# Patient Record
Sex: Male | Born: 1955 | Race: White | Hispanic: No | Marital: Married | Smoking: Current every day smoker
Health system: Southern US, Community
[De-identification: ages and names within clinical notes are randomized; demographics above are authoritative.]

## PROBLEM LIST (undated history)

## (undated) DIAGNOSIS — IMO0001 Reserved for inherently not codable concepts without codable children: Secondary | ICD-10-CM

## (undated) DIAGNOSIS — K08109 Complete loss of teeth, unspecified cause, unspecified class: Secondary | ICD-10-CM

## (undated) DIAGNOSIS — I219 Acute myocardial infarction, unspecified: Secondary | ICD-10-CM

## (undated) DIAGNOSIS — I10 Essential (primary) hypertension: Secondary | ICD-10-CM

## (undated) DIAGNOSIS — Z972 Presence of dental prosthetic device (complete) (partial): Secondary | ICD-10-CM

## (undated) DIAGNOSIS — I639 Cerebral infarction, unspecified: Secondary | ICD-10-CM

## (undated) DIAGNOSIS — I2510 Atherosclerotic heart disease of native coronary artery without angina pectoris: Secondary | ICD-10-CM

## (undated) DIAGNOSIS — Z66 Do not resuscitate: Secondary | ICD-10-CM

## (undated) DIAGNOSIS — J449 Chronic obstructive pulmonary disease, unspecified: Secondary | ICD-10-CM

## (undated) DIAGNOSIS — Z5112 Encounter for antineoplastic immunotherapy: Secondary | ICD-10-CM

## (undated) DIAGNOSIS — R911 Solitary pulmonary nodule: Secondary | ICD-10-CM

## (undated) HISTORY — PX: LUNG SURGERY: SHX703

## (undated) HISTORY — DX: Encounter for antineoplastic immunotherapy: Z51.12

## (undated) HISTORY — PX: CAROTID ENDARTERECTOMY: SUR193

## (undated) HISTORY — DX: Acute myocardial infarction, unspecified: I21.9

## (undated) HISTORY — DX: Do not resuscitate: Z66

## (undated) HISTORY — DX: Solitary pulmonary nodule: R91.1

## (undated) HISTORY — PX: ANGIOPLASTY: SHX39

---

## 1989-04-21 DIAGNOSIS — I219 Acute myocardial infarction, unspecified: Secondary | ICD-10-CM

## 1989-04-21 HISTORY — DX: Acute myocardial infarction, unspecified: I21.9

## 2000-05-07 ENCOUNTER — Ambulatory Visit (HOSPITAL_COMMUNITY): Admission: RE | Admit: 2000-05-07 | Discharge: 2000-05-07 | Payer: Self-pay | Admitting: *Deleted

## 2005-04-07 ENCOUNTER — Ambulatory Visit (HOSPITAL_COMMUNITY): Admission: RE | Admit: 2005-04-07 | Discharge: 2005-04-07 | Payer: Self-pay | Admitting: Family Medicine

## 2005-04-09 ENCOUNTER — Ambulatory Visit: Payer: Self-pay | Admitting: Internal Medicine

## 2005-05-28 ENCOUNTER — Ambulatory Visit: Payer: Self-pay | Admitting: Internal Medicine

## 2005-06-30 ENCOUNTER — Encounter (INDEPENDENT_AMBULATORY_CARE_PROVIDER_SITE_OTHER): Payer: Self-pay | Admitting: Specialist

## 2005-06-30 ENCOUNTER — Inpatient Hospital Stay (HOSPITAL_COMMUNITY): Admission: RE | Admit: 2005-06-30 | Discharge: 2005-07-03 | Payer: Self-pay | Admitting: Thoracic Surgery

## 2005-07-09 ENCOUNTER — Encounter: Admission: RE | Admit: 2005-07-09 | Discharge: 2005-07-09 | Payer: Self-pay | Admitting: Thoracic Surgery

## 2005-07-16 ENCOUNTER — Encounter: Admission: RE | Admit: 2005-07-16 | Discharge: 2005-07-16 | Payer: Self-pay | Admitting: Thoracic Surgery

## 2005-07-23 ENCOUNTER — Encounter: Admission: RE | Admit: 2005-07-23 | Discharge: 2005-07-23 | Payer: Self-pay | Admitting: Thoracic Surgery

## 2005-09-03 ENCOUNTER — Encounter: Admission: RE | Admit: 2005-09-03 | Discharge: 2005-09-03 | Payer: Self-pay | Admitting: Thoracic Surgery

## 2005-11-26 ENCOUNTER — Encounter: Admission: RE | Admit: 2005-11-26 | Discharge: 2005-11-26 | Payer: Self-pay | Admitting: Thoracic Surgery

## 2010-01-17 ENCOUNTER — Encounter: Payer: Self-pay | Admitting: Internal Medicine

## 2010-01-25 ENCOUNTER — Encounter: Admission: RE | Admit: 2010-01-25 | Discharge: 2010-01-25 | Payer: Self-pay | Admitting: Family Medicine

## 2010-02-11 DIAGNOSIS — G459 Transient cerebral ischemic attack, unspecified: Secondary | ICD-10-CM | POA: Insufficient documentation

## 2010-02-11 DIAGNOSIS — I10 Essential (primary) hypertension: Secondary | ICD-10-CM

## 2010-02-11 DIAGNOSIS — I2510 Atherosclerotic heart disease of native coronary artery without angina pectoris: Secondary | ICD-10-CM | POA: Insufficient documentation

## 2010-02-11 DIAGNOSIS — E119 Type 2 diabetes mellitus without complications: Secondary | ICD-10-CM | POA: Insufficient documentation

## 2010-02-12 ENCOUNTER — Ambulatory Visit: Payer: Self-pay | Admitting: Internal Medicine

## 2010-02-12 DIAGNOSIS — J449 Chronic obstructive pulmonary disease, unspecified: Secondary | ICD-10-CM | POA: Insufficient documentation

## 2010-02-12 DIAGNOSIS — F17200 Nicotine dependence, unspecified, uncomplicated: Secondary | ICD-10-CM | POA: Insufficient documentation

## 2010-02-12 DIAGNOSIS — R930 Abnormal findings on diagnostic imaging of skull and head, not elsewhere classified: Secondary | ICD-10-CM | POA: Insufficient documentation

## 2010-02-12 DIAGNOSIS — E785 Hyperlipidemia, unspecified: Secondary | ICD-10-CM

## 2010-02-12 DIAGNOSIS — R911 Solitary pulmonary nodule: Secondary | ICD-10-CM | POA: Insufficient documentation

## 2010-02-12 DIAGNOSIS — I6789 Other cerebrovascular disease: Secondary | ICD-10-CM | POA: Insufficient documentation

## 2010-02-27 ENCOUNTER — Encounter: Payer: Self-pay | Admitting: Cardiovascular Disease

## 2010-03-08 ENCOUNTER — Ambulatory Visit: Payer: Self-pay | Admitting: Cardiovascular Disease

## 2010-03-08 ENCOUNTER — Encounter: Payer: Self-pay | Admitting: Cardiology

## 2010-03-08 DIAGNOSIS — M79609 Pain in unspecified limb: Secondary | ICD-10-CM | POA: Insufficient documentation

## 2010-03-08 DIAGNOSIS — R9439 Abnormal result of other cardiovascular function study: Secondary | ICD-10-CM | POA: Insufficient documentation

## 2010-03-08 DIAGNOSIS — R55 Syncope and collapse: Secondary | ICD-10-CM

## 2010-03-08 DIAGNOSIS — I6529 Occlusion and stenosis of unspecified carotid artery: Secondary | ICD-10-CM

## 2010-03-08 DIAGNOSIS — I2510 Atherosclerotic heart disease of native coronary artery without angina pectoris: Secondary | ICD-10-CM

## 2010-03-12 ENCOUNTER — Encounter: Payer: Self-pay | Admitting: Cardiovascular Disease

## 2010-03-26 ENCOUNTER — Telehealth (INDEPENDENT_AMBULATORY_CARE_PROVIDER_SITE_OTHER): Payer: Self-pay | Admitting: *Deleted

## 2010-03-27 ENCOUNTER — Encounter: Payer: Self-pay | Admitting: Cardiology

## 2010-03-27 ENCOUNTER — Encounter (HOSPITAL_COMMUNITY)
Admission: RE | Admit: 2010-03-27 | Discharge: 2010-05-21 | Payer: Self-pay | Source: Home / Self Care | Attending: Cardiovascular Disease | Admitting: Cardiovascular Disease

## 2010-03-27 ENCOUNTER — Encounter: Payer: Self-pay | Admitting: *Deleted

## 2010-03-27 ENCOUNTER — Ambulatory Visit: Payer: Self-pay

## 2010-03-27 ENCOUNTER — Encounter: Payer: Self-pay | Admitting: Cardiovascular Disease

## 2010-03-29 ENCOUNTER — Telehealth: Payer: Self-pay | Admitting: Cardiovascular Disease

## 2010-04-08 ENCOUNTER — Ambulatory Visit: Payer: Self-pay | Admitting: Cardiovascular Disease

## 2010-05-11 ENCOUNTER — Encounter: Payer: Self-pay | Admitting: Family Medicine

## 2010-05-12 ENCOUNTER — Encounter: Payer: Self-pay | Admitting: Thoracic Surgery

## 2010-05-21 NOTE — Assessment & Plan Note (Signed)
Summary: Pulmonary/ new pt eval copd, still smoking > try spiriva    Copy to:  Dr. Vernon Prey Primary Provider/Referring Provider:  Dr. Vernon Prey  CC:  SOB.[facility]  History of Present Illness: 55 yowm still smoking c/o  tired after walk about a quarter of a mile  February 12, 2010  1st pulmonary office eval cc indolent onset persistent/ progressive but mild doe x 2 years but not limiting  in terms of adls and not using inhalers at all or having freqent flares of sob /cough or noct c/os.  Pt denies any significant sore throat, dysphagia, itching, sneezing,  nasal congestion or excess secretions,  fever, chills, sweats, unintended wt loss, pleuritic or exertional cp, hempoptysis, change in activity tolerance  orthopnea pnd or leg swelling Pt also denies any obvious fluctuation in symptoms with weather or environmental change or other alleviating or aggravating factors.       Current Medications (verified): 1)  Lipitor 40 Mg Tabs (Atorvastatin Calcium) .... 1/2 Once Daily 2)  Diovan 320 Mg Tabs (Valsartan) .... 1/2 Once Daily 3)  Aspir-Low 81 Mg Tbec (Aspirin) .... Take 1 Tablet By Mouth Once A Day 4)  Vitamin D3 50000 Unit Caps (Cholecalciferol) .... Once A Week  Allergies (verified): No Known Drug Allergies  Past History:  Past Medical History: C V A / Stroke Hyperlipidemia Hypertension COPD ...................................................Adam KitchenWert     - PFT's 05/28/05 FEV1 2.09 (66%) with ratio of 69 and no better p B2, with DLC0 69% = GOLD II     - Trial of spiriva February 12, 2010   Past Surgical History: Bowel surgery 1990 Left Disc surgery 1992 Left VATS and Left Lingulectomy March 2007- Per Dr Edwyna Shell    -  1. LEFT LINGULA, WEDGE BIOPSY: FOCAL INFLAMMATION ASSOCIATED   WITH FIBROSIS AND ABSCESS.  Family History:  Mother- she died age 73 with MI, lung cancer Father-asthma, emphysema, heart disease  Social History: Current smoker x 27yrs 2ppd ETOH  daily Married Soil scientist  Review of Systems       The patient complains of shortness of breath with activity, productive cough, tooth/dental problems, headaches, sneezing, and joint stiffness or pain.  The patient denies shortness of breath at rest, non-productive cough, coughing up blood, chest pain, irregular heartbeats, acid heartburn, indigestion, loss of appetite, weight change, abdominal pain, difficulty swallowing, sore throat, nasal congestion/difficulty breathing through nose, itching, ear ache, anxiety, depression, hand/feet swelling, rash, change in color of mucus, and fever.    Vital Signs:  Patient profile:   55 year old male Height:      69 inches Weight:      116 pounds BMI:     17.19 O2 Sat:      93 % on Room air Temp:     98.4 degrees F oral Pulse rate:   98 / minute BP sitting:   128 / 80  (right arm) Cuff size:   regular  Vitals Entered By: Carron Curie CMA (February 12, 2010 10:57 AM)  O2 Flow:  Room air  Physical Exam  Additional Exam:  wt 116 February 12, 2010 Thin chonically ill appearing ambulatory wm nad      HEENT mild turbinate edema.  Oropharynx no thrush or excess pnd or cobblestoning.  No JVD or cervical adenopathy. Mild accessory muscle hypertrophy. Trachea midline, nl thryroid. Chest was hyperinflated by percussion with diminished breath sounds and moderate increased exp time without wheeze. Hoover sign positive at mid inspiration. Regular rate and rhythm without murmur  gallop or rub or increase P2 or edema.  Abd: no hsm, nl excursion. Ext warm without cyanosis or clubbing.     CXR  Procedure date:  01/17/2010  Findings:      Chronic changes c/w ild, no acute / active dz  Impression & Recommendations:  Problem # 1:  COPD UNSPECIFIED (ICD-496)  GOLD II severity by previous pft's and likely progressive  Active smoking greatest concern, see problem#2  I spent extra time with the patient today explaining optimal dpi  technique.  This improved  from  75 - 90% p coaching     Problem # 2:  SMOKER (ICD-305.1)  I took this opportunity to educate the patient regarding the consequences of smoking in airway disorders based on all the data we have from the multiple national lung health studies indicating that smoking cessation, not choice of inhalers or physicians, is the most important aspect of care.     I emphasized that although we never turn away smokers from the pulmonary clinic, we do ask that they understand that the recommendations that were made won't work nearly as well in the presence of continued cigarette exposure and we may reach a point where we can't help the patient if he/she can't quit smoking.       Problem # 3:  ABNORMAL LUNG XRAY (ICD-793.1)  Previous bx reviewed, all this was inflammatory ? related to smoking or infection, no further w/u needed  Orders: New Patient Level V ([address]109)  Medications Added to Medication List This Visit: 1)  Aspir-low 81 Mg Tbec (Aspirin) .... Take 1 tablet by mouth once a day 2)  Vitamin D3 50000 Unit Caps (Cholecalciferol) .... Once a week 3)  Spiriva Handihaler 18 Mcg Caps (Tiotropium bromide monohydrate) .... Two puffs in handihaler daily  Patient Instructions: 1)  Spiriva 1 capsule each am - work on smooth deep breaths in 2)  If improved after 10 day sample fill the prescription. 3)  If not better stop spiriva and make appointment to see me in [place] with pft's the same day 4)  Consider committ to quit Prescriptions: SPIRIVA HANDIHALER 18 MCG  CAPS (TIOTROPIUM BROMIDE MONOHYDRATE) Two puffs in handihaler daily  #30 x 11   Entered and Authorized by:   Nyoka Cowden MD   Signed by:   Nyoka Cowden MD on 02/12/2010   Method used:   Electronically to        [facility] [phone]* (retail)       [address] [place], [place]  [address]       Ph: [phone] or [phone]       Fax: [phone]   RxID:    [id number]    Immunization History:  Influenza Immunization History:    Influenza:  historical (12/31/2009)

## 2010-05-21 NOTE — Assessment & Plan Note (Signed)
Summary: abnormal ett/mt   Visit Type:  new pt Referring Provider:  Dr. Vernon Prey Primary Provider:  Dr. Vernon Prey  CC:  shortness of breath and dizziness.  History of Present Illness: 55 yo WM with history of CAD s/p angioplasty in 1990s (? vessel), carotid artery disease, HTN, hyperlipidemia, CVA, COPD and ongoing tobacco abuse referred today for cardiac evaluation. He recently underwent a screening treadmill exercise stress test in Dr. Buel Ream office. He exercised for 5.5 minutes and had no chest pain. There was an IVCD with exercise. He is referred here today for furtther evaluation. He tells me that he has had no chest pain or SOB but he did have a syncopal episode last week.  He also tells me that Dr. Juanda Chance "pushed a blockage out of my heart artery" in the 90's.  I see no record of this in the computer record.   He also describes pain in both calf muscles with ambulation. Resolves with rest. He has prior right carotid enarterectomy per Dr. Hart Rochester. He had carotid dopplers last month per the patient.   Current Medications (verified): 1)  Lipitor 40 Mg Tabs (Atorvastatin Calcium) .... 1/2 Once Daily 2)  Diovan 320 Mg Tabs (Valsartan) .... 1/2 Once Daily 3)  Aspir-Low 81 Mg Tbec (Aspirin) .... Take 1 Tablet By Mouth Once A Day 4)  Vitamin D3 50000 Unit Caps (Cholecalciferol) .... Once A Week 5)  Spiriva Handihaler 18 Mcg  Caps (Tiotropium Bromide Monohydrate) .... Two Puffs in Handihaler Daily  Allergies (verified): No Known Drug Allergies  Past History:  Past Medical History: CVA in the 90s Hyperlipidemia Hypertension COPD ...................................................Marland KitchenWert     - PFT's 05/28/05 FEV1 2.09 (66%) with ratio of 69 and no better p B2, with DLC0 69% = GOLD II     - Trial of spiriva February 12, 2010  Tobacco  CAD-? angioplasty by Dr. Juanda Chance in 90s but no record of this in computer  Past Surgical History: Bowel surgery 1990 Left Disc surgery 1992 Left VATS and  Left Lingulectomy March 2007- Per Dr Edwyna Shell    -  1. LEFT LINGULA, WEDGE BIOPSY: FOCAL INFLAMMATION ASSOCIATED   WITH FIBROSIS AND ABSCESS. Right carotid endarterectomy  Family History: Reviewed history from 02/12/2010 and no changes required.  Mother- died age 56 with CVA, lung cancer Father-deceased, ? MI 2 brothers dead-one killed  by tree and one in car wreck 2 sisters alive. One with cancer.   Social History: Reviewed history from 02/12/2010 and no changes required. Current smoker x 38 yrs- 2ppd ETOH daily-4 beers per day Married, no children Logger No illicit drug use  Review of Systems       The patient complains of fainting and dizziness.  The patient denies fatigue, malaise, fever, weight gain/loss, vision loss, decreased hearing, hoarseness, chest pain, palpitations, shortness of breath, prolonged cough, wheezing, sleep apnea, coughing up blood, abdominal pain, blood in stool, nausea, vomiting, diarrhea, heartburn, incontinence, blood in urine, muscle weakness, joint pain, leg swelling, rash, skin lesions, headache, depression, anxiety, enlarged lymph nodes, easy bruising or bleeding, and environmental allergies.    Vital Signs:  Patient profile:   55 year old male Height:      69 inches Weight:      112.50 pounds BMI:     16.67 Pulse rate:   103 / minute BP sitting:   159 / 97  (left arm) Cuff size:   regular  Vitals Entered By: Caralee Ates CMA (March 08, 2010 10:20 AM)  Physical Exam  General:  General: Well developed, well nourished, NAD HEENT: OP clear, mucus membranes moist SKIN: warm, dry Neuro: No focal deficits Musculoskeletal: Muscle strength 5/5 all ext Psychiatric: Mood and affect normal Neck: No JVD, no carotid bruits, no thyromegaly, no lymphadenopathy. Lungs:Clear bilaterally, no wheezes, rhonci, crackles CV: RRR no murmurs, gallops rubs Abdomen: soft, NT, ND, BS present Extremities: No edema, pulses non-palpable bilateral DP/PT.      EKG  Procedure date:  03/08/2010  Findings:      NSR, rate 76 bpm. Normal EKG  Impression & Recommendations:  Problem # 1:  CAD, NATIVE VESSEL (ICD-414.01) ? prior CAD. Will attempt to get records. He has had no chest pain but recent sycnopal episodes and abnormal exercise stress test. Will arrange exercise stress myoview to exclude ischemia.   His updated medication list for this problem includes:    Aspir-low 81 Mg Tbec (Aspirin) .Marland Kitchen... Take 1 tablet by mouth once a day  Problem # 2:  SYNCOPE AND COLLAPSE (ICD-780.2) See above. Recent f/u carotid dopplers per Dr. Hart [place]. Will get stress test to exclude ischemia.   His updated medication list for this problem includes:    Aspir-low 81 Mg Tbec (Aspirin) .Marland Kitchen... Take 1 tablet by mouth once a day  Problem # 3:  CAROTID ARTERY STENOSIS, WITHOUT INFARCTION (ICD-433.10) Per [facility], Dr. Hart Rochester.   His updated medication list for this problem includes:    Aspir-low 81 Mg Tbec (Aspirin) .Marland Kitchen... Take 1 tablet by mouth once a day  Problem # 4:  LEG PAIN (ICD-729.5) Will order ABI.   Orders: Arterial Duplex Lower Extremity (Arterial Duplex Low)  Problem # 5:  SMOKER (ICD-305.1) Smoking cessation encouraged.   Problem # 6:  ABNORMAL CV (STRESS) TEST (ICD-794.39) See above.   Other Orders: EKG w/ Interpretation (93000) Nuclear Stress Test (Nuc Stress Test)  Patient Instructions: 1)  Your physician recommends that you schedule a follow-up appointment in: 3-4 weeks. 2)  Your physician recommends that you continue on your current medications as directed. Please refer to the Current Medication list given to you today. 3)  Your physician has requested that you have an ankle brachial index (ABI). During this test an ultrasound and blood pressure cuff are used to evaluate the arteries that supply the arms and legs with blood. Allow thirty minutes for this exam. There are no restrictions or special instructions. 4)  Your physician has  requested that you have an exercise stress myoview.  For further information please visit [email or web address]  Please follow instruction sheet, as given.

## 2010-05-21 NOTE — Progress Notes (Signed)
Summary: nuc pre procedure  Phone Note Outgoing Call Call back at Home Phone [phone]   Call placed by: Cathlyn Parsons RN,  March 26, 2010 4:26 PM Call placed to: Patient Summary of Call: Reviewed information on Myoview Information Sheet (see scanned document for further details).  Spoke with patient.      Nuclear Med Background Indications for Stress Test: Evaluation for Ischemia  Indications Comments: Recent Abn stress test  History: Angioplasty, COPD, GXT  History Comments: 90 PTCA Recent GXT-IVCD with exercise abn at Dr.Moore  Symptoms: Dizziness, DOE, Syncope    Nuclear Pre-Procedure Cardiac Risk Factors: Carotid Disease, CVA, Family History - CAD, Hypertension, Lipids, NIDDM, PVD, Smoker Height (in): 69   Appended Document: Cardiology Nuclear Testing Normal nuclear study. Can we call him? thanks, chris  Appended Document: Cardiology Nuclear Testing LVMTCB

## 2010-05-21 NOTE — Progress Notes (Signed)
Summary: pt rtn call  Phone Note Call from Patient Call back at Home Phone [phone]   Caller: Patient Reason for Call: Talk to Nurse, Talk to Doctor, Lab or Test Results Summary of Call: pt rtning your call to get test results Initial call taken by: Omer Jack,  March 29, 2010 11:34 AM  Follow-up for Phone Call        pt aware of test results. Whitney Maeola Sarah RN  March 29, 2010 12:13 PM  Follow-up by: Whitney Maeola Sarah RN,  March 29, 2010 12:13 PM

## 2010-05-21 NOTE — Letter (Signed)
Summary: [facility]   Imported By: Marylou Mccoy 03/20/2010 11:56:20  _____________________________________________________________________  External Attachment:    Type:   Image     Comment:   External Document

## 2010-05-23 NOTE — Assessment & Plan Note (Signed)
Summary: Cardiology Nuclear Testing  Nuclear Med Background Indications for Stress Test: Evaluation for Ischemia, PTCA Patency  Indications Comments: Abnormal GXT  History: Angioplasty, COPD, GXT, Heart Catheterization  History Comments: '90 PTCA; 02/27/10 GXTabnormal, VCD with exercise per [facility]; h/o valvular dz.  Symptoms: Chest Pain, Chest Pain with Exertion, Dizziness, DOE, Fatigue, Rapid HR, Syncope  Symptoms Comments: Last episode of CP:2 weeks ago.   Nuclear Pre-Procedure Cardiac Risk Factors: Carotid Disease, CVA, Family History - CAD, Hypertension, Lipids, NIDDM, PVD, Smoker Caffeine/Decaff Intake: None NPO After: 8:00 PM Lungs: Clear.  O2 Sat 100% on RA. IV 0.9% NS with Angio Cath: 20g     IV Site: R Antecubital IV Started by: Irean Hong, RN Chest Size (in) 36     Height (in): 69 Weight (lb): 109 BMI: 16.15 Tech Comments: a.m. medications taken  Nuclear Med Study 1 or 2 day study:  1 day     Stress Test Type:  Stress Reading MD:  Cassell Clement, MD     Referring MD:  Verne Carrow, MD Resting Radionuclide:  Technetium 80m Tetrofosmin     Resting Radionuclide Dose:  11 mCi  Stress Radionuclide:  Technetium 52m Tetrofosmin     Stress Radionuclide Dose:  33 mCi   Stress Protocol Exercise Time (min):  8:00 min     Max HR:  150 bpm     Predicted Max HR:  164 bpm  Max Systolic BP: 218 mm Hg     Percent Max HR:  91.46 %     METS: 8.8 Rate Pressure Product:  21308    Stress Test Technologist:  Rea College, CMA-N     Nuclear Technologist:  Domenic Polite, CNMT  Rest Procedure  Myocardial perfusion imaging was performed at rest 45 minutes following the intravenous administration of Technetium 15m Tetrofosmin.  Stress Procedure  The patient exercised for eight minutes.  The patient stopped due to dyspnea with O2 Sat dropping from 100% to 91% at peak exercise.  He denied any chest pain.  There were no significant ST-T wave changes, only occasional PAC's and  rare PVC.  Technetium 52m Tetrofosmin was injected at peak exercise and myocardial perfusion imaging was performed after a brief delay.  QPS Raw Data Images:  Normal; no motion artifact; normal heart/lung ratio. Stress Images:  Normal homogeneous uptake in all areas of the myocardium. Rest Images:  Normal homogeneous uptake in all areas of the myocardium. Subtraction (SDS):  No evidence of ischemia. Transient Ischemic Dilatation:  .93  (Normal <1.22)  Lung/Heart Ratio:  .27  (Normal <0.45)  Quantitative Gated Spect Images QGS EDV:  67 ml QGS ESV:  29 ml QGS EF:  56 % QGS cine images:  No wall motion abnormalities  Findings Normal nuclear study      Overall Impression  Exercise Capacity: Good exercise capacity. BP Response: Hypertensive blood pressure response. Clinical Symptoms: No chest pain ECG Impression: No significant ST segment change suggestive of ischemia. Overall Impression: Normal stress nuclear study.  Appended Document: Cardiology Nuclear Testing Normal nuclear study. Can we call him? thanks, chris  Appended Document: Cardiology Nuclear Testing LVMTCB  Appended Document: Cardiology Nuclear Testing pt. aware of results.

## 2010-05-23 NOTE — Letter (Signed)
Summary: [facility] Exercise Treadmill Questionnaire  [facility] Exercise Treadmill Questionnaire   Imported By: Roderic Ovens 04/08/2010 11:14:13  _____________________________________________________________________  External Attachment:    Type:   Image     Comment:   External Document

## 2010-05-23 NOTE — Assessment & Plan Note (Signed)
Summary: 1 month rov f/u abi and myoview done 03-27-10/sl   Visit Type:  1 mo f/u Referring Provider:  Dr. Vernon Prey Primary Provider:  Dr. Vernon Prey  CC:  pr states he is just here to get some test results...denies any complaints today.  History of Present Illness: 55 yo WM with history of CAD s/p angioplasty in 1990s (? vessel), carotid artery disease, HTN, hyperlipidemia, CVA, COPD and ongoing tobacco abuse who is here today for cardiac follow up. He was seen one month ago as a new patient for  cardiac evaluation. He had recently undergone a screening treadmill exercise stress test in Dr. Buel Ream office. He exercised for 5.5 minutes and had no chest pain. There was an IVCD with exercise.He told me that he has had no chest pain or SOB but he did have a syncopal episode the week before his first visit in our office.  He also described pain in both calf muscles with ambulation. This  resolves with rest. He has prior right carotid enarterectomy per Dr. Hart Rochester. He had carotid dopplers last month per the patient.   I ordered a stress test and lower extremity dopplers. He is here today to review the results of his tests. His lower extremity dopplers were normal. His stress test showed normal LV systolic function with no ischemia. He exercised for 8 minutes without EKG changes. He has had no chest pain or SOB. He continues to smoke 2 ppd.   Current Medications (verified): 1)  Lipitor 40 Mg Tabs (Atorvastatin Calcium) .... 1/2 Once Daily 2)  Diovan 320 Mg Tabs (Valsartan) .... 1/2 Once Daily 3)  Aspir-Low 81 Mg Tbec (Aspirin) .... Take 1 Tablet By Mouth Once A Day 4)  Vitamin D3 50000 Unit Caps (Cholecalciferol) .... Once A Week 5)  Spiriva Handihaler 18 Mcg  Caps (Tiotropium Bromide Monohydrate) .... Two Puffs in Handihaler Daily  Allergies (verified): No Known Drug Allergies  Past History:  Past Medical History: Reviewed history from 03/08/2010 and no changes required. CVA in the  90s Hyperlipidemia Hypertension COPD ...................................................Marland KitchenWert     - PFT's 05/28/05 FEV1 2.09 (66%) with ratio of 69 and no better p B2, with DLC0 69% = GOLD II     - Trial of spiriva February 12, 2010  Tobacco  CAD-? angioplasty by Dr. Juanda Chance in 90s but no record of this in computer  Social History: Reviewed history from 03/08/2010 and no changes required. Current smoker x 38 yrs- 2ppd ETOH daily-4 beers per day Married, no children Logger No illicit drug use  Review of Systems  The patient denies fatigue, malaise, fever, weight gain/loss, vision loss, decreased hearing, hoarseness, chest pain, palpitations, shortness of breath, prolonged cough, wheezing, sleep apnea, coughing up blood, abdominal pain, blood in stool, nausea, vomiting, diarrhea, heartburn, incontinence, blood in urine, muscle weakness, joint pain, leg swelling, rash, skin lesions, headache, fainting, dizziness, depression, anxiety, enlarged lymph nodes, easy bruising or bleeding, and environmental allergies.    Vital Signs:  Patient profile:   55 year old male Height:      69 inches Weight:      114.75 pounds BMI:     17.01 Pulse rate:   96 / minute Pulse rhythm:   regular BP sitting:   118 / 86  (left arm) Cuff size:   regular  Vitals Entered By: Danielle Rankin, CMA (April 08, 2010 8:39 AM)  Physical Exam  General:  General: Well developed, well nourished, NAD Musculoskeletal: Muscle strength 5/5 all ext Psychiatric:  Mood and affect normal Neck: No JVD, no carotid bruits, no thyromegaly, no lymphadenopathy. Lungs:Clear bilaterally, no wheezes, rhonci, crackles CV: RRR no murmurs, gallops rubs Abdomen: soft, NT, ND, BS present Extremities: No edema, pulses 2+.    Nuclear ETT  Procedure date:  03/27/2010  Findings:      Stress Procedure   The patient exercised for eight minutes.  The patient stopped due to dyspnea with O2 Sat dropping from 100% to 91% at peak exercise.   He denied any chest pain.  There were no significant ST-T wave changes, only occasional PAC's and rare PVC.  Technetium 48m Tetrofosmin was injected at peak exercise and myocardial perfusion imaging was performed after a brief delay.  QPS  Raw Data Images:  Normal; no motion artifact; normal heart/lung ratio. Stress Images:  Normal homogeneous uptake in all areas of the myocardium. Rest Images:  Normal homogeneous uptake in all areas of the myocardium. Subtraction (SDS):  No evidence of ischemia. Transient Ischemic Dilatation:  .93  (Normal <1.22)  Lung/Heart Ratio:  .27  (Normal <0.45)  Quantitative Gated Spect Images  QGS EDV:  67 ml QGS ESV:  29 ml QGS EF:  56 % QGS cine images:  No wall motion abnormalities  Findings  Normal nuclear study   Overall Impression   Exercise Capacity: Good exercise capacity. BP Response: Hypertensive blood pressure response. Clinical Symptoms: No chest pain ECG Impression: No significant ST segment change suggestive of ischemia. Overall Impression: Normal stress nuclear study.  Arterial Doppler  Procedure date:  03/27/2010  Findings:      Brisk triphasic waveforms bilaterally. Normal ABI bilaterally.   Impression & Recommendations:  Problem # 1:  CAD, NATIVE VESSEL (ICD-414.01) Reported history of CAD. No chest pain. Recent stress myoview without ischemia. Continue ASA and statin. He is asked to stop smoking. His arterial dopplers are normal.   His updated medication list for this problem includes:    Aspir-low 81 Mg Tbec (Aspirin) .Marland Kitchen... Take 1 tablet by mouth once a day  Patient Instructions: 1)  Your physician recommends that you schedule a follow-up appointment in: As needed.

## 2010-05-23 NOTE — Consult Note (Signed)
Summary: [facility]   [facility] Referral Request   Imported By: Roderic Ovens 04/08/2010 11:19:50  _____________________________________________________________________  External Attachment:    Type:   Image     Comment:   External Document

## 2010-05-23 NOTE — Miscellaneous (Signed)
Summary: [facility] Exercise ECG   [facility] Exercise ECG   Imported By: Roderic Ovens 04/08/2010 11:15:12  _____________________________________________________________________  External Attachment:    Type:   Image     Comment:   External Document

## 2010-06-12 LAB — FECAL OCCULT BLOOD, GUAIAC: Fecal Occult Blood: NEGATIVE

## 2010-08-12 ENCOUNTER — Encounter: Payer: Self-pay | Admitting: Family Medicine

## 2010-09-06 NOTE — H&P (Signed)
NAMEKASON, BENAK NO.:  [id number]   MEDICAL RECORD NO.:  [id number]          PATIENT TYPE:  INP   LOCATION:  2899                         FACILITY:  [facility]   PHYSICIAN:  Ines Bloomer, M.D. DATE OF BIRTH:  [birth year]   DATE OF ADMISSION:  06/30/2005  DATE OF DISCHARGE:                                HISTORY & PHYSICAL   CHIEF COMPLAINT:  Lung mass.   HISTORY OF PRESENT ILLNESS:  This is a 55 year old patient who has been a  long time active smoker. He is presently trying to quit, but has a long  history of tobacco associated disease. He now smokes about 1/2 pack of  cigarettes a day. He has had increasing shortness of breath and says his  legs were giving out. His pulmonary function test showed an FPV of 3.04 with  an FEV1 of 2.09 and diffuse capacity of 69%. A chest x-ray showed multiple  small nodules, but had one in the lateral aspect of the left upper lobe that  increased in size. He had a PET scan done that showed a mild increase in  activity of 2.1, felt that this could be an early cancer or inflammatory  disease. He had no pleuritic pain, hemoptysis, fever, or chills. He is  admitted for biopsy of his left lung mass.   His past medical history is significant for diabetes, hypertension. He had  previous bowel surgery in 1990 and left disc surgery in 1992.   There are no allergies.   Medications include aspirin, Lipitor of 30 mg a day and Lotrel 5 mg a day.   He also has had a previous stent done and had transient ischemic attack for  which he had left subclavian carotid stenosis and anastomosis by Dr. Hart Rochester  in 1995.   Family history is positive for cardiac disease. His dad died in 60. Mother  died at age 16 of myocardial infarction. He has got a sister with diabetes,  but is negative for cancer.   Social history he is married and smokes 1/2 pack of cigarettes a day. He  says he drinks up to a 6 pack of beer a day.   On review of systems  he is 126 pounds, he is 5'5. CARDIAC: See history of  present illness. No present angina or atrial fibrillation. PULMONARY: See  history of present illness. No hemoptysis or asthma. GI: No hiatal hernia,  dysphagia, nausea, vomiting, constipation. GU: No dysuria, frequent  urination, or kidney disease. VASCULAR: He has mild claudication on  ambulation. No history of DVT. TIA's: He has had the previous carotid  subclavian bypass. NEUROLOGICAL: No headaches, blackouts, or seizures.  __________. He has no arthritis or muscular pains. PSYCHIATRIC: No  psychiatric illness. EENT: No change in his eyesight or hearing.  HEMATOLOGICAL: No problems with anemia.   PHYSICAL EXAMINATION:  GENERAL: He is a well-developed Caucasian male in no  acute distress.  HEAD: Atraumatic.  EYES: Pupils are equal, round, and reactive to light and accommodation,  extraocular movements are normal.  EARS: Tympanic membranes are intact.  NOSE:  No septal deviation.  THROAT: Without lesion.  NECK: He has a left carotid subclavian surgical scar.  CHEST: Bilateral wheezes.  HEART: Regular sinus rhythm, no murmurs heard.  ABDOMEN: Soft. There is no hepatosplenomegaly. Bowel sounds are normal.  EXTREMITIES: Pulses are 2+. There is no clubbing or edema.  NEUROLOGICAL: He is oriented x3. Cranial nerves II through XII were intact.  Sensory and motor intact.  SKIN: Without lesions.   IMPRESSION:  1.  Enlarging left upper lobe lesion with multiple other small pulmonary      nodules.  2.  History of tobacco abuse.  3.  History of transient ischemic attacks.  4.  History of coronary artery disease.  5.  History of valvular heart disease.  6.  Hyperlipidemia.  7.  Diabetes mellitus type 2.           ______________________________  Ines Bloomer, M.D.     DPB/MEDQ  D:  06/29/2005  T:  06/30/2005  Job:  [id number]

## 2010-09-06 NOTE — Discharge Summary (Signed)
Adam Warner, FAWVER NO.:  [id number]   MEDICAL RECORD NO.:  [id number]          PATIENT TYPE:  INP   LOCATION:  3310                         FACILITY:  [facility]   PHYSICIAN:  Ines Bloomer, M.D. DATE OF BIRTH:  March 23, 1956   DATE OF ADMISSION:  06/30/2005  DATE OF DISCHARGE:  07/03/2005                                 DISCHARGE SUMMARY   HISTORY OF PRESENT ILLNESS:  The patient is a 55 year old long-term smoker  who was referred to Dr. Edwyna Shell due to a left lung mass. The patient had a  chest x-ray which revealed multiple nodules, but the one in the lateral  aspect of the left upper lobe was felt to have increased in size. He had a  PET scan that showed mildly increase of activity with 2.1 SUV.  Due to this  findings it was felt by Dr. Edwyna Shell that the patient could possibly have an  early inflammatory disease. He was asymptomatic from these findings. He was  admitted for biopsy.   PAST MEDICAL HISTORY:  1.  Diabetes.  2.  Hypertension.  3.  Previous bowel surgery.  4.  Previous back surgery.   ALLERGIES:  No known allergies.   MEDICATIONS:  1.  Lipitor 30 mg daily.  2.  Lotrel 5 mg daily.   PAST SURGICAL HISTORY:  Stent placed in the left subclavian artery by Dr.  Hart Rochester.   For family history, social history, review of symptoms, and physical exam,  please see history and physical done at the time of admission.   HOSPITAL COURSE:  The patient was admitted electively and on June 30, 2005,  taken to the operating room where he underwent left video-assisted  thoracoscopic surgery with lingulectomy. The patient tolerated the procedure  well, was taken to the postanesthesia care unit in stable condition.   POSTOPERATIVE HOSPITAL COURSE:  The patient has done well. Pathologic  findings from the left lingula showed focal inflammation associated with  fibrosis and abscesses. No malignancy was identified. He had several other  lymph nodes evaluated during the  procedure at level 11L which only showed  anthracotic lymph nodes with no tumor. From my view point the patient has  responded quite nicely. His incision healed well without evidence of  infection. He tolerated a rapid vast amount of activity commensurate for a  level of postoperative convalescence using routine protocols. Routine lines,  monitors, and drainage were discontinued all in a standard fashion. He was  deemed acceptable for discharge on July 03, 2005.   CONDITION ON DISCHARGE:  Stable and improved.   FINAL DIAGNOSIS:  Pathology as noted above regarding left lingula specimen.   OTHER DIAGNOSES:  1.  Hypertension.  2.  Previous bowel surgery.  3.  History of back surgery.  4.  History of transient ischemic attack with subclavian stent placement.  5.  History of tobacco abuse.  6.  History of coronary disease.  7.  History of valvular heart disease.   INSTRUCTIONS:  The patient received written instructions in regards to  medications, activity, diet, wound care, and follow-up.  MEDICATIONS ON DISCHARGE:  As preoperatively and additionally for pain Tylox  one to two q.6h.   FOLLOWUP:  With Dr. Edwyna Shell on Wednesday, March 21st at 12:40 with a chest x-  ray.      Rowe Clack, P.A.-C.    ______________________________  Ines Bloomer, M.D.    Sherryll Burger  D:  08/13/2005  T:  08/14/2005  Job:  [id number]

## 2010-09-06 NOTE — Op Note (Signed)
NAMEEVAN, MACKIE NO.:  [id number]   MEDICAL RECORD NO.:  [id number]          PATIENT TYPE:  INP   LOCATION:  3310                         FACILITY:  [facility]   PHYSICIAN:  Ines Bloomer, M.D. DATE OF BIRTH:  1955/06/30   DATE OF PROCEDURE:  06/30/2005  DATE OF DISCHARGE:                                 OPERATIVE REPORT   PREOPERATIVE DIAGNOSIS:  Tobacco abuse, left upper lobe mass with positive  on PET scan.   POSTOPERATIVE DIAGNOSIS:  Inflammatory mass, left lingula.   OPERATION PERFORMED:  Left VATS, left lingulectomy.   SURGEON:  Ines Bloomer, M.D.   FIRST ASSISTANT:  Rowe Clack, P.A.-C.   ANESTHESIA:  General.   DESCRIPTION OF PROCEDURE:  After percutaneous insertion of all monitoring  lines, the patient underwent general anesthesia, was prepped and draped in  the usual sterile manner, turned to the left lateral thoracotomy position.  Patient had a large lesion of the left lingula that was thought possibly to  be early cancer on PET scan although the SUV was only 2.1.  He has a long  history of tobacco abuse.  After general anesthesia, prepped and draped in  the usual sterile manner, two trocar sites were made, one in the 7th  intercostal space at the anterior axillary line and one at the 8th  intercostal space at posterior axillary line.  The trocars were inserted.  The lesion could be seen peripherally along the left lingula.  A 3 cm axis  incision was made over the fifth intercostal space at the anterior axillary  line and the lesion was palpated and identified.  Then coming in from the  medial trocar site with the camera being in the inferior trocar site, the  lesion was grasped and pulled medially.  Dissection was started in the  fissure.  Fissure was dissected down to the pulmonary artery, identifying  the lingular branch and then we dissected out the lingular branch and  carefully divided the fissure with the autosuture green  Roticulator.  After  this had been done, it was decided to come across right at the lingular  artery to preserve one branch of the lingular artery as much as possible so  we used a stapler to divide that one branch and then we divided the fissure,  divided the lung right at that area coming across with the EZ-45 stapler and  the Auto Suture stapler.  I thought it was very thick but we were able to  make the division.  One area had to be oversewn with 3-0 Vicryl.  The lung  did re-expand without difficulty.  We got good 2 cm margins on the lesion.  Sent for frozen section and was some type of inflammatory lesion rather than  cancer.  It looked like an abscess.  It was closed.  CoSeal was applied to  the staple line.  Two chest tubes were brought into the trocar sites and  sutured in place.  Marcaine block was done in the usual fashion and the On-Q  catheter was placed in the  usual fashion. The patient was returned to the  recovery room in stable condition.           ______________________________  Ines Bloomer, M.D.     DPB/MEDQ  D:  06/30/2005  T:  07/01/2005  Job:  [id number]   cc:   Casimiro Needle B. Sherene Sires, M.D. [facility]  [address]  [place]  [place] [address]

## 2011-03-07 ENCOUNTER — Other Ambulatory Visit: Payer: Self-pay | Admitting: Family Medicine

## 2011-03-07 DIAGNOSIS — R918 Other nonspecific abnormal finding of lung field: Secondary | ICD-10-CM

## 2011-03-10 ENCOUNTER — Ambulatory Visit
Admission: RE | Admit: 2011-03-10 | Discharge: 2011-03-10 | Disposition: A | Discharge status: Home / Self Care | Payer: Medicaid Other | Source: Ambulatory Visit | Attending: Family Medicine | Admitting: Family Medicine

## 2011-03-10 DIAGNOSIS — R918 Other nonspecific abnormal finding of lung field: Secondary | ICD-10-CM

## 2011-04-20 ENCOUNTER — Emergency Department (HOSPITAL_COMMUNITY)
Admission: EM | Admit: 2011-04-20 | Discharge: 2011-04-21 | Disposition: A | Discharge status: Home / Self Care | Payer: Medicaid Other | Attending: Emergency Medicine | Admitting: Emergency Medicine

## 2011-04-20 ENCOUNTER — Other Ambulatory Visit: Payer: Self-pay

## 2011-04-20 ENCOUNTER — Emergency Department (HOSPITAL_COMMUNITY): Payer: Medicaid Other

## 2011-04-20 DIAGNOSIS — B9789 Other viral agents as the cause of diseases classified elsewhere: Secondary | ICD-10-CM | POA: Insufficient documentation

## 2011-04-20 DIAGNOSIS — Z8673 Personal history of transient ischemic attack (TIA), and cerebral infarction without residual deficits: Secondary | ICD-10-CM | POA: Insufficient documentation

## 2011-04-20 DIAGNOSIS — F17200 Nicotine dependence, unspecified, uncomplicated: Secondary | ICD-10-CM | POA: Insufficient documentation

## 2011-04-20 DIAGNOSIS — E119 Type 2 diabetes mellitus without complications: Secondary | ICD-10-CM | POA: Insufficient documentation

## 2011-04-20 DIAGNOSIS — R079 Chest pain, unspecified: Secondary | ICD-10-CM | POA: Insufficient documentation

## 2011-04-20 DIAGNOSIS — Z7982 Long term (current) use of aspirin: Secondary | ICD-10-CM | POA: Insufficient documentation

## 2011-04-20 DIAGNOSIS — B349 Viral infection, unspecified: Secondary | ICD-10-CM

## 2011-04-20 DIAGNOSIS — J4489 Other specified chronic obstructive pulmonary disease: Secondary | ICD-10-CM | POA: Insufficient documentation

## 2011-04-20 DIAGNOSIS — I4510 Unspecified right bundle-branch block: Secondary | ICD-10-CM | POA: Insufficient documentation

## 2011-04-20 DIAGNOSIS — Z902 Acquired absence of lung [part of]: Secondary | ICD-10-CM | POA: Insufficient documentation

## 2011-04-20 DIAGNOSIS — I10 Essential (primary) hypertension: Secondary | ICD-10-CM | POA: Insufficient documentation

## 2011-04-20 DIAGNOSIS — R059 Cough, unspecified: Secondary | ICD-10-CM | POA: Insufficient documentation

## 2011-04-20 DIAGNOSIS — R05 Cough: Secondary | ICD-10-CM | POA: Insufficient documentation

## 2011-04-20 DIAGNOSIS — I2510 Atherosclerotic heart disease of native coronary artery without angina pectoris: Secondary | ICD-10-CM | POA: Insufficient documentation

## 2011-04-20 DIAGNOSIS — J449 Chronic obstructive pulmonary disease, unspecified: Secondary | ICD-10-CM | POA: Insufficient documentation

## 2011-04-20 HISTORY — DX: Chronic obstructive pulmonary disease, unspecified: J44.9

## 2011-04-20 HISTORY — DX: Essential (primary) hypertension: I10

## 2011-04-20 HISTORY — DX: Cerebral infarction, unspecified: I63.9

## 2011-04-20 HISTORY — DX: Atherosclerotic heart disease of native coronary artery without angina pectoris: I25.10

## 2011-04-20 LAB — COMPREHENSIVE METABOLIC PANEL
ALT: 14 U/L (ref 0–53)
AST: 14 U/L (ref 0–37)
CO2: 23 mEq/L (ref 19–32)
Calcium: 9.3 mg/dL (ref 8.4–10.5)
Chloride: 96 mEq/L (ref 96–112)
Creatinine, Ser: 2.85 mg/dL — ABNORMAL HIGH (ref 0.50–1.35)
GFR calc Af Amer: 27 mL/min — ABNORMAL LOW (ref >90)
GFR calc non Af Amer: 23 mL/min — ABNORMAL LOW (ref >90)
Glucose, Bld: 149 mg/dL — ABNORMAL HIGH (ref 70–99)
Total Bilirubin: 0.4 mg/dL (ref 0.3–1.2)

## 2011-04-20 LAB — URINALYSIS, ROUTINE W REFLEX MICROSCOPIC
Hgb urine dipstick: NEGATIVE
Nitrite: NEGATIVE
Protein, ur: NEGATIVE mg/dL
Urobilinogen, UA: 1 mg/dL (ref 0.0–1.0)

## 2011-04-20 LAB — DIFFERENTIAL
Basophils Absolute: 0 10*3/uL (ref 0.0–0.1)
Eosinophils Relative: 0 % (ref 0–5)
Lymphocytes Relative: 15 % (ref 12–46)
Lymphs Abs: 1.7 10*3/uL (ref 0.7–4.0)
Monocytes Absolute: 0.9 10*3/uL (ref 0.1–1.0)
Neutro Abs: 8.3 10*3/uL — ABNORMAL HIGH (ref 1.7–7.7)

## 2011-04-20 LAB — CBC
HCT: 43.1 % (ref 39.0–52.0)
Hemoglobin: 14.8 g/dL (ref 13.0–17.0)
MCV: 90.5 fL (ref 78.0–100.0)
RBC: 4.76 MIL/uL (ref 4.22–5.81)
RDW: 13.7 % (ref 11.5–15.5)
WBC: 10.9 10*3/uL — ABNORMAL HIGH (ref 4.0–10.5)

## 2011-04-20 MED ORDER — ALBUTEROL SULFATE (5 MG/ML) 0.5% IN NEBU
2.5000 mg | INHALATION_SOLUTION | Freq: Once | RESPIRATORY_TRACT | Status: AC
  Administered 2011-04-20: 2.5 mg via RESPIRATORY_TRACT
  Filled 2011-04-20: qty 1

## 2011-04-20 MED ORDER — SODIUM CHLORIDE 0.9 % IV BOLUS (SEPSIS)
1000.0000 mL | Freq: Once | INTRAVENOUS | Status: AC
  Administered 2011-04-20: 1000 mL via INTRAVENOUS

## 2011-04-20 NOTE — ED Notes (Signed)
Weakness and cough since Thursday, won't eat thinks he may have pneumonia.  Dizziness when standing

## 2011-04-20 NOTE — ED Provider Notes (Signed)
History   This chart was scribed for [employer]. Colon Branch, MD scribed by Magnus Sinning. The patient was seen in room APA02/APA02    CSN: [id number]  Arrival date & time 04/20/11  2051   None     Chief Complaint  Patient presents with  . Weakness  . Cough    (Consider location/radiation/quality/duration/timing/severity/associated sxs/prior treatment) HPI Adam Warner is a 55 y.o. male who presents to the Emergency Department complaining of subjective fever with associated ST, congestion, chills, unproductive cough, fever,nausea but no vomiting onset 3 days ago. Per relative, pt has lost about 10 pounds beginning six months ago and has had part of his lung removed. Dr. Edwyna Shell performed thoracotomy after noticing what the relative explains as "spots on his lungs. She adds that recently pt was told that the "spots" had returned.     PCP: Dr. Christell Constant.  Cardiothoracic Surgeon: Dr. Edwyna Shell in [place]   Pt  is a current smoker, but pt's relative reports that he has not smoked anything since 3 days ago.  Past Medical History  Diagnosis Date  . COPD (chronic obstructive pulmonary disease)   . Stroke   . Hypertension   . Diabetes mellitus   . Coronary artery disease     Past Surgical History  Procedure Date  . Lung surgery     No family history on file.  History  Substance Use Topics  . Smoking status: Current Everyday Smoker    Types: Cigarettes  . Smokeless tobacco: Not on file  . Alcohol Use: Yes     occ      Review of Systems 10 Systems reviewed and are negative for acute change except as noted in the HPI.  Allergies  Zyban  Home Medications   Current Outpatient Rx  Name Route Sig Dispense Refill  . ASPIRIN 81 MG PO TBEC Oral Take 81 mg by mouth daily.      . ATORVASTATIN CALCIUM 40 MG PO TABS Oral Take 40 mg by mouth daily.      Marland Kitchen VALSARTAN 320 MG PO TABS Oral Take 320 mg by mouth daily.        BP 69/44  Pulse 86  Temp(Src) 98.3 F (36.8 C) (Oral)  Resp 28   Ht 5\' 5"  (1.651 m)  Wt 117 lb (53.071 kg)  BMI 19.47 kg/m2  SpO2 96%  Physical Exam  Nursing note and vitals reviewed. Constitutional: He is oriented to person, place, and time. He appears well-developed and well-nourished. No distress.  HENT:  Head: Normocephalic and atraumatic.  Mouth/Throat: Oropharynx is clear and moist.  Eyes: EOM are normal. Pupils are equal, round, and reactive to light.  Neck: Neck supple. No tracheal deviation present.  Cardiovascular: Normal rate, regular rhythm and normal heart sounds.  Exam reveals no gallop and no friction rub.   No murmur heard. Pulmonary/Chest: Effort normal. No respiratory distress. He has rales.       Diffused crackles bilaterally  Abdominal: Soft. Bowel sounds are normal. He exhibits no distension. There is no tenderness.  Musculoskeletal: Normal range of motion. He exhibits no edema.  Neurological: He is alert and oriented to person, place, and time. No sensory deficit.  Skin: Skin is warm and dry.  Psychiatric: He has a normal mood and affect. His behavior is normal.    ED Course  Procedures (including critical care time) DIAGNOSTIC STUDIES: Oxygen Saturation is 96% on room air, normal by my interpretation.     Date: 04/20/2011  2128  Rate:  78  Rhythm: normal sinus rhythm  QRS Axis: normal  Intervals: normal  ST/T Wave abnormalities: normal  Conduction Disutrbances:right bundle branch block  Narrative Interpretation:   Old EKG Reviewed: changes noted c/w 06/26/05 new RBBB   COORDINATION OF CARE: Results for orders placed during the hospital encounter of 04/20/11  CBC      Component Value Range   WBC 10.9 (*) 4.0 - 10.5 (K/uL)   RBC 4.76  4.22 - 5.81 (MIL/uL)   Hemoglobin 14.8  13.0 - 17.0 (g/dL)   HCT 40.9  81.1 - 91.4 (%)   MCV 90.5  78.0 - 100.0 (fL)   MCH 31.1  26.0 - 34.0 (pg)   MCHC 34.3  30.0 - 36.0 (g/dL)   RDW 78.2  95.6 - 21.3 (%)   Platelets 212  150 - 400 (K/uL)  DIFFERENTIAL      Component Value  Range   Neutrophils Relative 76  43 - 77 (%)   Neutro Abs 8.3 (*) 1.7 - 7.7 (K/uL)   Lymphocytes Relative 15  12 - 46 (%)   Lymphs Abs 1.7  0.7 - 4.0 (K/uL)   Monocytes Relative 8  3 - 12 (%)   Monocytes Absolute 0.9  0.1 - 1.0 (K/uL)   Eosinophils Relative 0  0 - 5 (%)   Eosinophils Absolute 0.0  0.0 - 0.7 (K/uL)   Basophils Relative 0  0 - 1 (%)   Basophils Absolute 0.0  0.0 - 0.1 (K/uL)  COMPREHENSIVE METABOLIC PANEL      Component Value Range   Sodium 131 (*) 135 - 145 (mEq/L)   Potassium 4.3  3.5 - 5.1 (mEq/L)   Chloride 96  96 - 112 (mEq/L)   CO2 23  19 - 32 (mEq/L)   Glucose, Bld 149 (*) 70 - 99 (mg/dL)   BUN 37 (*) 6 - 23 (mg/dL)   Creatinine, Ser 0.86 (*) 0.50 - 1.35 (mg/dL)   Calcium 9.3  8.4 - 57.8 (mg/dL)   Total Protein 8.0  6.0 - 8.3 (g/dL)   Albumin 3.5  3.5 - 5.2 (g/dL)   AST 14  0 - 37 (U/L)   ALT 14  0 - 53 (U/L)   Alkaline Phosphatase 71  39 - 117 (U/L)   Total Bilirubin 0.4  0.3 - 1.2 (mg/dL)   GFR calc non Af Amer 23 (*) >90 (mL/min)   GFR calc Af Amer 27 (*) >90 (mL/min)  TROPONIN I      Component Value Range   Troponin I <0.30  <0.30 (ng/mL)   Dg Chest Port 1 View  04/20/2011  *RADIOLOGY REPORT*  Clinical Data: Cough and chest pain.  Smoker.  PORTABLE CHEST - 1 VIEW  Comparison: 11/26/2005  Findings: Borderline heart size with normal pulmonary vascularity. Hazy interstitial opacities throughout both lungs which might represent interstitial edema or fibrosis.  Linear scarring and postoperative changes in the left upper lung.  No blunting of costophrenic angles.  No pneumothorax.  No focal airspace consolidation.  IMPRESSION: Diffuse interstitial pattern to the lungs which might represent mild edema or fibrosis.  Scarring and postoperative changes on the left.  No evidence of active consolidation.  Original Report Authenticated By: Marlon Pel, M.D.   No diagnosis found.    MDM  Patient with flu like symptoms since Thursday. Appetite poor.  Initially hypotensive.   Given IVF with good response. Has taken PO fluids and had a snack. Labs unremarkable. Chest xray with no acute findings. Given albuterol  nebulizer treatment with subjective improvement in breathing. O2 sats have remained 96% on room air. Pt feels improved after observation and/or treatment in ED.Pt stable in ED with no significant deterioration in condition.The patient appears reasonably screened and/or stabilized for discharge and I doubt any other medical condition or other [facility] requiring further screening, evaluation, or treatment in the ED at this time prior to discharge.  I personally performed the services described in this documentation, which was scribed in my presence. The recorded information has been reviewed and considered.   MDM Reviewed: nursing note and vitals Interpretation: labs, ECG and x-ray Total time providing critical care: 35.         Nicoletta Dress. Colon Branch, MD 04/21/11 1610

## 2011-04-20 NOTE — ED Notes (Addendum)
Patient states he has been eating well but has lost weight. States he has had a cough for a few days and generalized weakness.

## 2011-04-20 NOTE — ED Notes (Signed)
Pt drinking sprite  

## 2011-04-20 NOTE — ED Notes (Signed)
Pt receiving 3rd liter bolus and breathing treatment

## 2011-04-20 NOTE — ED Notes (Signed)
Patient is resting comfortably. 

## 2011-04-21 NOTE — ED Notes (Signed)
Patient given warm blanket.

## 2011-08-12 ENCOUNTER — Other Ambulatory Visit: Payer: Self-pay | Admitting: Family Medicine

## 2011-08-12 DIAGNOSIS — R918 Other nonspecific abnormal finding of lung field: Secondary | ICD-10-CM

## 2011-08-13 ENCOUNTER — Ambulatory Visit
Admission: RE | Admit: 2011-08-13 | Discharge: 2011-08-13 | Disposition: A | Discharge status: Home / Self Care | Payer: Medicaid Other | Source: Ambulatory Visit | Attending: Family Medicine | Admitting: Family Medicine

## 2011-08-13 DIAGNOSIS — R918 Other nonspecific abnormal finding of lung field: Secondary | ICD-10-CM

## 2011-08-29 ENCOUNTER — Encounter (INDEPENDENT_AMBULATORY_CARE_PROVIDER_SITE_OTHER): Payer: Self-pay | Admitting: *Deleted

## 2011-09-11 ENCOUNTER — Other Ambulatory Visit (INDEPENDENT_AMBULATORY_CARE_PROVIDER_SITE_OTHER): Payer: Self-pay | Admitting: *Deleted

## 2011-09-11 ENCOUNTER — Telehealth (INDEPENDENT_AMBULATORY_CARE_PROVIDER_SITE_OTHER): Payer: Self-pay | Admitting: *Deleted

## 2011-09-11 ENCOUNTER — Encounter (INDEPENDENT_AMBULATORY_CARE_PROVIDER_SITE_OTHER): Payer: Self-pay | Admitting: *Deleted

## 2011-09-11 DIAGNOSIS — Z1211 Encounter for screening for malignant neoplasm of colon: Secondary | ICD-10-CM

## 2011-09-11 MED ORDER — PEG-KCL-NACL-NASULF-NA ASC-C 100 G PO SOLR: 1.0000 | Freq: Once | ORAL | Status: DC

## 2011-09-11 NOTE — Telephone Encounter (Signed)
Patient needs movi prep 

## 2011-09-30 ENCOUNTER — Telehealth (INDEPENDENT_AMBULATORY_CARE_PROVIDER_SITE_OTHER): Payer: Self-pay | Admitting: *Deleted

## 2011-09-30 NOTE — Telephone Encounter (Signed)
PCP/Requesting MD: Rudi Heap  Name & DOB: Adam Warner 2055-10-20     Procedure: tcs  Reason/Indication:  screening  Has patient had this procedure before?  no  If so, when, by whom and where?    Is there a family history of colon cancer?  no  Who?  What age when diagnosed?    Is patient diabetic?   yes      Does patient have prosthetic heart valve?  no  Do you have a pacemaker?  no  Has patient had joint replacement within last 12 months?  no  Is patient on Coumadin, Plavix and/or Aspirin? yes  Medications: asa 81 mg daily, metformin 500 mg daily, diovan 320 mg daily, atorvastatin 40 mg daily  Allergies: nkda  Medication Adjustment: asa 2 days, hold metformin day before  Procedure date & time: 10/22/11 at 1200

## 2011-10-03 NOTE — Telephone Encounter (Signed)
agree

## 2011-10-13 ENCOUNTER — Encounter (HOSPITAL_COMMUNITY): Payer: Self-pay | Admitting: Pharmacy Technician

## 2011-10-22 ENCOUNTER — Encounter (HOSPITAL_COMMUNITY)
Admission: RE | Disposition: A | Discharge status: Home / Self Care | Payer: Self-pay | Source: Ambulatory Visit | Attending: Internal Medicine

## 2011-10-22 ENCOUNTER — Ambulatory Visit (HOSPITAL_COMMUNITY)
Admission: RE | Admit: 2011-10-22 | Discharge: 2011-10-22 | Disposition: A | Discharge status: Home / Self Care | Payer: Medicaid Other | Source: Ambulatory Visit | Attending: Internal Medicine | Admitting: Internal Medicine

## 2011-10-22 ENCOUNTER — Encounter (HOSPITAL_COMMUNITY): Payer: Self-pay | Admitting: *Deleted

## 2011-10-22 DIAGNOSIS — I10 Essential (primary) hypertension: Secondary | ICD-10-CM | POA: Insufficient documentation

## 2011-10-22 DIAGNOSIS — K644 Residual hemorrhoidal skin tags: Secondary | ICD-10-CM | POA: Insufficient documentation

## 2011-10-22 DIAGNOSIS — J4489 Other specified chronic obstructive pulmonary disease: Secondary | ICD-10-CM | POA: Insufficient documentation

## 2011-10-22 DIAGNOSIS — D126 Benign neoplasm of colon, unspecified: Secondary | ICD-10-CM | POA: Insufficient documentation

## 2011-10-22 DIAGNOSIS — Z1211 Encounter for screening for malignant neoplasm of colon: Secondary | ICD-10-CM

## 2011-10-22 DIAGNOSIS — J449 Chronic obstructive pulmonary disease, unspecified: Secondary | ICD-10-CM | POA: Insufficient documentation

## 2011-10-22 DIAGNOSIS — E119 Type 2 diabetes mellitus without complications: Secondary | ICD-10-CM | POA: Insufficient documentation

## 2011-10-22 DIAGNOSIS — Z79899 Other long term (current) drug therapy: Secondary | ICD-10-CM | POA: Insufficient documentation

## 2011-10-22 HISTORY — PX: COLONOSCOPY: SHX5424

## 2011-10-22 SURGERY — COLONOSCOPY
Anesthesia: Moderate Sedation

## 2011-10-22 MED ORDER — MEPERIDINE HCL 50 MG/ML IJ SOLN
INTRAMUSCULAR | Status: DC | PRN
  Administered 2011-10-22 (×2): 25 mg via INTRAVENOUS

## 2011-10-22 MED ORDER — MIDAZOLAM HCL 5 MG/5ML IJ SOLN
INTRAMUSCULAR | Status: DC | PRN
  Administered 2011-10-22 (×2): 2 mg via INTRAVENOUS

## 2011-10-22 MED ORDER — SODIUM CHLORIDE 0.45 % IV SOLN
Freq: Once | INTRAVENOUS | Status: AC
  Administered 2011-10-22: 11:00:00 via INTRAVENOUS

## 2011-10-22 MED ORDER — MEPERIDINE HCL 50 MG/ML IJ SOLN
INTRAMUSCULAR | Status: AC
  Filled 2011-10-22: qty 1

## 2011-10-22 MED ORDER — MIDAZOLAM HCL 5 MG/5ML IJ SOLN
INTRAMUSCULAR | Status: AC
  Filled 2011-10-22: qty 10

## 2011-10-22 MED ORDER — STERILE WATER FOR IRRIGATION IR SOLN
Status: DC | PRN
  Administered 2011-10-22: 12:00:00

## 2011-10-22 NOTE — Op Note (Signed)
COLONOSCOPY PROCEDURE REPORT  PATIENT:  Adam Warner  MR#:  [id number] Birthdate:  October 27, 1955, 56 y.o., male Endoscopist:  Dr. Malissa Hippo, MD Referred By:  Dr. Rudi Heap, MD Procedure Date: 10/22/2011  Procedure:   Colonoscopy  Indications:  Patient is 56 year old Caucasian male who is undergoing average risk screening colonoscopy.  Informed Consent:  The procedure and risks were reviewed with the patient and informed consent was obtained.  Medications:  Demerol 50 mg IV Versed 4  mg IV  Description of procedure:  After a digital rectal exam was performed, that colonoscope was advanced from the anus through the rectum and colon to the area of the cecum, ileocecal valve and appendiceal orifice. The cecum was deeply intubated. These structures were well-seen and photographed for the record. From the level of the cecum and ileocecal valve, the scope was slowly and cautiously withdrawn. The mucosal surfaces were carefully surveyed utilizing scope tip to flexion to facilitate fold flattening as needed. The scope was pulled down into the rectum where a thorough exam including retroflexion was performed.  Findings:   Prep satisfactory. Two small polyps ablated via cold biopsy from the transverse colon and submitted together. Normal rectal mucosa. Mall hemorrhoids below the dentate line.  Therapeutic/Diagnostic Maneuvers Performed:  See above  Complications:  None  Cecal Withdrawal Time:  17 minutes  Impression:  Examination performed to cecum. two small polyps ablated via cold biopsy from transverse colon and submitted together. Small external hemorrhoids.  Recommendations:  Standard instructions given. I will contact patient with biopsy results.  Adam Warner  10/22/2011 12:17 PM  CC: Dr. Rudi Heap, MD & Dr. Bonnetta Barry ref. provider found

## 2011-10-22 NOTE — H&P (Signed)
Adam Warner is an 56 y.o. male.   Chief Complaint: Patient is here for colonoscopy. HPI: Patient is 56 year old Caucasian male who is here for  screening colonoscopy. This is patient's first exam. He denies abdominal pain change in her bowel habits or rectal bleeding. Him history is negative for colorectal carcinoma to   Past Medical History  Diagnosis Date  . COPD (chronic obstructive pulmonary disease)   . Stroke   . Hypertension   . Diabetes mellitus   . Coronary artery disease     Past Surgical History  Procedure Date  . Lung surgery   . Angioplasty     History reviewed. No pertinent family history. Social History:  reports that he has been smoking Cigarettes.  He has been smoking about 2 packs per day. He does not have any smokeless tobacco history on file. He reports that he drinks alcohol. He reports that he does not use illicit drugs.  Allergies:  Allergies  Allergen Reactions  . Zyban (Bupropion Hcl) Other (See Comments)    Seeing black spots    Medications Prior to Admission  Medication Sig Dispense Refill  . aspirin EC 325 MG tablet Take 325 mg by mouth every morning.       Marland Kitchen atorvastatin (LIPITOR) 40 MG tablet Take 40 mg by mouth every morning.       . metFORMIN (GLUCOPHAGE) 500 MG tablet Take 500 mg by mouth daily with breakfast.       . peg 3350 powder (MOVIPREP) 100 G SOLR Take 1 kit (100 g total) by mouth once.  1 kit  0  . valsartan (DIOVAN) 320 MG tablet Take 320 mg by mouth every morning.         Results for orders placed during the hospital encounter of 10/22/11 (from the past 48 hour(s))  GLUCOSE, CAPILLARY     Status: Abnormal   Collection Time   10/22/11 10:54 AM      Component Value Range Comment   Glucose-Capillary 109 (*) 70 - 99 mg/dL    No results found.  ROS  Blood pressure 152/87, pulse 66, temperature 98.1 F (36.7 C), temperature source Oral, resp. rate 20, height 5\' 5"  (1.651 m), weight 124 lb (56.246 kg), SpO2 99.00%. Physical Exam   Constitutional: He appears well-developed and well-nourished.  HENT:  Mouth/Throat: Oropharynx is clear and moist.  Eyes: Conjunctivae are normal. No scleral icterus.  Neck: No thyromegaly present.  Cardiovascular: Normal rate, regular rhythm and normal heart sounds.   No murmur heard. Respiratory: Effort normal and breath sounds normal.  GI: Soft. He exhibits no distension and no mass. There is no tenderness.  Lymphadenopathy:    He has no cervical adenopathy.  Neurological: He is alert.  Skin: Skin is warm and dry.     Assessment/Plan Average risk screening colonoscopy.  Aryka Coonradt U 10/22/2011, 11:44 AM

## 2011-10-27 ENCOUNTER — Encounter (HOSPITAL_COMMUNITY): Payer: Self-pay | Admitting: Internal Medicine

## 2011-10-30 ENCOUNTER — Encounter (INDEPENDENT_AMBULATORY_CARE_PROVIDER_SITE_OTHER): Payer: Self-pay | Admitting: *Deleted

## 2012-01-07 ENCOUNTER — Encounter (HOSPITAL_COMMUNITY): Payer: Self-pay | Admitting: Emergency Medicine

## 2012-01-07 ENCOUNTER — Emergency Department (HOSPITAL_COMMUNITY)
Admission: EM | Admit: 2012-01-07 | Discharge: 2012-01-07 | Disposition: A | Discharge status: Home / Self Care | Payer: Medicaid Other | Attending: Emergency Medicine | Admitting: Emergency Medicine

## 2012-01-07 ENCOUNTER — Emergency Department (HOSPITAL_COMMUNITY): Payer: Medicaid Other

## 2012-01-07 DIAGNOSIS — J449 Chronic obstructive pulmonary disease, unspecified: Secondary | ICD-10-CM

## 2012-01-07 DIAGNOSIS — E119 Type 2 diabetes mellitus without complications: Secondary | ICD-10-CM | POA: Insufficient documentation

## 2012-01-07 DIAGNOSIS — I10 Essential (primary) hypertension: Secondary | ICD-10-CM | POA: Insufficient documentation

## 2012-01-07 DIAGNOSIS — Z8673 Personal history of transient ischemic attack (TIA), and cerebral infarction without residual deficits: Secondary | ICD-10-CM | POA: Insufficient documentation

## 2012-01-07 DIAGNOSIS — J4489 Other specified chronic obstructive pulmonary disease: Secondary | ICD-10-CM | POA: Insufficient documentation

## 2012-01-07 DIAGNOSIS — Z7982 Long term (current) use of aspirin: Secondary | ICD-10-CM | POA: Insufficient documentation

## 2012-01-07 DIAGNOSIS — Z79899 Other long term (current) drug therapy: Secondary | ICD-10-CM | POA: Insufficient documentation

## 2012-01-07 MED ORDER — ALBUTEROL SULFATE HFA 108 (90 BASE) MCG/ACT IN AERS
2.0000 | INHALATION_SPRAY | RESPIRATORY_TRACT | Status: DC | PRN
  Administered 2012-01-07: 2 via RESPIRATORY_TRACT
  Filled 2012-01-07: qty 6.7

## 2012-01-07 NOTE — ED Notes (Signed)
Pt c/o cough, congestion, and chest tightness x7 days. Pt states cough is productive with yellow sputum. Pt also c/o SOB with the coughing. Pt has hx of COPD and pneumonia.

## 2012-01-07 NOTE — ED Provider Notes (Signed)
History     CSN: [id number]  Arrival date & time 01/07/12  2045   First MD Initiated Contact with Patient 01/07/12 2117      Chief Complaint  Patient presents with  . Cough  . Chest Pain    (Consider location/radiation/quality/duration/timing/severity/associated sxs/prior treatment) HPI Pt with history of COPD s/p lobectomy (benign) reports about a week of productive cough, yellow sputum with subjective fevers. States he has been using his Spiriva inhaler "as needed" at home. Continues to smoke 2 ppd. Had a brief episode of pleuritic chest pains while coughing earlier but otherwise no CP and no significant SOB.  Past Medical History  Diagnosis Date  . COPD (chronic obstructive pulmonary disease)   . Stroke   . Hypertension   . Diabetes mellitus   . Coronary artery disease     Past Surgical History  Procedure Date  . Lung surgery   . Angioplasty   . Colonoscopy 10/22/2011    Procedure: COLONOSCOPY;  Surgeon: Malissa Hippo, MD;  Location: [facility];  Service: Endoscopy;  Laterality: N/A;  1200    No family history on file.  History  Substance Use Topics  . Smoking status: Current Every Day Smoker -- 2.0 packs/day    Types: Cigarettes  . Smokeless tobacco: Not on file  . Alcohol Use: Yes     occ      Review of Systems All other systems reviewed and are negative except as noted in HPI.   Allergies  Zyban  Home Medications   Current Outpatient Rx  Name Route Sig Dispense Refill  . ASPIRIN EC 325 MG PO TBEC Oral Take 325 mg by mouth every morning.     . ATORVASTATIN CALCIUM 40 MG PO TABS Oral Take 40 mg by mouth every morning.     Marland Kitchen METFORMIN HCL 500 MG PO TABS Oral Take 500 mg by mouth daily with breakfast.     . VALSARTAN 320 MG PO TABS Oral Take 320 mg by mouth every morning.       BP 133/84  Pulse 93  Temp 97.5 F (36.4 C) (Oral)  Resp 20  Ht 5\' 5"  (1.651 m)  Wt 135 lb (61.236 kg)  BMI 22.47 kg/m2  SpO2 98%  Physical Exam  Nursing note and  vitals reviewed. Constitutional: He is oriented to person, place, and time. He appears well-developed and well-nourished.  HENT:  Head: Normocephalic and atraumatic.  Eyes: EOM are normal. Pupils are equal, round, and reactive to light.  Neck: Normal range of motion. Neck supple.  Cardiovascular: Normal rate, normal heart sounds and intact distal pulses.   Pulmonary/Chest: Effort normal and breath sounds normal.  Abdominal: Bowel sounds are normal. He exhibits no distension. There is no tenderness.  Musculoskeletal: Normal range of motion. He exhibits no edema and no tenderness.  Neurological: He is alert and oriented to person, place, and time. He has normal strength. No cranial nerve deficit or sensory deficit.  Skin: Skin is warm and dry. No rash noted.  Psychiatric: He has a normal mood and affect.    ED Course  Procedures (including critical care time)  Labs Reviewed - No data to display Dg Chest 2 View  01/07/2012  *RADIOLOGY REPORT*  Clinical Data: COPD.  Cough.  CHEST - 2 VIEW  Comparison: CT chest 08/13/2011 and plain film chest 04/20/2011. PA and lateral chest 11/26/2005.  Findings: Postoperative change is again seen in the left chest. Lungs appear clear.  Heart size is normal.  No pneumothorax or pleural fluid.  IMPRESSION: No acute finding.  Stable compared to prior exam.   Original Report Authenticated By: Bernadene Bell. Maricela Curet, M.D.      No diagnosis found.    MDM   Date: 01/07/2012  Rate: 80  Rhythm: normal sinus rhythm  QRS Axis: right  Intervals: normal  ST/T Wave abnormalities: nonspecific T wave changes  Conduction Disutrbances:right bundle branch block  Narrative Interpretation:   Old EKG Reviewed: unchanged  EKG unchanged. Lungs clear, afebrile and normal SpO2 on RA. Will check CXR. Plan for albuterol inhaler and PCP follow-up if neg. Pt counseled that Spiriva is meant to be used daily with albuterol as needed.           Charles B. Bernette Mayers,  MD 01/07/12 2142

## 2012-01-07 NOTE — ED Notes (Signed)
Patient c/o cough x 7 days.  States productive cough with yellow phlegm.

## 2012-02-05 ENCOUNTER — Encounter: Payer: Self-pay | Admitting: Cardiovascular Disease

## 2012-02-12 ENCOUNTER — Other Ambulatory Visit: Payer: Self-pay | Admitting: *Deleted

## 2012-02-12 DIAGNOSIS — I6529 Occlusion and stenosis of unspecified carotid artery: Secondary | ICD-10-CM

## 2012-05-31 ENCOUNTER — Encounter (INDEPENDENT_AMBULATORY_CARE_PROVIDER_SITE_OTHER): Payer: Medicaid Other

## 2012-05-31 DIAGNOSIS — I6529 Occlusion and stenosis of unspecified carotid artery: Secondary | ICD-10-CM

## 2012-05-31 DIAGNOSIS — R0989 Other specified symptoms and signs involving the circulatory and respiratory systems: Secondary | ICD-10-CM

## 2012-07-22 ENCOUNTER — Other Ambulatory Visit: Payer: Self-pay | Admitting: *Deleted

## 2012-07-22 DIAGNOSIS — R918 Other nonspecific abnormal finding of lung field: Secondary | ICD-10-CM

## 2012-09-14 ENCOUNTER — Encounter: Payer: Self-pay | Admitting: *Deleted

## 2012-10-04 ENCOUNTER — Ambulatory Visit: Payer: Self-pay | Admitting: Family Medicine

## 2012-10-11 ENCOUNTER — Telehealth: Payer: Self-pay | Admitting: Family Medicine

## 2012-10-12 ENCOUNTER — Ambulatory Visit: Payer: Self-pay | Admitting: Family Medicine

## 2012-10-13 NOTE — Telephone Encounter (Signed)
appt rescheduled.

## 2012-11-04 ENCOUNTER — Encounter: Payer: Self-pay | Admitting: Family Medicine

## 2012-11-04 ENCOUNTER — Ambulatory Visit (INDEPENDENT_AMBULATORY_CARE_PROVIDER_SITE_OTHER): Payer: Medicaid Other | Admitting: Family Medicine

## 2012-11-04 VITALS — BP 120/79 | HR 86 | Temp 97.3°F | Ht 66.25 in | Wt 112.2 lb

## 2012-11-04 DIAGNOSIS — F17200 Nicotine dependence, unspecified, uncomplicated: Secondary | ICD-10-CM

## 2012-11-04 DIAGNOSIS — E119 Type 2 diabetes mellitus without complications: Secondary | ICD-10-CM

## 2012-11-04 DIAGNOSIS — I10 Essential (primary) hypertension: Secondary | ICD-10-CM

## 2012-11-04 DIAGNOSIS — E785 Hyperlipidemia, unspecified: Secondary | ICD-10-CM

## 2012-11-04 DIAGNOSIS — J449 Chronic obstructive pulmonary disease, unspecified: Secondary | ICD-10-CM

## 2012-11-04 DIAGNOSIS — W57XXXA Bitten or stung by nonvenomous insect and other nonvenomous arthropods, initial encounter: Secondary | ICD-10-CM

## 2012-11-04 DIAGNOSIS — J4489 Other specified chronic obstructive pulmonary disease: Secondary | ICD-10-CM

## 2012-11-04 DIAGNOSIS — I6529 Occlusion and stenosis of unspecified carotid artery: Secondary | ICD-10-CM

## 2012-11-04 DIAGNOSIS — Z720 Tobacco use: Secondary | ICD-10-CM

## 2012-11-04 DIAGNOSIS — E559 Vitamin D deficiency, unspecified: Secondary | ICD-10-CM

## 2012-11-04 DIAGNOSIS — I2510 Atherosclerotic heart disease of native coronary artery without angina pectoris: Secondary | ICD-10-CM

## 2012-11-04 LAB — BASIC METABOLIC PANEL WITH GFR
Calcium: 9.8 mg/dL (ref 8.4–10.5)
Creat: 1.05 mg/dL (ref 0.50–1.35)
GFR, Est African American: >89 mL/min
GFR, Est Non African American: 78 mL/min
Sodium: 138 mEq/L (ref 135–145)

## 2012-11-04 LAB — POCT CBC
Granulocyte percent: 65.8 %G (ref 37–80)
Hemoglobin: 13.3 g/dL — AB (ref 14.1–18.1)
Lymph, poc: 2 (ref 0.6–3.4)
MCH, POC: 33.3 pg — AB (ref 27–31.2)
MCV: 94 fL (ref 80–97)
MPV: 7.6 fL (ref 0–99.8)
POC LYMPH PERCENT: 28.7 %L (ref 10–50)
Platelet Count, POC: 249 10*3/uL (ref 142–424)
RBC: 4 M/uL — AB (ref 4.69–6.13)

## 2012-11-04 LAB — POCT GLYCOSYLATED HEMOGLOBIN (HGB A1C): Hemoglobin A1C: 5.7

## 2012-11-04 MED ORDER — DOXYCYCLINE HYCLATE 100 MG PO TABS: 100.0000 mg | ORAL_TABLET | Freq: Two times a day (BID) | ORAL | Status: DC

## 2012-11-04 MED ORDER — GLUCOSE BLOOD VI STRP: ORAL_STRIP | Status: DC

## 2012-11-04 NOTE — Addendum Note (Signed)
Addended by: Orma Render F on: 11/04/2012 04:10 PM   Modules accepted: Orders

## 2012-11-04 NOTE — Patient Instructions (Addendum)
Fall precautions discussed Continue current meds and therapeutic lifestyle changes Pt will schedule eye exam Check self for ticks daily  Wood Tick Bite Ticks are insects that attach themselves to the skin. Most tick bites are harmless, but sometimes ticks carry diseases that can make a person quite ill. The chance of getting ill depends on:  The kind of tick that bites you.  Time of year.  How long the tick is attached.  Geographic location. Wood ticks are also called dog ticks. They are generally black. They can have white markings. They live in shrubs and grassy areas. They are larger than deer ticks. Wood ticks are about the size of a watermelon seed. They have a hard body. The most common places for ticks to attach themselves are the scalp, neck, armpits, waist, and groin. Wood ticks may stay attached for up to 2 weeks. TICKS MUST BE REMOVED AS SOON AS POSSIBLE TO HELP PREVENT DISEASES CAUSED BY TICK BITES.  TO REMOVE A TICK: 1. If available, put on latex gloves before trying to remove a tick. 2. Grasp the tick as close to the skin as possible, with curved forceps, fine tweezers or a special tick removal tool. 3. Pull gently with steady pressure until the tick lets go. Do not twist the tick or jerk it suddenly. This may break off the tick's head or mouth parts. 4. Do not crush the tick's body. This could force disease-carrying fluids from the tick into your body. 5. After the tick is removed, wash the bite area and your hands with soap and water or other disinfectant. 6. Apply a small amount of antiseptic cream or ointment to the bite site. 7. Wash and disinfect any instruments that were used. 8. Save the tick in a jar or plastic bag for later identification. Preserve the tick with a bit of alcohol or put it in the freezer. 9. Do not apply a hot match, petroleum jelly, or fingernail polish to the tick. This does not work and may increase the chances of disease from the tick bite. YOU  MAY NEED TO SEE YOUR CAREGIVER FOR A TETANUS SHOT NOW IF:  You have no idea when you had the last one.  You have never had a tetanus shot before. If you need a tetanus shot, and you decide not to get one, there is a rare chance of getting tetanus. Sickness from tetanus can be serious. If you get a tetanus shot, your arm may swell, get red and warm to the touch at the shot site. This is common and not a problem. PREVENTION  Wear protective clothing. Long sleeves and pants are best.  Wear white clothes to see ticks more easily  Tuck your pant legs into your socks.  If walking on trail, stay in the middle of the trail to avoid brushing against bushes.  Put insect repellent on all exposed skin and along boot tops, pant legs and sleeve cuffs  Check clothing, hair and skin repeatedly and before coming inside.  Brush off any ticks that are not attached. SEEK MEDICAL CARE IF:   You cannot remove a tick or part of the tick that is left in the skin.  Unexplained fever.  Redness and swelling in the area of the tick bite.  Tender, swollen lymph glands.  Diarrhea.  Weight loss.  Cough.  Fatigue.  Muscle, joint or bone pain.  Belly pain.  Headache.  Rash. SEEK IMMEDIATE MEDICAL CARE IF:   You develop an oral temperature  above 102 F (38.9 C).  You are having trouble walking or moving your legs.  Numbness in the legs.  Shortness of breath.  Confusion.  Repeated vomiting. Document Released: 04/04/2000 Document Revised: 06/30/2011 Document Reviewed: 03/13/2008 [facility] Patient Information 2014 [place], [place].

## 2012-11-04 NOTE — Progress Notes (Signed)
Subjective:    Patient ID: Adam Warner, male    DOB: 1955-12-31, 57 y.o.   MRN: [id number]  HPI Adam Warner comes in today for followup of chronic medical problems, these include hypertension hyperlipidemia diabetes coronary artery disease and COPD. He is also still smoking. His colonoscopy is up to date. His last cardiac evaluation was in December 2 011, with a request to return as needed. Blood sugars at home 1-2 hours after eating 130-140. According to the patient they run low in the morning.   Review of Systems  Constitutional: Positive for fatigue (some).  HENT: Positive for congestion and postnasal drip. Negative for ear pain.   Eyes: Negative.   Respiratory: Positive for shortness of breath (with exertion).   Cardiovascular: Negative.   Gastrointestinal: Negative.   Endocrine: Negative.   Genitourinary: Positive for frequency. Negative for dysuria.  Musculoskeletal: Positive for arthralgias (R shoulder). Negative for back pain.  Skin: Negative.   Allergic/Immunologic: Positive for environmental allergies (seasonal).  Neurological: Positive for dizziness (occasional). Negative for headaches.  Psychiatric/Behavioral: Negative.        Objective:   Physical Exam BP 120/79  Pulse 86  Temp(Src) 97.3 F (36.3 C) (Oral)  Ht 5' 6.25" (1.683 m)  Wt 112 lb 3.2 oz (50.894 kg)  BMI 17.97 kg/m2  The patient appeared somewhat undernourished and thin, but normal for him. He was alert and oriented to time and place. Speech, behavior and judgement appear normal. Vital signs as documented.  Head exam is unremarkable. No scleral icterus or pallor noted. He is edentulous. Ears nose and throat were normal.  Neck is without jugular venous distension or thyromegally.There were bilateral carotid bruits. Carotid upstrokes are brisk bilaterally. No cervical adenopathy. Lungs are clear anteriorly and posteriorly to auscultation. Slightly decreased breaths sounds bilaterally. Normal respiratory  effort. Cardiac exam reveals regular rate and rhythm at 72 per minute. First and second heart sounds normal.  No murmurs, rubs or gallops.  Abdominal exam reveals normal bowl sounds, no masses, no organomegaly and no aortic enlargement. No inguinal adenopathy. Abdomen was thin and scaphoid without masses or tenderness. Extremities are nonedematous and both femoral and pedal pulses are normal. A wick was removed from the bottom of the left foot at the base of the second Toe. Skin without pallor or jaundice.  Warm and dry, without rash. Neurologic exam reveals normal deep tendon reflexes and normal sensation. A. diabetic foot exam was done.          Assessment & Plan:  1. Type 2 diabetes mellitus - POCT UA - Microalbumin; Standing - BASIC METABOLIC PANEL WITH GFR; Standing - POCT glycosylated hemoglobin (Hb A1C); Standing - POCT UA - Microalbumin - BASIC METABOLIC PANEL WITH GFR - POCT glycosylated hemoglobin (Hb A1C) - glucose blood (ACCU-CHEK AVIVA) test strip; Use as instructed  Dispense: 100 each; Refill: 12  2. Hypertension - POCT CBC; Standing - BASIC METABOLIC PANEL WITH GFR; Standing - POCT CBC - BASIC METABOLIC PANEL WITH GFR  3. CAD (coronary artery disease)  4. Hyperlipemia - NMR Lipoprofile with Lipids; Standing - NMR Lipoprofile with Lipids  5. COPD (chronic obstructive pulmonary disease 6. Tobacco abuse  7. Vitamin D deficiency - Vitamin D 25 hydroxy; Standing - Vitamin D 25 hydroxy  8. HYPERTENSION  9. CAD -He only sees the cardiologist when necessary  10. Occlusion and stenosis of carotid artery without mention of cerebral infarction, bilateral  11. COPD UNSPECIFIED -Check for alpha deficiency  12. DIABETES MELLITUS  13. Tick  bite - doxycycline (VIBRA-TABS) 100 MG tablet; Take 1 tablet (100 mg total) by mouth 2 (two) times daily.  Dispense: 20 tablet; Refill: 0  Patient Instructions  Fall precautions discussed Continue current meds and  therapeutic lifestyle changes Pt will schedule eye exam Check self for ticks daily  Wood Tick Bite Ticks are insects that attach themselves to the skin. Most tick bites are harmless, but sometimes ticks carry diseases that can make a person quite ill. The chance of getting ill depends on:  The kind of tick that bites you.  Time of year.  How long the tick is attached.  Geographic location. Wood ticks are also called dog ticks. They are generally black. They can have white markings. They live in shrubs and grassy areas. They are larger than deer ticks. Wood ticks are about the size of a watermelon seed. They have a hard body. The most common places for ticks to attach themselves are the scalp, neck, armpits, waist, and groin. Wood ticks may stay attached for up to 2 weeks. TICKS MUST BE REMOVED AS SOON AS POSSIBLE TO HELP PREVENT DISEASES CAUSED BY TICK BITES.  TO REMOVE A TICK: 1. If available, put on latex gloves before trying to remove a tick. 2. Grasp the tick as close to the skin as possible, with curved forceps, fine tweezers or a special tick removal tool. 3. Pull gently with steady pressure until the tick lets go. Do not twist the tick or jerk it suddenly. This may break off the tick's head or mouth parts. 4. Do not crush the tick's body. This could force disease-carrying fluids from the tick into your body. 5. After the tick is removed, wash the bite area and your hands with soap and water or other disinfectant. 6. Apply a small amount of antiseptic cream or ointment to the bite site. 7. Wash and disinfect any instruments that were used. 8. Save the tick in a jar or plastic bag for later identification. Preserve the tick with a bit of alcohol or put it in the freezer. 9. Do not apply a hot match, petroleum jelly, or fingernail polish to the tick. This does not work and may increase the chances of disease from the tick bite. YOU MAY NEED TO SEE YOUR CAREGIVER FOR A TETANUS SHOT NOW  IF:  You have no idea when you had the last one.  You have never had a tetanus shot before. If you need a tetanus shot, and you decide not to get one, there is a rare chance of getting tetanus. Sickness from tetanus can be serious. If you get a tetanus shot, your arm may swell, get red and warm to the touch at the shot site. This is common and not a problem. PREVENTION  Wear protective clothing. Long sleeves and pants are best.  Wear white clothes to see ticks more easily  Tuck your pant legs into your socks.  If walking on trail, stay in the middle of the trail to avoid brushing against bushes.  Put insect repellent on all exposed skin and along boot tops, pant legs and sleeve cuffs  Check clothing, hair and skin repeatedly and before coming inside.  Brush off any ticks that are not attached. SEEK MEDICAL CARE IF:   You cannot remove a tick or part of the tick that is left in the skin.  Unexplained fever.  Redness and swelling in the area of the tick bite.  Tender, swollen lymph glands.  Diarrhea.  Weight  loss.  Cough.  Fatigue.  Muscle, joint or bone pain.  Belly pain.  Headache.  Rash. SEEK IMMEDIATE MEDICAL CARE IF:   You develop an oral temperature above 102 F (38.9 C).  You are having trouble walking or moving your legs.  Numbness in the legs.  Shortness of breath.  Confusion.  Repeated vomiting. Document Released: 04/04/2000 Document Revised: 06/30/2011 Document Reviewed: 03/13/2008 [facility] Patient Information 2014 [place], [place].    Nyra Capes MD

## 2012-11-05 LAB — NMR LIPOPROFILE WITH LIPIDS
HDL Size: 10.4 nm (ref >=9.2)
HDL-C: 80 mg/dL (ref >=40)
LDL (calc): 94 mg/dL (ref <100)
LDL Size: 21.1 nm (ref >20.5)
LP-IR Score: <25 (ref <=45)
Triglycerides: 57 mg/dL (ref <150)

## 2012-11-05 LAB — VITAMIN D 25 HYDROXY (VIT D DEFICIENCY, FRACTURES): Vit D, 25-Hydroxy: 55 ng/mL (ref 30–89)

## 2012-11-09 LAB — HEPATIC FUNCTION PANEL
Albumin: 4.2 g/dL (ref 3.5–5.2)
Total Bilirubin: 0.7 mg/dL (ref 0.3–1.2)
Total Protein: 7.2 g/dL (ref 6.0–8.3)

## 2012-11-09 NOTE — Addendum Note (Signed)
Addended by: Orma Render F on: 11/09/2012 11:24 AM   Modules accepted: Orders

## 2012-11-19 ENCOUNTER — Telehealth: Payer: Self-pay | Admitting: Family Medicine

## 2012-11-19 NOTE — Telephone Encounter (Signed)
Patient aware.

## 2012-11-24 ENCOUNTER — Other Ambulatory Visit (INDEPENDENT_AMBULATORY_CARE_PROVIDER_SITE_OTHER): Payer: Medicaid Other

## 2012-11-24 DIAGNOSIS — Z1212 Encounter for screening for malignant neoplasm of rectum: Secondary | ICD-10-CM

## 2012-11-25 LAB — FECAL OCCULT BLOOD, IMMUNOCHEMICAL: Fecal Occult Blood: NEGATIVE

## 2013-02-15 ENCOUNTER — Other Ambulatory Visit: Payer: Self-pay | Admitting: *Deleted

## 2013-02-15 MED ORDER — METFORMIN HCL 500 MG PO TABS: 500.0000 mg | ORAL_TABLET | Freq: Every day | ORAL | Status: DC

## 2013-02-19 ENCOUNTER — Other Ambulatory Visit: Payer: Self-pay | Admitting: Family Medicine

## 2013-03-02 ENCOUNTER — Other Ambulatory Visit: Payer: Self-pay | Admitting: Family Medicine

## 2013-03-14 ENCOUNTER — Other Ambulatory Visit: Payer: Self-pay | Admitting: Family Medicine

## 2013-04-18 ENCOUNTER — Encounter: Payer: Self-pay | Admitting: Family Medicine

## 2013-04-18 ENCOUNTER — Ambulatory Visit (INDEPENDENT_AMBULATORY_CARE_PROVIDER_SITE_OTHER): Payer: Medicaid Other | Admitting: Family Medicine

## 2013-04-18 VITALS — BP 130/80 | HR 71 | Temp 97.1°F | Ht 66.25 in | Wt 115.0 lb

## 2013-04-18 DIAGNOSIS — R0989 Other specified symptoms and signs involving the circulatory and respiratory systems: Secondary | ICD-10-CM

## 2013-04-18 DIAGNOSIS — J4489 Other specified chronic obstructive pulmonary disease: Secondary | ICD-10-CM

## 2013-04-18 DIAGNOSIS — E119 Type 2 diabetes mellitus without complications: Secondary | ICD-10-CM

## 2013-04-18 DIAGNOSIS — I10 Essential (primary) hypertension: Secondary | ICD-10-CM

## 2013-04-18 DIAGNOSIS — R911 Solitary pulmonary nodule: Secondary | ICD-10-CM

## 2013-04-18 DIAGNOSIS — Z23 Encounter for immunization: Secondary | ICD-10-CM

## 2013-04-18 DIAGNOSIS — I2510 Atherosclerotic heart disease of native coronary artery without angina pectoris: Secondary | ICD-10-CM

## 2013-04-18 DIAGNOSIS — I6789 Other cerebrovascular disease: Secondary | ICD-10-CM

## 2013-04-18 DIAGNOSIS — E559 Vitamin D deficiency, unspecified: Secondary | ICD-10-CM

## 2013-04-18 DIAGNOSIS — J449 Chronic obstructive pulmonary disease, unspecified: Secondary | ICD-10-CM

## 2013-04-18 DIAGNOSIS — E785 Hyperlipidemia, unspecified: Secondary | ICD-10-CM

## 2013-04-18 NOTE — Addendum Note (Signed)
Addended by: Magdalene River on: 04/18/2013 11:53 AM   Modules accepted: Orders

## 2013-04-18 NOTE — Patient Instructions (Addendum)
Continue current medications. Continue good therapeutic lifestyle changes which include good diet and exercise. Fall precautions discussed with patient. Schedule your flu vaccine if you haven't had it yet If you are over 59 years old - you may need Prevnar 13 or the adult Pneumonia vaccine. SCHEDULE EYE EXAM AS SOON AS YOU CAN!! Call and check with her insurance regarding the Prevnar vaccine We will call you with the results of the lab work as soon as those results are returned

## 2013-04-18 NOTE — Addendum Note (Signed)
Addended by: Prescott Gum on: 04/18/2013 01:57 PM   Modules accepted: Orders

## 2013-04-18 NOTE — Progress Notes (Signed)
Subjective:    Patient ID: Adam Warner, male    DOB: July 20, 1955, 57 y.o.   MRN: [id number]  HPI Pt here for follow up and management of chronic medical problems. Patient denies any problems or symptoms with his chest or any TIA of the at. He is due today to get his lab work get a flu shot and he is also due to get an eye exam. He continues to smoke.       Patient Active Problem List   Diagnosis Date Noted  . CAD, NATIVE VESSEL 03/08/2010  . CAROTID ARTERY STENOSIS, WITHOUT INFARCTION 03/08/2010  . LEG PAIN 03/08/2010  . SYNCOPE AND COLLAPSE 03/08/2010  . ABNORMAL CV (STRESS) TEST 03/08/2010  . HYPERLIPIDEMIA 02/12/2010  . SMOKER 02/12/2010  . C V A / STROKE 02/12/2010  . COPD UNSPECIFIED 02/12/2010  . Nonspecific (abnormal) findings on radiological and other examination of body structure 02/12/2010  . ABNORMAL LUNG XRAY 02/12/2010  . DIABETES MELLITUS 02/11/2010  . HYPERTENSION 02/11/2010  . CAD 02/11/2010  . TIA 02/11/2010   Outpatient Encounter Prescriptions as of 04/18/2013  Medication Sig  . ACCU-CHEK AVIVA PLUS test strip USE TO CHECK ONCE OR TWICE DAILY  . aspirin EC 325 MG tablet Take 325 mg by mouth every morning.   Marland Kitchen atorvastatin (LIPITOR) 40 MG tablet TAKE ONE TABLET BY MOUTH ONE TIME DAILY  . Cholecalciferol (VITAMIN D) 2000 UNITS CAPS Take 1 capsule by mouth daily.  Marland Kitchen DIOVAN 320 MG tablet TAKE ONE TABLET BY MOUTH ONE TIME DAILY  . metFORMIN (GLUCOPHAGE) 500 MG tablet Take 1 tablet (500 mg total) by mouth at bedtime.  Marland Kitchen tiotropium (SPIRIVA) 18 MCG inhalation capsule Place 18 mcg into inhaler and inhale daily.  . [DISCONTINUED] doxycycline (VIBRA-TABS) 100 MG tablet Take 1 tablet (100 mg total) by mouth 2 (two) times daily.    Review of Systems  Constitutional: Negative.   HENT: Negative.   Eyes: Negative.   Respiratory: Negative.   Cardiovascular: Negative.   Gastrointestinal: Negative.   Endocrine: Negative.   Genitourinary: Negative.     Musculoskeletal: Negative.   Skin: Negative.   Allergic/Immunologic: Negative.   Neurological: Negative.   Hematological: Negative.   Psychiatric/Behavioral: Negative.        Objective:   Physical Exam  Nursing note and vitals reviewed. Constitutional: He is oriented to person, place, and time. He appears well-developed and well-nourished. No distress.  HENT:  Head: Normocephalic and atraumatic.  Right Ear: External ear normal.  Left Ear: External ear normal.  Nose: Nose normal.  Mouth/Throat: Oropharynx is clear and moist. No oropharyngeal exudate.  Edentulous  Eyes: Conjunctivae and EOM are normal. Pupils are equal, round, and reactive to light. Right eye exhibits no discharge. Left eye exhibits no discharge. No scleral icterus.  Neck: Normal range of motion. Neck supple. No tracheal deviation present. No thyromegaly present.  The patient has bilateral carotid bruits  Cardiovascular: Normal rate, regular rhythm, normal heart sounds and intact distal pulses.  Exam reveals no gallop and no friction rub.   No murmur heard. At 84 per minute  Pulmonary/Chest: Effort normal and breath sounds normal. No respiratory distress. He has no wheezes. He has no rales. He exhibits no tenderness.  Abdominal: Soft. Bowel sounds are normal. He exhibits no mass. There is no tenderness. There is no rebound and no guarding.   Patient has an abdominal bruit  Musculoskeletal: Normal range of motion. He exhibits no edema and no tenderness.  Lymphadenopathy:  He has no cervical adenopathy.  Neurological: He is alert and oriented to person, place, and time. He has normal reflexes. No cranial nerve deficit.  Skin: Skin is warm and dry. No rash noted. No erythema. No pallor.  Psychiatric: He has a normal mood and affect. His behavior is normal. Judgment and thought content normal.   BP 130/80  Pulse 71  Temp(Src) 97.1 F (36.2 C) (Oral)  Ht 5' 6.25" (1.683 m)  Wt 115 lb (52.164 kg)  BMI 18.42  kg/m2        Assessment & Plan:   1. DIABETES MELLITUS - POCT glycosylated hemoglobin (Hb A1C) - CBC With differential/Platelet - POCT UA - Microalbumin  2. COPD UNSPECIFIED - CBC With differential/Platelet  3. CAD - CBC With differential/Platelet  4. HYPERLIPIDEMIA - NMR, lipoprofile - CBC With differential/Platelet  5. HYPERTENSION - Hepatic function panel - BMP8+EGFR - CBC With differential/Platelet - POCT UA - Microalbumin  6. Vitamin D deficiency - Vit D  25 hydroxy (rtn osteoporosis monitoring) - CBC With differential/Platelet  7. C V A / STROKE  8. Carotid bruit  9. Abdominal bruit  No orders of the defined types were placed in this encounter.   Patient Instructions  Continue current medications. Continue good therapeutic lifestyle changes which include good diet and exercise. Fall precautions discussed with patient. Schedule your flu vaccine if you haven't had it yet If you are over 59 years old - you may need Prevnar 13 or the adult Pneumonia vaccine. SCHEDULE EYE EXAM AS SOON AS YOU CAN!! Call and check with her insurance regarding the Prevnar vaccine We will call you with the results of the lab work as soon as those results are returned   Nyra Capes MD

## 2013-04-19 LAB — NMR, LIPOPROFILE
Cholesterol: 208 mg/dL — ABNORMAL HIGH
HDL Cholesterol by NMR: 89 mg/dL
HDL Particle Number: 36.4 umol/L
LDL Particle Number: 951 nmol/L
LDL Size: 21.3 nm
LDLC SERPL CALC-MCNC: 107 mg/dL — ABNORMAL HIGH
LP-IR Score: <25
Small LDL Particle Number: <90 nmol/L
Triglycerides by NMR: 62 mg/dL

## 2013-04-19 LAB — CBC WITH DIFFERENTIAL
Basophils Absolute: 0.1 10*3/uL (ref 0.0–0.2)
Basos: 1 %
Eosinophils Absolute: 0.1 10*3/uL (ref 0.0–0.4)
Hemoglobin: 13.8 g/dL (ref 12.6–17.7)
Immature Grans (Abs): 0 10*3/uL (ref 0.0–0.1)
Lymphocytes Absolute: 1.9 10*3/uL (ref 0.7–3.1)
Lymphs: 41 %
MCHC: 33.6 g/dL (ref 31.5–35.7)
MCV: 93 fL (ref 79–97)
Monocytes: 5 %
Neutrophils Relative %: 51 %
RBC: 4.42 x10E6/uL (ref 4.14–5.80)
RDW: 13.6 % (ref 12.3–15.4)
WBC: 4.7 10*3/uL (ref 3.4–10.8)

## 2013-04-19 LAB — HEPATIC FUNCTION PANEL
ALT: 15 IU/L (ref 0–44)
AST: 15 IU/L (ref 0–40)
Albumin: 4.3 g/dL (ref 3.5–5.5)
Alkaline Phosphatase: 71 IU/L (ref 39–117)

## 2013-04-19 LAB — VITAMIN D 25 HYDROXY (VIT D DEFICIENCY, FRACTURES): Vit D, 25-Hydroxy: 35.7 ng/mL (ref 30.0–100.0)

## 2013-04-19 LAB — BMP8+EGFR
BUN/Creatinine Ratio: 18 (ref 9–20)
BUN: 19 mg/dL (ref 6–24)
CO2: 22 mmol/L (ref 18–29)
Calcium: 9.9 mg/dL (ref 8.7–10.2)
Chloride: 102 mmol/L (ref 97–108)
Creatinine, Ser: 1.07 mg/dL (ref 0.76–1.27)
Glucose: 105 mg/dL — ABNORMAL HIGH (ref 65–99)

## 2013-04-29 ENCOUNTER — Ambulatory Visit
Admission: RE | Admit: 2013-04-29 | Discharge: 2013-04-29 | Disposition: A | Discharge status: Home / Self Care | Payer: Medicaid Other | Source: Ambulatory Visit | Attending: Family Medicine | Admitting: Family Medicine

## 2013-04-29 DIAGNOSIS — R0989 Other specified symptoms and signs involving the circulatory and respiratory systems: Secondary | ICD-10-CM

## 2013-04-29 DIAGNOSIS — R911 Solitary pulmonary nodule: Secondary | ICD-10-CM

## 2013-05-04 ENCOUNTER — Other Ambulatory Visit: Payer: Self-pay | Admitting: Family Medicine

## 2013-05-05 ENCOUNTER — Ambulatory Visit (INDEPENDENT_AMBULATORY_CARE_PROVIDER_SITE_OTHER): Payer: Medicaid Other | Admitting: Family Medicine

## 2013-05-05 ENCOUNTER — Encounter: Payer: Self-pay | Admitting: Family Medicine

## 2013-05-05 VITALS — BP 134/83 | HR 73 | Temp 96.8°F | Ht 66.25 in | Wt 119.0 lb

## 2013-05-05 DIAGNOSIS — R918 Other nonspecific abnormal finding of lung field: Secondary | ICD-10-CM

## 2013-05-05 NOTE — Patient Instructions (Signed)
We will arrange for a referral for  getting this lesion that was found on the CT scan biopsy The patient will take the report with him

## 2013-05-05 NOTE — Progress Notes (Signed)
   Subjective:    Patient ID: Adam Warner, male    DOB: 10-03-1955, 58 y.o.   MRN: [id number]  HPI Patient comes in to the visit today to discuss an abnormal CT scan of his chest.   Review of Systems     Objective:   Physical Exam  The time was spent with the patient and his wife discussing the abnormal CT and a right upper lobe spiculated mass that is new since he had his last CT scan. The report was reviewed with the patient and his wife and they both had understanding that further evaluation is necessary. This area will need to be biopsied with a CT PET guided biopsy      Assessment & Plan:  Abnormal CT scan of lung  Patient will be referred for a biopsy Patient Instructions  We will arrange for a referral for  getting this lesion that was found on the CT scan biopsy The patient will take the report with him   Arrie Senate MD

## 2013-05-05 NOTE — Progress Notes (Signed)
Adam Warner scheduled for f/u appointment

## 2013-05-06 ENCOUNTER — Ambulatory Visit (INDEPENDENT_AMBULATORY_CARE_PROVIDER_SITE_OTHER): Payer: Medicaid Other | Admitting: Internal Medicine

## 2013-05-06 ENCOUNTER — Encounter: Payer: Self-pay | Admitting: Internal Medicine

## 2013-05-06 VITALS — BP 118/66 | HR 77 | Temp 97.7°F | Ht 63.5 in | Wt 118.4 lb

## 2013-05-06 DIAGNOSIS — R911 Solitary pulmonary nodule: Secondary | ICD-10-CM

## 2013-05-06 DIAGNOSIS — F17200 Nicotine dependence, unspecified, uncomplicated: Secondary | ICD-10-CM

## 2013-05-06 DIAGNOSIS — J449 Chronic obstructive pulmonary disease, unspecified: Secondary | ICD-10-CM

## 2013-05-06 NOTE — Patient Instructions (Signed)
The key is to stop smoking completely before smoking completely stops you!   Please see patient coordinator before you leave today  to schedule PET scan and we'll call you with results and schedule a biopsy then

## 2013-05-06 NOTE — Assessment & Plan Note (Addendum)
-   05/06/2013 spirometry>  1.67 (55%) ratio 69  He really only has mild copd and could tolerated upper lobectomy if needed but really needs to stop smoking x 2 weeks before surgery to help with mucociliary fxn recovery.

## 2013-05-06 NOTE — Progress Notes (Signed)
   Subjective:    Patient ID: Adam Warner, male    DOB: [birth year]    MRN: [id number]  HPI   5 yowm active smoker referred 05/06/13 to [facility]  by Dr Morrie Sheldon for eval of abn Ct    05/06/2013 1st [facility] office visit/ Ineze Serrao cc chronic  doe x running but tires much  Quicker xlast sev years with activities  like using a chain saw listed spiriva on meds but not taking it because he doesn't think it helps and doesn't really need it. [place] one week prior to OV  After stood up too quickly and fx rib on L so had xrays that showed new nodule confirmed in cT. Does have typical am smoker's cough in am/ worse in winter, mostly mucoid x a tsp but never bloody, seldom purulent.   No obvious day to day or daytime variabilty or assoc  cp or chest tightness, subjective wheeze overt sinus or hb symptoms. No unusual exp hx or h/o childhood pna/ asthma or knowledge of premature birth.  Sleeping ok without nocturnal  or early am exacerbation  of respiratory  c/o's or need for noct saba. Also denies any obvious fluctuation of symptoms with weather or environmental changes or other aggravating or alleviating factors except as outlined above   Current Medications, Allergies, Complete Past Medical History, Past Surgical History, Family History, and Social History were reviewed in [employer] record.      Review of Systems  Constitutional: Negative for fever, chills, activity change, appetite change and unexpected weight change.  HENT: Positive for postnasal drip. Negative for congestion, dental problem, rhinorrhea, sneezing, sore throat, trouble swallowing and voice change.   Eyes: Positive for visual disturbance.  Respiratory: Positive for cough and shortness of breath. Negative for choking.   Cardiovascular: Positive for chest pain. Negative for leg swelling.  Gastrointestinal: Negative for nausea, vomiting and abdominal pain.  Genitourinary: Negative for difficulty  urinating.  Musculoskeletal: Negative for arthralgias.  Skin: Negative for rash.  Psychiatric/Behavioral: Negative for behavioral problems and confusion.       Objective:   Physical Exam amb unkept appearing thin hoarse wm nad but looks much older than stated age   Wt Readings from Last 3 Encounters:  05/06/13 118 lb 6.4 oz (53.706 kg)  05/05/13 119 lb (53.978 kg)  04/18/13 115 lb (52.164 kg)      HEENT mild turbinate edema.  Oropharynx no thrush or excess pnd or cobblestoning.  No JVD or cervical adenopathy. Mild accessory muscle hypertrophy. Trachea midline, nl thryroid. Chest was hyperinflated by percussion with diminished breath sounds and moderate increased exp time without wheeze. Hoover sign positive at mid inspiration. Regular rate and rhythm without murmur gallop or rub or increase P2 or edema.  Abd: no hsm, nl excursion. Ext warm without cyanosis or clubbing.    05/09/13 ct s contrast 1.8 x 1.1 cm spiculated opacity in the lateral right upper lobe,  essentially new, suspicious for primary bronchogenic neoplasm.  Consider percutaneous biopsy and/or PET-CT.  Prior areas of nodular scarring in the right upper and middle lobes  are otherwise unchanged.  Mild paraseptal emphysematous changes with suspected superimposed  mild to moderate chronic interstitial lung disease.  Nondisplaced left lateral 8th rib fracture.          Assessment & Plan:

## 2013-05-07 NOTE — Assessment & Plan Note (Signed)
>   3 min discussion  I took an extended  opportunity with this patient to outline the consequences of continued cigarette use  in airway disorders based on all the data we have from the multiple national lung health studies (perfomed over decades at millions of dollars in cost)  indicating that smoking cessation, not choice of inhalers or physicians, is the most important aspect of his care.

## 2013-05-07 NOTE — Assessment & Plan Note (Signed)
CT very worrisome for neoplasm > best option is to consider for RULobectomy if T surg agrees and this is a T1 lesion (other option excisional bx and RT)  Discussed in detail all the  indications, usual  risks and alternatives  relative to the benefits with patient who agrees to proceed with PET first then T surgery eval

## 2013-05-13 ENCOUNTER — Encounter (HOSPITAL_COMMUNITY): Payer: Self-pay

## 2013-05-13 ENCOUNTER — Encounter (HOSPITAL_COMMUNITY)
Admission: RE | Admit: 2013-05-13 | Discharge: 2013-05-13 | Disposition: A | Discharge status: Home / Self Care | Payer: Medicaid Other | Source: Ambulatory Visit | Attending: Internal Medicine | Admitting: Internal Medicine

## 2013-05-13 ENCOUNTER — Encounter: Payer: Self-pay | Admitting: Internal Medicine

## 2013-05-13 DIAGNOSIS — R911 Solitary pulmonary nodule: Secondary | ICD-10-CM | POA: Insufficient documentation

## 2013-05-13 LAB — GLUCOSE, CAPILLARY: Glucose-Capillary: 104 mg/dL — ABNORMAL HIGH (ref 70–99)

## 2013-05-13 MED ORDER — FLUDEOXYGLUCOSE F - 18 (FDG) INJECTION
18.8000 | Freq: Once | INTRAVENOUS | Status: AC | PRN
  Administered 2013-05-13: 18.8 via INTRAVENOUS

## 2013-05-17 ENCOUNTER — Other Ambulatory Visit: Payer: Self-pay | Admitting: Internal Medicine

## 2013-05-17 ENCOUNTER — Other Ambulatory Visit: Payer: Self-pay

## 2013-05-17 DIAGNOSIS — R911 Solitary pulmonary nodule: Secondary | ICD-10-CM

## 2013-05-23 ENCOUNTER — Telehealth: Payer: Self-pay | Admitting: Family Medicine

## 2013-05-25 ENCOUNTER — Encounter: Payer: Medicaid Other | Admitting: Thoracic Surgery (Cardiothoracic Vascular Surgery)

## 2013-05-31 ENCOUNTER — Institutional Professional Consult (permissible substitution) (INDEPENDENT_AMBULATORY_CARE_PROVIDER_SITE_OTHER): Payer: Medicaid Other | Admitting: Thoracic Surgery (Cardiothoracic Vascular Surgery)

## 2013-05-31 ENCOUNTER — Other Ambulatory Visit: Payer: Self-pay | Admitting: *Deleted

## 2013-05-31 ENCOUNTER — Encounter: Payer: Self-pay | Admitting: Thoracic Surgery (Cardiothoracic Vascular Surgery)

## 2013-05-31 VITALS — BP 166/93 | HR 86 | Resp 16 | Ht 65.0 in | Wt 115.0 lb

## 2013-05-31 DIAGNOSIS — D381 Neoplasm of uncertain behavior of trachea, bronchus and lung: Secondary | ICD-10-CM

## 2013-05-31 DIAGNOSIS — C3490 Malignant neoplasm of unspecified part of unspecified bronchus or lung: Secondary | ICD-10-CM

## 2013-05-31 DIAGNOSIS — J449 Chronic obstructive pulmonary disease, unspecified: Secondary | ICD-10-CM

## 2013-05-31 NOTE — Progress Notes (Signed)
PCP is [name], MD Referring Provider is Tanda Rockers, MD  Chief Complaint  Patient presents with  . Lung Lesion    RULobe ...eval and treat...CT/PET...only had spirometry in the office    HPI: 58 year old gentleman with history of tobacco abuse consultation regarding a right upper lobe mass.  Adam Warner is a 58 year old gentleman with an 80-pack-year history of smoking, COPD, coronary artery disease, carotid artery disease, stroke, and diabetes. He had a left VATS and a lingulectomy by Dr. Arlyce Dice 2007 for what turned out to be inflammatory disease. He had been followed for lung nodules by CT scan. These had not changed over time. He recently had a repeat CT just to ensure stability. All of his prior nodules were stable however there was a new 1.4 x 1.8 cm spiculated mass in the lateral right upper lobe abutting the minor fissure. A PET CT was done, which showed this mass was hypermetabolic. There also was a hypermetabolic hilar lymph node.  He was referred to Dr. Melvyn Novas who felt that he might be a surgical candidate. He is now referred for surgical consideration.  He says that he has a cough that is nonproductive. He denies hemoptysis. He does have wheezing. He was not taking his Spiriva on a regular basis until he saw Dr. Melvyn Novas, but says that he has been using it since then. He gets short of breath with exertion. He says he can walk on level ground for about 10 minutes getting short of breath, but sometimes has to stop because the muscles in his legs are "tired". He thinks he would get short of breath with walking one flight of stairs, but has not done so recently. He says his current weight is 115 pounds, and that is not any different than it was 6 months ago. He has some left-sided chest pain due to recent rib trauma, but denies chest pressure or tightness.  His wife had a stroke yesterday evening he currently is at [facility] on a ventilator. He says that they have been told him  anything about her prognosis at this point.  Past Medical History  Diagnosis Date  . COPD (chronic obstructive pulmonary disease)   . Stroke   . Hypertension   . Diabetes mellitus   . Coronary artery disease   . Heart attack 1991    Past Surgical History  Procedure Laterality Date  . Lung surgery      Dr Arlyce Dice  . Angioplasty    . Colonoscopy  10/22/2011    Procedure: COLONOSCOPY;  Surgeon: Rogene Houston, MD;  Location: [facility];  Service: Endoscopy;  Laterality: N/A;  1200  . Neck surgery      Family History  Problem Relation Age of Onset  . Heart disease Father   . Heart disease Sister     Social History History  Substance Use Topics  . Smoking status: Current Every Day Smoker -- 2.00 packs/day for 40 years    Types: Cigarettes  . Smokeless tobacco: Never Used  . Alcohol Use: Yes     Comment: 6 pack beer per wk    Current Outpatient Prescriptions  Medication Sig Dispense Refill  . ACCU-CHEK AVIVA PLUS test strip USE TO CHECK ONCE OR TWICE DAILY  100 each  1  . aspirin EC 325 MG tablet Take 325 mg by mouth every morning.       Marland Kitchen atorvastatin (LIPITOR) 40 MG tablet TAKE ONE TABLET BY MOUTH ONE TIME DAILY  30  tablet  1  . Cholecalciferol (VITAMIN D) 2000 UNITS CAPS Take 1 capsule by mouth daily.      Marland Kitchen DIOVAN 320 MG tablet TAKE ONE TABLET BY MOUTH ONE TIME DAILY  30 tablet  0  . metFORMIN (GLUCOPHAGE) 500 MG tablet Take 1 tablet (500 mg total) by mouth at bedtime.  30 tablet  5  . tiotropium (SPIRIVA) 18 MCG inhalation capsule Place 18 mcg into inhaler and inhale daily.       No current facility-administered medications for this visit.    Allergies  Allergen Reactions  . Zyban [Bupropion Hcl] Other (See Comments)    Seeing black spots    Review of Systems  Respiratory: Positive for cough (No hemoptysis), shortness of breath (with exertion) and wheezing.   Cardiovascular: Positive for chest pain (Left-sided due to rib trauma).       Claudication "tired  muscles"  Endocrine: Negative.   Genitourinary: Negative.   Neurological: Positive for dizziness (Recent fall) and syncope. Negative for weakness.       Stroke in 2011, no residual weakness  Hematological: Negative.   All other systems reviewed and are negative.    BP 166/93  Pulse 86  Resp 16  Ht 5\' 5"  (1.651 m)  Wt 115 lb (52.164 kg)  BMI 19.14 kg/m2  SpO2 98% Physical Exam  Vitals reviewed. Constitutional: He is oriented to person, place, and time. No distress.  thin  HENT:  Head: Normocephalic and atraumatic.  Eyes: EOM are normal. Pupils are equal, round, and reactive to light.  Neck: Neck supple. No tracheal deviation present.  Well healed surgical scar  Cardiovascular: Normal rate, regular rhythm and normal heart sounds.   No murmur heard. Pulmonary/Chest: He has no wheezes. He has no rales. He exhibits tenderness (mild left-sided).  Diminished breath sounds bilaterally  Abdominal: Soft. There is no tenderness.  Musculoskeletal: He exhibits no edema.  Lymphadenopathy:    He has no cervical adenopathy.  Neurological: He is alert and oriented to person, place, and time. No cranial nerve deficit.  Good motor strength all 4 tragedies  Skin: Skin is warm and dry.     Diagnostic Tests: CT chest 04/29/2013 CT CHEST WITHOUT CONTRAST  TECHNIQUE:  Multidetector CT imaging of the chest was performed following the  standard protocol without IV contrast.  COMPARISON: CT chest dated 08/13/2011 and 03/10/2011.  FINDINGS:  1.8 x 1.1 cm spiculated subpleural opacity in the lateral right  upper lobe (series 4/image 20), essentially new, suspicious for  primary bronchogenic neoplasm.  Additional areas of nodular scarring in the right upper lobe and  right middle lobe are unchanged (for example, series 4/ images 15,  18, 20, 24, 36, and 42). Postsurgical changes related to prior left  upper lobe wedge resection.  Mild paraseptal emphysematous changes with suspected  superimposed  mild to moderate chronic interstitial lung disease. No pleural  effusion or pneumothorax.  Visualized thyroid is unremarkable. Surgical clips in the left neck.  The heart is normal in size. No pericardial effusion. Coronary  atherosclerosis. Postsurgical changes related to prior CABG.  Atherosclerotic calcifications of the aortic arch.  8 mm precarinal node (series 3/image 25) previously 7 mm, within  normal limits. No suspicious axillary lymphadenopathy.  Visualized upper abdomen is notable for vascular calcifications.  Mild degenerative changes of the visualized thoracolumbar spine.  Nondisplaced left lateral 8th rib fracture (series 3/ image 49).  IMPRESSION:  1.8 x 1.1 cm spiculated opacity in the lateral right upper lobe,  essentially new, suspicious for primary bronchogenic neoplasm.  Consider percutaneous biopsy and/or PET-CT.  Prior areas of nodular scarring in the right upper and middle lobes  are otherwise unchanged.  Mild paraseptal emphysematous changes with suspected superimposed  mild to moderate chronic interstitial lung disease.  Nondisplaced left lateral 8th rib fracture.  These results were called by telephone at the time of interpretation  on 04/29/2013 at 9:38 AM to Jake Michaelis, nurse at Dr Evelene Croon  office, who verbally acknowledged these results.  Electronically Signed  By: Julian Hy M.D.  On: 04/29/2013 09:38  PET/CT 05/13/2013 NUCLEAR MEDICINE PET SKULL BASE TO THIGH  FASTING BLOOD GLUCOSE: Value: 104mg /dl  TECHNIQUE:  18.8 mCi F-18 FDG was injected intravenously. CT data was obtained  and used for attenuation correction and anatomic localization only.  (This was not acquired as a diagnostic CT examination.) Additional  exam technical data entered on technologist worksheet.  COMPARISON: NM PET TUM IMG SKULL BASE T - THIGH dated 04/07/2005;  CT CHEST W/O CM dated 04/29/2013; CT CHEST W/O CM dated 08/13/2011  FINDINGS:  NECK  No  hypermetabolic lymph nodes in the neck.  CHEST  Within the periphery of the right upper lobe, 17 mm nodule abuts  pleural surface (image 80 4) with metabolic activity (SUV max 5.1).  There are 2 additional small pulmonary nodules in the right upper  lobe 1 measuring 6 mm (image 66) and 1 measuring 6 mm (image 75).  These do not have the clear metabolic activity.  There is a focus of metabolic activity within the right hilum with  insert SUV max 4.0 (image 83). This focus is small and not clearly  identified on this noncontrast CT but is suspicious for a  hypermetabolic lymph node. No additional hypermetabolic lymph nodes  are present. There are small paratracheal lymph nodes which are less  than 10 mm short axis.  ABDOMEN/PELVIS  No abnormal hypermetabolic activity within the liver, pancreas,  adrenal glands, or spleen. No hypermetabolic lymph nodes in the  abdomen or pelvis.  SKELETON  No focal hypermetabolic activity to suggest skeletal metastasis.  IMPRESSION:  1. Hypermetabolic right upper lobe pulmonary nodule abutting the  pleural surface is most consistent with bronchogenic carcinoma.  2. Small focus of hypermetabolic activity in the right hilum is  suspicious for a metastatic lymph node although not clearly  delineated on this noncontrast CT.  3. Two additional small right upper lobe pulmonary nodules do not  have significant metabolic activity.  4. Staging by FDG PET imaging T1a N1 M0  Electronically Signed  By: Suzy Bouchard M.D.  On: 05/13/2013 09:53   Impression: 58 year old smoker with a history of multiple medical problems who has a newly discovered 1.8 x 1.1 cm spiculated right upper lobe mass. This is highly suspicious for primary bronchogenic carcinoma. It is hypermetabolic on PET. There also is a hypermetabolic focus in the right hilum, although no definite adenopathy in that area. This is highly suspicious for stage IIA disease. I had a long discussion with Mr.  Gillingham and reviewed the films with him. We discussed the differential diagnosis, particularly in light of his previous surgery for what turned out to be inflammatory disease. He does understand that this has to be considered a lung cancer unless we can prove otherwise.  He does have significant COPD and has had a previous resection of the lingula on the left side. However his pulmonary function tests show adequate reserve to tolerate a resection. Given his extensive cardiovascular  history, he would need a formal cardiology consultation and clearance prior to undergoing resection. He seen Dr. Angelena Form in the past, but apparently has not seen him in the past 3 years.  I also think it would be prudent to do an MR of the brain, since the PET suggests a possible hilar metastasis.  The confounding factor in his case is his wife's stroke last night. He is obviously very upset about that event. He says that he doesn't think he can have surgery in the near future because of that. I asked him to go ahead with the MR and cardiology evaluation. By that time we should have more information about his wife's status and progress, and he can make a better decision once he has more information.  If at that point he does not wish to pursue surgery, we will discuss biopsy and possible referral for radiation and/or chemotherapy.   Plan: MR brain  Cardiology consultation with Dr. Candise Bowens plan to see him back in 2 weeks to check on our progress

## 2013-06-09 ENCOUNTER — Other Ambulatory Visit: Payer: Self-pay

## 2013-06-09 ENCOUNTER — Other Ambulatory Visit: Payer: Self-pay | Admitting: Thoracic Surgery (Cardiothoracic Vascular Surgery)

## 2013-06-09 DIAGNOSIS — Z139 Encounter for screening, unspecified: Secondary | ICD-10-CM

## 2013-06-13 ENCOUNTER — Ambulatory Visit
Admission: RE | Admit: 2013-06-13 | Discharge: 2013-06-13 | Disposition: A | Discharge status: Home / Self Care | Payer: Medicaid Other | Source: Ambulatory Visit | Attending: Thoracic Surgery (Cardiothoracic Vascular Surgery) | Admitting: Thoracic Surgery (Cardiothoracic Vascular Surgery)

## 2013-06-13 DIAGNOSIS — Z139 Encounter for screening, unspecified: Secondary | ICD-10-CM

## 2013-06-13 DIAGNOSIS — C3490 Malignant neoplasm of unspecified part of unspecified bronchus or lung: Secondary | ICD-10-CM

## 2013-06-13 MED ORDER — GADOBENATE DIMEGLUMINE 529 MG/ML IV SOLN
10.0000 mL | Freq: Once | INTRAVENOUS | Status: AC | PRN
  Administered 2013-06-13: 10 mL via INTRAVENOUS

## 2013-06-15 ENCOUNTER — Other Ambulatory Visit: Payer: Self-pay | Admitting: Family Medicine

## 2013-06-17 ENCOUNTER — Encounter: Payer: Self-pay | Admitting: Cardiovascular Disease

## 2013-06-17 ENCOUNTER — Ambulatory Visit (INDEPENDENT_AMBULATORY_CARE_PROVIDER_SITE_OTHER): Payer: Medicaid Other | Admitting: Cardiovascular Disease

## 2013-06-17 VITALS — BP 145/81 | HR 68 | Ht 65.0 in | Wt 116.1 lb

## 2013-06-17 DIAGNOSIS — F17201 Nicotine dependence, unspecified, in remission: Secondary | ICD-10-CM

## 2013-06-17 DIAGNOSIS — I2510 Atherosclerotic heart disease of native coronary artery without angina pectoris: Secondary | ICD-10-CM

## 2013-06-17 DIAGNOSIS — Z01810 Encounter for preprocedural cardiovascular examination: Secondary | ICD-10-CM

## 2013-06-17 DIAGNOSIS — I10 Essential (primary) hypertension: Secondary | ICD-10-CM

## 2013-06-17 DIAGNOSIS — Z87891 Personal history of nicotine dependence: Secondary | ICD-10-CM

## 2013-06-17 NOTE — Patient Instructions (Signed)
Your physician recommends that you schedule a follow-up appointment in: 3 months.   Your physician has requested that you have an exercise stress myoview. For further information please visit [email or web address]. Please follow instruction sheet, as given.

## 2013-06-17 NOTE — Progress Notes (Signed)
History of Present Illness: 58 yo male with history of CAD s/p angioplasty in the 1990s, HTN, CVA, DM, COPD, carotid artery disease s/p right CEA, tobacco abuse, possible lung cancer who is here today as a new patient for cardiac evaluation prior to planned lung surgery. He was seen in our office in 2011 and had a normal stress test. He has recently been found to have a lung nodule that is suspicious for cancer. He has been seen by Dr. Melvyn Novas in the pulmonary office and then by Dr. Roxan Hockey in [place] surgery. There is discussion about possible lung resection or at least a biopsy followed by chemo/radiation. His wife had a CVA recently and is still in [facility] with paralysis. He does not wish to consider surgery until she is improved. He tells me that he has dyspnea with exertion but no chest pain. He stopped smoking 2 weeks ago (80 pack years). No dizziness, near syncope or syncope.    Primary Care Physician: [name]  Last Lipid Profile:Lipid Panel     Component Value Date/Time   CHOL 208* 04/18/2013 1109   TRIG 57 11/04/2012 1242   LDLCALC 94 11/04/2012 1242     Past Medical History  Diagnosis Date  . COPD (chronic obstructive pulmonary disease)   . Stroke   . Hypertension   . Diabetes mellitus   . Coronary artery disease   . Heart attack 1991  . Pulmonary nodule     Past Surgical History  Procedure Laterality Date  . Lung surgery      Dr Arlyce Dice  . Angioplasty    . Colonoscopy  10/22/2011    Procedure: COLONOSCOPY;  Surgeon: Rogene Houston, MD;  Location: [facility];  Service: Endoscopy;  Laterality: N/A;  1200  . Carotid endarterectomy Right     Current Outpatient Prescriptions  Medication Sig Dispense Refill  . aspirin EC 325 MG tablet Take 325 mg by mouth every morning.       Marland Kitchen atorvastatin (LIPITOR) 40 MG tablet TAKE ONE TABLET BY MOUTH ONE TIME DAILY  30 tablet  1  . Cholecalciferol (VITAMIN D) 2000 UNITS CAPS Take 1 capsule by mouth daily.      .  metFORMIN (GLUCOPHAGE) 500 MG tablet Take 1 tablet (500 mg total) by mouth at bedtime.  30 tablet  5  . tiotropium (SPIRIVA) 18 MCG inhalation capsule Place 18 mcg into inhaler and inhale daily.      . valsartan (DIOVAN) 320 MG tablet TAKE ONE TABLET BY MOUTH ONE TIME DAILY  30 tablet  5   No current facility-administered medications for this visit.    Allergies  Allergen Reactions  . Zyban [Bupropion Hcl] Other (See Comments)    Seeing black spots    History   Social History  . Marital Status: Married    Spouse Name: N/A    Number of Children: 0  . Years of Education: N/A   Occupational History  . Logger    Social History Main Topics  . Smoking status: Former Smoker -- 2.00 packs/day for 40 years    Types: Cigarettes    Quit date: 06/03/2013  . Smokeless tobacco: Never Used  . Alcohol Use: Yes     Comment: 6 pack beer per wk  . Drug Use: No  . Sexual Activity: Yes    Birth Control/ Protection: None   Other Topics Concern  . Not on file   Social History Narrative  . No narrative on  file    Family History  Problem Relation Age of Onset  . Heart disease Father   . Heart disease Sister   . Heart attack Father 23    Review of Systems:  As stated in the HPI and otherwise negative.   BP 145/81  Pulse 68  Ht 5\' 5"  (1.651 m)  Wt 116 lb 1.9 oz (52.672 kg)  BMI 19.32 kg/m2  Physical Examination: General: Well developed, well nourished, NAD HEENT: OP clear, mucus membranes moist SKIN: warm, dry. No rashes. Neuro: No focal deficits Musculoskeletal: Muscle strength 5/5 all ext Psychiatric: Mood and affect normal Neck: No JVD, no carotid bruits, no thyromegaly, no lymphadenopathy. Lungs:Clear bilaterally, no wheezes, rhonci, crackles Cardiovascular: Regular rate and rhythm. No murmurs, gallops or rubs. Abdomen:Soft. Bowel sounds present. Non-tender.  Extremities: No lower extremity edema. Pulses are 2 + in the bilateral DP/PT.  Stress myoview 03/27/2010    Findings:  Stress Procedure  The patient exercised for eight minutes. The patient stopped due to dyspnea with O2 Sat dropping from 100% to 91% at peak exercise. He denied any chest pain. There were no significant ST-T wave changes, only occasional PAC's and rare PVC. Technetium 35m Tetrofosmin was injected at peak exercise and myocardial perfusion imaging was performed after a brief delay.  QPS  Raw Data Images: Normal; no motion artifact; normal heart/lung ratio.  Stress Images: Normal homogeneous uptake in all areas of the myocardium.  Rest Images: Normal homogeneous uptake in all areas of the myocardium.  Subtraction (SDS): No evidence of ischemia.  Transient Ischemic Dilatation: .93 (Normal <1.22)  Lung/Heart Ratio: .27 (Normal <0.45)  Quantitative Gated Spect Images  QGS EDV: 67 ml  QGS ESV: 29 ml  QGS EF: 56 %  QGS cine images: No wall motion abnormalities  Findings  Normal nuclear study  Overall Impression  Exercise Capacity: Good exercise capacity.  BP Response: Hypertensive blood pressure response.  Clinical Symptoms: No chest pain  ECG Impression: No significant ST segment change suggestive of ischemia.  Overall Impression: Normal stress nuclear study.  EKG: NSR, rate 68 bpm. RBBB  Assessment and Plan:   1. CAD: He is known to have CAD with report of angioplasty in the 1990s but no records to document this. He has smoked 80 pack years. Last ischemic testing was a myoview in 2011 which showed no ischemia. He is now planning to have a major thoracic procedure for possible lung cancer. He has had no recent chest pains but does have dyspnea with exertion. With ongoing tobacco abuse until 2 weeks ago, high likelihood of progression of CAD over last 15 years since last cath. Will arrange exercise stress myoview to exclude ischemia and assess LVEF. Continue ASA and statin.   2. Pre-operative cardiovascular examination: See above. Stress test then I will make formal recs regarding  cardiac risk before planned surgical procedure.   3. HTN: BP with reasonable control

## 2013-06-22 ENCOUNTER — Ambulatory Visit (HOSPITAL_COMMUNITY): Payer: Medicaid Other | Attending: Internal Medicine | Admitting: Radiology

## 2013-06-22 ENCOUNTER — Encounter: Payer: Self-pay | Admitting: Internal Medicine

## 2013-06-22 ENCOUNTER — Encounter: Payer: Self-pay | Admitting: Cardiovascular Disease

## 2013-06-22 VITALS — BP 192/96 | HR 67 | Ht 65.0 in | Wt 118.0 lb

## 2013-06-22 DIAGNOSIS — Z9861 Coronary angioplasty status: Secondary | ICD-10-CM | POA: Insufficient documentation

## 2013-06-22 DIAGNOSIS — E119 Type 2 diabetes mellitus without complications: Secondary | ICD-10-CM | POA: Insufficient documentation

## 2013-06-22 DIAGNOSIS — I2510 Atherosclerotic heart disease of native coronary artery without angina pectoris: Secondary | ICD-10-CM | POA: Insufficient documentation

## 2013-06-22 DIAGNOSIS — F17200 Nicotine dependence, unspecified, uncomplicated: Secondary | ICD-10-CM | POA: Insufficient documentation

## 2013-06-22 DIAGNOSIS — I10 Essential (primary) hypertension: Secondary | ICD-10-CM | POA: Insufficient documentation

## 2013-06-22 DIAGNOSIS — R0609 Other forms of dyspnea: Secondary | ICD-10-CM | POA: Insufficient documentation

## 2013-06-22 DIAGNOSIS — E785 Hyperlipidemia, unspecified: Secondary | ICD-10-CM | POA: Insufficient documentation

## 2013-06-22 DIAGNOSIS — Z8249 Family history of ischemic heart disease and other diseases of the circulatory system: Secondary | ICD-10-CM | POA: Insufficient documentation

## 2013-06-22 DIAGNOSIS — Z8673 Personal history of transient ischemic attack (TIA), and cerebral infarction without residual deficits: Secondary | ICD-10-CM | POA: Insufficient documentation

## 2013-06-22 DIAGNOSIS — R0989 Other specified symptoms and signs involving the circulatory and respiratory systems: Secondary | ICD-10-CM | POA: Insufficient documentation

## 2013-06-22 DIAGNOSIS — R42 Dizziness and giddiness: Secondary | ICD-10-CM | POA: Insufficient documentation

## 2013-06-22 DIAGNOSIS — I779 Disorder of arteries and arterioles, unspecified: Secondary | ICD-10-CM | POA: Insufficient documentation

## 2013-06-22 DIAGNOSIS — R0602 Shortness of breath: Secondary | ICD-10-CM

## 2013-06-22 DIAGNOSIS — R55 Syncope and collapse: Secondary | ICD-10-CM | POA: Insufficient documentation

## 2013-06-22 DIAGNOSIS — I252 Old myocardial infarction: Secondary | ICD-10-CM | POA: Insufficient documentation

## 2013-06-22 DIAGNOSIS — Z01810 Encounter for preprocedural cardiovascular examination: Secondary | ICD-10-CM

## 2013-06-22 MED ORDER — TECHNETIUM TC 99M SESTAMIBI GENERIC - CARDIOLITE
10.8000 | Freq: Once | INTRAVENOUS | Status: AC | PRN
  Administered 2013-06-22: 11 via INTRAVENOUS

## 2013-06-22 MED ORDER — TECHNETIUM TC 99M SESTAMIBI GENERIC - CARDIOLITE
33.0000 | Freq: Once | INTRAVENOUS | Status: AC | PRN
  Administered 2013-06-22: 33 via INTRAVENOUS

## 2013-06-22 MED ORDER — REGADENOSON 0.4 MG/5ML IV SOLN
0.4000 mg | Freq: Once | INTRAVENOUS | Status: AC
Start: 2013-06-22 — End: 2013-06-22
  Administered 2013-06-22: 0.4 mg via INTRAVENOUS

## 2013-06-22 NOTE — Progress Notes (Signed)
[facility] [address] [place], [place] [address] [phone]    Cardiology Nuclear Med Study  Adam Warner is a 58 y.o. male     MRN : [id number]     DOB: Sep 29, 1955  Procedure Date: 06/22/2013  Nuclear Med Background Indication for Stress Test:  Evaluation for Ischemia and Surgical Clearance -Lung Surgery- Dr. Modesto Charon History:  CAD, MI, Cath, PTCA, MPI (normal) Cardiac Risk Factors: Carotid Disease, CVA, Family History - CAD, Hypertension, Lipids, NIDDM and Smoker  Symptoms:  Dizziness, DOE and Syncope   Nuclear Pre-Procedure Caffeine/Decaff Intake:  None NPO After: 10:00pm   Lungs:  clear O2 Sat: 97% on room air. IV 0.9% NS with Angio Cath:  22g  IV Site: R Antecubital  IV Started by:  Crissie Figures, RN  Chest Size (in):  38 Cup Size: n/a  Height: 5\' 5"  (1.651 m)  Weight:  118 lb (53.524 kg)  BMI:  Body mass index is 19.64 kg/(m^2). Tech Comments:  N/A    Nuclear Med Study 1 or 2 day study: 1 day  Stress Test Type:  Lexiscan  Reading MD: N/A  Order Authorizing Provider:  Lauree Chandler, MD  Resting Radionuclide: Technetium 36m Sestamibi  Resting Radionuclide Dose: 10.8 mCi   Stress Radionuclide:  Technetium 43m Sestamibi  Stress Radionuclide Dose: 33.0 mCi           Stress Protocol Rest HR: 67 Stress HR: 96  Rest BP: 192/96 Stress BP: 152/72  Exercise Time (min): n/a METS: n/a           Dose of Adenosine (mg):  n/a Dose of Lexiscan: 0.4 mg  Dose of Atropine (mg): n/a Dose of Dobutamine: n/a mcg/kg/min (at max HR)  Stress Test Technologist: Glade Lloyd, BS-ES  Nuclear Technologist:  Charlton Amor, CNMT     Rest Procedure:  Myocardial perfusion imaging was performed at rest 45 minutes following the intravenous administration of Technetium 55m Sestamibi. Rest ECG: Normal sinus rhythm with right bundle branch block  Stress Procedure:  The patient received IV Lexiscan 0.4 mg over 15-seconds.  Technetium 71m  Sestamibi injected at 30-seconds.  Quantitative spect images were obtained after a 45 minute delay.  During the infusion of Lexiscan, the patient complained of feeling SOB.  This resolved in recovery. Stress ECG: No significant change from baseline ECG  QPS Raw Data Images:  Normal; no motion artifact; normal heart/lung ratio. Stress Images:  There is interference from nuclear activity from structures below the diaphragm. This does not affect the ability to read the study. There is normal uptake in all segments. Rest Images:  Normal homogeneous uptake in all areas of the myocardium. Subtraction (SDS):  No evidence of ischemia. Transient Ischemic Dilatation (Normal <1.22):  1.08 Lung/Heart Ratio (Normal <0.45):  0.22  Quantitative Gated Spect Images QGS EDV:  85 ml QGS ESV:  38 ml  Impression Exercise Capacity:  Lexiscan with no exercise. BP Response:  Normal blood pressure response. Clinical Symptoms:  Shortness of breath ECG Impression:  No significant ST segment change suggestive of ischemia. Comparison with Prior Nuclear Study:  This study is compared with the report of the study of December, 2011  Overall Impression:  Normal stress nuclear study. This is a low risk scan. There is no scar or ischemia. There is no significant change since the study of December, 2011.  LV Ejection Fraction: 56%.  LV Wall Motion:  Normal Wall Motion.  Dola Argyle, MD

## 2013-06-28 ENCOUNTER — Telehealth: Payer: Self-pay | Admitting: Cardiovascular Disease

## 2013-06-28 NOTE — Telephone Encounter (Signed)
Left message to call back  

## 2013-06-28 NOTE — Telephone Encounter (Signed)
Follow up         Pt returning nurses call but pt says he will call back

## 2013-06-28 NOTE — Telephone Encounter (Signed)
New message  ° ° °Returning call back to nurse.  °

## 2013-06-29 ENCOUNTER — Encounter: Payer: Self-pay | Admitting: Cardiovascular Disease

## 2013-06-29 NOTE — Telephone Encounter (Signed)
Follow Up  ° °Pt called request a call back to discuss results. Please call  °

## 2013-06-29 NOTE — Telephone Encounter (Signed)
Done. See letters. cdm

## 2013-06-29 NOTE — Telephone Encounter (Signed)
Spoke with pt and reviewed stress test results with him. He is planning on undergoing surgery by Dr. Roxan Hockey.  I told pt Dr. Angelena Form would send Dr. Roxan Hockey a letter clearing him for surgery.

## 2013-07-20 ENCOUNTER — Telehealth: Payer: Self-pay | Admitting: Family Medicine

## 2013-07-20 MED ORDER — ONETOUCH ULTRA 2 W/DEVICE KIT: PACK | Status: AC

## 2013-07-20 NOTE — Telephone Encounter (Signed)
done

## 2013-07-22 ENCOUNTER — Other Ambulatory Visit: Payer: Self-pay | Admitting: Family Medicine

## 2013-07-28 ENCOUNTER — Encounter: Payer: Self-pay | Admitting: Family Medicine

## 2013-07-28 ENCOUNTER — Ambulatory Visit (INDEPENDENT_AMBULATORY_CARE_PROVIDER_SITE_OTHER): Payer: Medicaid Other | Admitting: Family Medicine

## 2013-07-28 VITALS — BP 147/88 | HR 74 | Temp 97.2°F | Ht 65.0 in | Wt 124.0 lb

## 2013-07-28 DIAGNOSIS — E119 Type 2 diabetes mellitus without complications: Secondary | ICD-10-CM

## 2013-07-28 DIAGNOSIS — E785 Hyperlipidemia, unspecified: Secondary | ICD-10-CM

## 2013-07-28 DIAGNOSIS — Z0000 Encounter for general adult medical examination without abnormal findings: Secondary | ICD-10-CM

## 2013-07-28 DIAGNOSIS — R222 Localized swelling, mass and lump, trunk: Secondary | ICD-10-CM

## 2013-07-28 DIAGNOSIS — N400 Enlarged prostate without lower urinary tract symptoms: Secondary | ICD-10-CM

## 2013-07-28 DIAGNOSIS — R918 Other nonspecific abnormal finding of lung field: Secondary | ICD-10-CM

## 2013-07-28 DIAGNOSIS — I10 Essential (primary) hypertension: Secondary | ICD-10-CM

## 2013-07-28 DIAGNOSIS — J449 Chronic obstructive pulmonary disease, unspecified: Secondary | ICD-10-CM

## 2013-07-28 DIAGNOSIS — E559 Vitamin D deficiency, unspecified: Secondary | ICD-10-CM

## 2013-07-28 LAB — POCT CBC
GRANULOCYTE PERCENT: 58.9 % (ref 37–80)
HEMATOCRIT: 40.9 % — AB (ref 43.5–53.7)
HEMOGLOBIN: 13.4 g/dL — AB (ref 14.1–18.1)
Lymph, poc: 1.6 (ref 0.6–3.4)
MCH, POC: 30.6 pg (ref 27–31.2)
MCHC: 32.7 g/dL (ref 31.8–35.4)
MCV: 93.7 fL (ref 80–97)
MPV: 7.3 fL (ref 0–99.8)
POC Granulocyte: 2.8 (ref 2–6.9)
POC LYMPH %: 33.1 % (ref 10–50)
Platelet Count, POC: 243 10*3/uL (ref 142–424)
RBC: 4.4 M/uL — AB (ref 4.69–6.13)
RDW, POC: 14.9 %
WBC: 4.8 10*3/uL (ref 4.6–10.2)

## 2013-07-28 LAB — POCT GLYCOSYLATED HEMOGLOBIN (HGB A1C): Hemoglobin A1C: 5.9

## 2013-07-28 MED ORDER — VALSARTAN 320 MG PO TABS: ORAL_TABLET | ORAL | Status: DC

## 2013-07-28 MED ORDER — ATORVASTATIN CALCIUM 40 MG PO TABS: ORAL_TABLET | ORAL | Status: DC

## 2013-07-28 MED ORDER — ACCU-CHEK FASTCLIX LANCETS MISC: Status: DC

## 2013-07-28 MED ORDER — TIOTROPIUM BROMIDE MONOHYDRATE 18 MCG IN CAPS: 18.0000 ug | ORAL_CAPSULE | Freq: Every day | RESPIRATORY_TRACT | Status: DC

## 2013-07-28 MED ORDER — METFORMIN HCL 500 MG PO TABS: 500.0000 mg | ORAL_TABLET | Freq: Every day | ORAL | Status: DC

## 2013-07-28 NOTE — Patient Instructions (Addendum)
Continue current medications. Continue good therapeutic lifestyle changes which include good diet and exercise. Fall precautions discussed with patient. If an FOBT was given today- please return it to our front desk. If you are over 60 years old - you may need Prevnar 41 or the adult Pneumonia vaccine.  Continue just stay off of the cigarettes Continue followup with the cardiovascular surgeon for your lobectomy Try to get things worked out to speed the surgery a long more quickly

## 2013-07-28 NOTE — Progress Notes (Signed)
Subjective:    Patient ID: Adam Warner, male    DOB: 1955/05/09, 58 y.o.   MRN: [id number]  HPI Patient is here today for annual wellness exam and follow up of chronic medical problems. On a screening exam the patient was found to have a pulmonary nodule which is most likely cancer. Unfortunately the patient's wife had a massive stroke at the time of this diagnosis. She is back at home and he is having to take care of her. He has seen the cardiologist and has been given clearance for a lobectomy by the cardiovascular surgeon. He is waiting on Medicaid for his wife said that when he goes in the hospital for his lobectomy that someone will be able to take care of her. We will attempt to speed this process along talking with social service and expediting this paperwork. He was somewhat tearful in talking about his wife and his condition. He has had no particular complaints today but he is obviously anxious. He has stopped smoking.           Patient Active Problem List   Diagnosis Date Noted  . CAD, NATIVE VESSEL 03/08/2010  . CAROTID ARTERY STENOSIS, WITHOUT INFARCTION 03/08/2010  . LEG PAIN 03/08/2010  . SYNCOPE AND COLLAPSE 03/08/2010  . ABNORMAL CV (STRESS) TEST 03/08/2010  . HYPERLIPIDEMIA 02/12/2010  . SMOKER 02/12/2010  . C V A / STROKE 02/12/2010  . COPD GOLD II 02/12/2010  . Pulmonary nodule 02/12/2010  . ABNORMAL LUNG XRAY 02/12/2010  . DIABETES MELLITUS 02/11/2010  . HYPERTENSION 02/11/2010  . CAD 02/11/2010  . TIA 02/11/2010   Outpatient Encounter Prescriptions as of 07/28/2013  Medication Sig  . aspirin EC 325 MG tablet Take 325 mg by mouth every morning.   Marland Kitchen atorvastatin (LIPITOR) 40 MG tablet TAKE ONE TABLET BY MOUTH ONE TIME DAILY  . Blood Glucose Monitoring Suppl (ONE TOUCH ULTRA 2) W/DEVICE KIT Use monitor to test blood sugar qd Dx 250.00  . Cholecalciferol (VITAMIN D) 2000 UNITS CAPS Take 1 capsule by mouth daily.  . metFORMIN (GLUCOPHAGE) 500 MG tablet Take 1  tablet (500 mg total) by mouth at bedtime.  Marland Kitchen tiotropium (SPIRIVA) 18 MCG inhalation capsule Place 18 mcg into inhaler and inhale daily.  . valsartan (DIOVAN) 320 MG tablet TAKE ONE TABLET BY MOUTH ONE TIME DAILY    Review of Systems  Constitutional: Negative.   HENT: Negative.   Eyes: Negative.   Respiratory: Negative.   Cardiovascular: Negative.   Gastrointestinal: Negative.   Endocrine: Negative.   Genitourinary: Negative.   Musculoskeletal: Negative.   Skin: Negative.   Allergic/Immunologic: Negative.   Neurological: Negative.   Hematological: Negative.   Psychiatric/Behavioral: Negative.        Objective:   Physical Exam  Nursing note and vitals reviewed. Constitutional: He is oriented to person, place, and time. He appears distressed.  Thin and somewhat frail for his stated age of 58  HENT:  Head: Normocephalic and atraumatic.  Right Ear: External ear normal.  Left Ear: External ear normal.  Nose: Nose normal.  Mouth/Throat: Oropharynx is clear and moist. No oropharyngeal exudate.  Eyes: Conjunctivae and EOM are normal. Pupils are equal, round, and reactive to light. Right eye exhibits no discharge. Left eye exhibits no discharge. No scleral icterus.  Neck: Normal range of motion. Neck supple. No thyromegaly present.  Cardiovascular: Normal rate, regular rhythm, normal heart sounds and intact distal pulses.  Exam reveals no gallop and no friction rub.   No  murmur heard. At 84 per minute  Pulmonary/Chest: Effort normal and breath sounds normal. No respiratory distress. He has no wheezes. He has no rales.  No axillary adenopathy  Abdominal: Soft. Bowel sounds are normal. He exhibits no mass. There is no tenderness. There is no rebound and no guarding.  No inguinal adenopathy  Genitourinary: Rectum normal and penis normal.  The prostate was enlarged soft and smooth without any lumps or masses. There were no rectal masses. There was no inguinal hernia. There were no  inguinal nodes. The external genitalia were normal   Musculoskeletal: Normal range of motion. He exhibits no edema.  Lymphadenopathy:    He has no cervical adenopathy.  Neurological: He is alert and oriented to person, place, and time. He has normal reflexes. No cranial nerve deficit.  Skin: Skin is warm and dry. No rash noted. No erythema. No pallor.  Psychiatric: His behavior is normal. Judgment and thought content normal.  Somewhat sad and tearful when discussing his family situation   BP 147/88  Pulse 74  Temp(Src) 97.2 F (36.2 C) (Oral)  Ht _0  (1.651 m)  Wt 124 lb (56.246 kg)  BMI 20.63 kg/m2        Assessment & Plan:  1. COPD GOLD II - POCT CBC  2. DIABETES MELLITUS - POCT CBC - BMP8+EGFR - POCT glycosylated hemoglobin (Hb A1C) - ACCU-CHEK FASTCLIX LANCETS MISC; Check blood sugar bid and prn  Dispense: 102 each; Refill: 10  3. HYPERLIPIDEMIA - POCT CBC - Lipid panel  4. HYPERTENSION - POCT CBC - BMP8+EGFR - Hepatic function panel  5. Vitamin D deficiency - Vit D  25 hydroxy (rtn osteoporosis monitoring)  6. BPH (benign prostatic hyperplasia) - PSA, total and free  7. Annual physical exam  8. Pulmonary mass -Surgery is pending  Patient Instructions  Continue current medications. Continue good therapeutic lifestyle changes which include good diet and exercise. Fall precautions discussed with patient. If an FOBT was given today- please return it to our front desk. If you are over 60 years old - you may need Prevnar 38 or the adult Pneumonia vaccine.  Continue just stay off of the cigarettes Continue followup with the cardiovascular surgeon for your lobectomy Try to get things worked out to speed the surgery a long more quickly   Arrie Senate MD

## 2013-07-29 LAB — BMP8+EGFR
BUN/Creatinine Ratio: 16 (ref 9–20)
BUN: 19 mg/dL (ref 6–24)
CALCIUM: 9.6 mg/dL (ref 8.7–10.2)
CHLORIDE: 100 mmol/L (ref 97–108)
CO2: 24 mmol/L (ref 18–29)
Creatinine, Ser: 1.19 mg/dL (ref 0.76–1.27)
GFR, EST AFRICAN AMERICAN: 77 mL/min/{1.73_m2} (ref >59)
GFR, EST NON AFRICAN AMERICAN: 66 mL/min/{1.73_m2} (ref >59)
GLUCOSE: 114 mg/dL — AB (ref 65–99)
POTASSIUM: 5 mmol/L (ref 3.5–5.2)
Sodium: 139 mmol/L (ref 134–144)

## 2013-07-29 LAB — PSA, TOTAL AND FREE
PSA, Free Pct: 46.3 %
PSA, Free: 0.37 ng/mL
PSA: 0.8 ng/mL (ref 0.0–4.0)

## 2013-07-29 LAB — LIPID PANEL
CHOL/HDL RATIO: 1.9 ratio (ref 0.0–5.0)
CHOLESTEROL TOTAL: 246 mg/dL — AB (ref 100–199)
HDL: 127 mg/dL (ref >39)
LDL Calculated: 110 mg/dL — ABNORMAL HIGH (ref 0–99)
TRIGLYCERIDES: 46 mg/dL (ref 0–149)
VLDL CHOLESTEROL CAL: 9 mg/dL (ref 5–40)

## 2013-07-29 LAB — VITAMIN D 25 HYDROXY (VIT D DEFICIENCY, FRACTURES): Vit D, 25-Hydroxy: 21.5 ng/mL — ABNORMAL LOW (ref 30.0–100.0)

## 2013-07-29 LAB — HEPATIC FUNCTION PANEL
ALK PHOS: 74 IU/L (ref 39–117)
ALT: 22 IU/L (ref 0–44)
AST: 23 IU/L (ref 0–40)
Albumin: 4.4 g/dL (ref 3.5–5.5)
Bilirubin, Direct: 0.13 mg/dL (ref 0.00–0.40)
Total Bilirubin: 0.4 mg/dL (ref 0.0–1.2)
Total Protein: 7.4 g/dL (ref 6.0–8.5)

## 2013-09-07 ENCOUNTER — Encounter: Payer: Self-pay | Admitting: Cardiovascular Disease

## 2013-09-07 ENCOUNTER — Ambulatory Visit (INDEPENDENT_AMBULATORY_CARE_PROVIDER_SITE_OTHER): Payer: Medicaid Other | Admitting: Cardiovascular Disease

## 2013-09-07 VITALS — BP 140/80 | HR 80 | Ht 65.0 in | Wt 126.0 lb

## 2013-09-07 DIAGNOSIS — F17201 Nicotine dependence, unspecified, in remission: Secondary | ICD-10-CM

## 2013-09-07 DIAGNOSIS — I10 Essential (primary) hypertension: Secondary | ICD-10-CM

## 2013-09-07 DIAGNOSIS — I2510 Atherosclerotic heart disease of native coronary artery without angina pectoris: Secondary | ICD-10-CM

## 2013-09-07 DIAGNOSIS — Z01810 Encounter for preprocedural cardiovascular examination: Secondary | ICD-10-CM

## 2013-09-07 DIAGNOSIS — Z87891 Personal history of nicotine dependence: Secondary | ICD-10-CM

## 2013-09-07 NOTE — Progress Notes (Signed)
History of Present Illness: 58 yo male with history of CAD s/p angioplasty in the 1990s, HTN, CVA, DM, COPD, carotid artery disease s/p right CEA, tobacco abuse, possible lung cancer who is here today as a new patient for cardiac evaluation prior to planned lung surgery. He was seen in our office in 2011 and had a normal stress test. He has been found to have a lung nodule that is suspicious for cancer. He has been seen by Dr. Melvyn Novas in the pulmonary office and then by Dr. Roxan Hockey in [facility]. There is discussion about possible lung resection or at least a biopsy followed by chemo/radiation. His wife had a CVA this spring and he is delaying his procedure until she is improved. I saw him February 2015 and he described dyspnea with exertion but no chest pain. He stopped smoking in February. I arranged a stress myoview on 06/22/13 without ischemia.   He is here today for follow up. No chest pain or SOB. He has not started back smoking.   Primary Care Physician: [name]  Last Lipid Profile:Lipid Panel     Component Value Date/Time   CHOL 208* 04/18/2013 1109   TRIG 46 07/28/2013 1105   TRIG 62 04/18/2013 1109   TRIG 57 11/04/2012 1242   HDL 127 07/28/2013 1105   HDL 89 04/18/2013 1109   CHOLHDL 1.9 07/28/2013 1105   LDLCALC 110* 07/28/2013 1105   LDLCALC 107* 04/18/2013 1109   [place] 94 11/04/2012 1242     Past Medical History  Diagnosis Date  . COPD (chronic obstructive pulmonary disease)   . Stroke   . Hypertension   . Diabetes mellitus   . Coronary artery disease   . Heart attack 1991  . Pulmonary nodule     Past Surgical History  Procedure Laterality Date  . Lung surgery      Dr Arlyce Dice  . Angioplasty    . Colonoscopy  10/22/2011    Procedure: COLONOSCOPY;  Surgeon: Rogene Houston, MD;  Location: [facility];  Service: Endoscopy;  Laterality: N/A;  1200  . Carotid endarterectomy Right     Current Outpatient Prescriptions  Medication Sig Dispense Refill  .  ACCU-CHEK FASTCLIX LANCETS MISC Check blood sugar bid and prn  102 each  10  . aspirin EC 325 MG tablet Take 325 mg by mouth every morning.       Marland Kitchen atorvastatin (LIPITOR) 40 MG tablet TAKE ONE TABLET BY MOUTH ONE TIME DAILY  30 tablet  5  . Blood Glucose Monitoring Suppl (ONE TOUCH ULTRA 2) W/DEVICE KIT Use monitor to test blood sugar qd Dx 250.00  1 each  0  . Cholecalciferol (VITAMIN D) 2000 UNITS CAPS Take 1 capsule by mouth daily.      . metFORMIN (GLUCOPHAGE) 500 MG tablet Take 1 tablet (500 mg total) by mouth at bedtime.  30 tablet  5  . tiotropium (SPIRIVA) 18 MCG inhalation capsule Place 1 capsule (18 mcg total) into inhaler and inhale daily.  30 capsule  5  . valsartan (DIOVAN) 320 MG tablet TAKE ONE TABLET BY MOUTH ONE TIME DAILY  30 tablet  5   No current facility-administered medications for this visit.    Allergies  Allergen Reactions  . Zyban [Bupropion Hcl] Other (See Comments)    Seeing black spots    History   Social History  . Marital Status: Married    Spouse Name: N/A    Number of Children: 0  .  Years of Education: N/A   Occupational History  . Logger    Social History Main Topics  . Smoking status: Former Smoker -- 2.00 packs/day for 40 years    Types: Cigarettes    Quit date: 06/03/2013  . Smokeless tobacco: Never Used  . Alcohol Use: Yes     Comment: 6 pack beer per wk  . Drug Use: No  . Sexual Activity: Yes    Birth Control/ Protection: None   Other Topics Concern  . Not on file   Social History Narrative  . No narrative on file    Family History  Problem Relation Age of Onset  . Heart disease Father   . Heart disease Sister   . Heart attack Father 64    Review of Systems:  As stated in the HPI and otherwise negative.   BP 140/80  Pulse 80  Ht $R'5\' 5"'kt$  (1.651 m)  Wt 126 lb (57.153 kg)  BMI 20.97 kg/m2  Physical Examination: General: Well developed, well nourished, NAD HEENT: OP clear, mucus membranes moist SKIN: warm, dry. No  rashes. Neuro: No focal deficits Musculoskeletal: Muscle strength 5/5 all ext Psychiatric: Mood and affect normal Neck: No JVD, no carotid bruits, no thyromegaly, no lymphadenopathy. Lungs:Clear bilaterally, no wheezes, rhonci, crackles Cardiovascular: Regular rate and rhythm. No murmurs, gallops or rubs. Abdomen:Soft. Bowel sounds present. Non-tender.  Extremities: No lower extremity edema. Pulses are 2 + in the bilateral DP/PT.  Stress myoview 06/22/13:  Stress Procedure: The patient received IV Lexiscan 0.4 mg over 15-seconds. Technetium 53m Sestamibi injected at 30-seconds. Quantitative spect images were obtained after a 45 minute delay. During the infusion of Lexiscan, the patient complained of feeling SOB. This resolved in recovery.  Stress ECG: No significant change from baseline ECG  QPS  Raw Data Images: Normal; no motion artifact; normal heart/lung ratio.  Stress Images: There is interference from nuclear activity from structures below the diaphragm. This does not affect the ability to read the study. There is normal uptake in all segments.  Rest Images: Normal homogeneous uptake in all areas of the myocardium.  Subtraction (SDS): No evidence of ischemia.  Transient Ischemic Dilatation (Normal <1.22): 1.08  Lung/Heart Ratio (Normal <0.45): 0.22  Quantitative Gated Spect Images  QGS EDV: 85 ml  QGS ESV: 38 ml  Impression  Exercise Capacity: Lexiscan with no exercise.  BP Response: Normal blood pressure response.  Clinical Symptoms: Shortness of breath  ECG Impression: No significant ST segment change suggestive of ischemia.  Comparison with Prior Nuclear Study: This study is compared with the report of the study of December, 2011  Overall Impression: Normal stress nuclear study. This is a low risk scan. There is no scar or ischemia. There is no significant change since the study of December, 2011.  LV Ejection Fraction: 56%. LV Wall Motion: Normal Wall Motion.   Assessment and  Plan:   1. CAD: He is known to have CAD with report of angioplasty in the 1990s but no records to document this. He has smoked 80 pack years. Last ischemic testing was a myoview 06/22/13 which showed no ischemia. He is now planning to have a major thoracic procedure for possible lung cancer. He can proceed with his planned surgical procedure.  Continue ASA and statin.   2. Pre-operative cardiovascular examination: See above. Stress test without ischemia.   3. HTN: BP with reasonable control

## 2013-09-07 NOTE — Patient Instructions (Signed)
Your physician recommends that you schedule a follow-up appointment as needed with Dr. Angelena Form

## 2013-09-08 ENCOUNTER — Ambulatory Visit (INDEPENDENT_AMBULATORY_CARE_PROVIDER_SITE_OTHER): Payer: Medicaid Other | Admitting: Family Medicine

## 2013-09-08 ENCOUNTER — Encounter: Payer: Self-pay | Admitting: Family Medicine

## 2013-09-08 VITALS — BP 193/99 | HR 89 | Temp 97.4°F | Ht 65.0 in | Wt 125.0 lb

## 2013-09-08 DIAGNOSIS — R222 Localized swelling, mass and lump, trunk: Secondary | ICD-10-CM

## 2013-09-08 DIAGNOSIS — J449 Chronic obstructive pulmonary disease, unspecified: Secondary | ICD-10-CM

## 2013-09-08 DIAGNOSIS — E785 Hyperlipidemia, unspecified: Secondary | ICD-10-CM

## 2013-09-08 DIAGNOSIS — R918 Other nonspecific abnormal finding of lung field: Secondary | ICD-10-CM

## 2013-09-08 DIAGNOSIS — I10 Essential (primary) hypertension: Secondary | ICD-10-CM

## 2013-09-08 NOTE — Progress Notes (Signed)
Subjective:    Patient ID: Adam Warner, male    DOB: Jan 21, 1956, 58 y.o.   MRN: [id number]  HPI Patient here today for 6 week follow up on lungs. Patient is no longer smoking. 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 The patient has seen the pulmonologist and the cardiovascular surgeon for the lung cancer. The cardiovascular surgeon is currently awaiting clearance from the cardiologist so that surgery can be planned. The vascular surgeon is Dr. Roxan Hockey. We just spoke to his office and they will be calling the patient with an appointment to set up the surgery. The patient will arrange to have his wife live in a nursing home and this will be done through her physicians at [facility] in [place]. They are looking at the [facility] in [place] for her while he is incapacitated with his surgery. The patient indicates that his home blood pressures are running in the 110 to 130 over the 70s range. He says that his blood pressure is higher today simply because of the lack of rest he gets during the evening and nighttime because of taking care of his wife.       Patient Active Problem List   Diagnosis Date Noted  . CAD, NATIVE VESSEL 03/08/2010  . CAROTID ARTERY STENOSIS, WITHOUT INFARCTION 03/08/2010  . LEG PAIN 03/08/2010  . SYNCOPE AND COLLAPSE 03/08/2010  . ABNORMAL CV (STRESS) TEST 03/08/2010  . HYPERLIPIDEMIA 02/12/2010  . SMOKER 02/12/2010  . C V A / STROKE 02/12/2010  . COPD GOLD II 02/12/2010  . Pulmonary nodule 02/12/2010  . ABNORMAL LUNG XRAY 02/12/2010  . DIABETES MELLITUS 02/11/2010  . HYPERTENSION 02/11/2010  . CAD 02/11/2010  . TIA 02/11/2010   Outpatient Encounter Prescriptions as of 09/08/2013  Medication Sig  . ACCU-CHEK FASTCLIX LANCETS MISC Check blood sugar bid and prn  . aspirin EC 325 MG tablet Take 325 mg by mouth every morning.   Marland Kitchen atorvastatin (LIPITOR) 40 MG  tablet TAKE ONE TABLET BY MOUTH ONE TIME DAILY  . Blood Glucose Monitoring Suppl (ONE TOUCH ULTRA 2) W/DEVICE KIT Use monitor to test blood sugar qd Dx 250.00  . Cholecalciferol (VITAMIN D) 2000 UNITS CAPS Take 1 capsule by mouth daily.  . metFORMIN (GLUCOPHAGE) 500 MG tablet Take 1 tablet (500 mg total) by mouth at bedtime.  Marland Kitchen tiotropium (SPIRIVA) 18 MCG inhalation capsule Place 1 capsule (18 mcg total) into inhaler and inhale daily.  . valsartan (DIOVAN) 320 MG tablet TAKE ONE TABLET BY MOUTH ONE TIME DAILY    Review of Systems  Constitutional: Negative.   HENT: Negative.   Eyes: Negative.   Respiratory: Negative.   Cardiovascular: Negative.   Gastrointestinal: Negative.   Endocrine: Negative.   Genitourinary: Negative.   Musculoskeletal: Negative.   Skin: Negative.   Allergic/Immunologic: Negative.   Neurological: Negative.   Hematological: Negative.   Psychiatric/Behavioral: Negative.        Objective:   Physical Exam  Nursing note and vitals reviewed. Constitutional: He is oriented to person, place, and time. No distress.  Small framed, quiet an older looking than stated age of 58.  HENT:  Head: Normocephalic and atraumatic.  Right Ear: External ear normal.  Left Ear: External ear normal.  Nose: Nose normal.  Mouth/Throat: Oropharynx is clear and moist. No oropharyngeal exudate.  Eyes: Conjunctivae and EOM are normal. Pupils are equal, round, and reactive to  light. Right eye exhibits no discharge. Left eye exhibits no discharge. No scleral icterus.  Neck: Normal range of motion. Neck supple. No thyromegaly present.  Cardiovascular: Normal rate, regular rhythm and normal heart sounds.   No murmur heard. Pulmonary/Chest: Effort normal and breath sounds normal. No respiratory distress. He has no wheezes. He has no rales. He exhibits no tenderness.  Abdominal: Soft. Bowel sounds are normal. He exhibits no mass. There is no tenderness. There is no rebound and no guarding.    Musculoskeletal: Normal range of motion. He exhibits no edema.  Lymphadenopathy:    He has no cervical adenopathy.  Neurological: He is alert and oriented to person, place, and time. No cranial nerve deficit.  Skin: Skin is warm and dry. No rash noted.  Psychiatric: He has a normal mood and affect. His behavior is normal. Judgment and thought content normal.  The patient remains somewhat depressed over the medical conditions which are effacing him and his wife. He was reassured to call us if he has any problems with scheduling or seeing the intended physicians to get his condition taken care of.   BP 193/99  Pulse 89  Temp(Src) 97.4 F (36.3 C) (Oral)  Ht $R'5\' 5"'MZ$  (1.651 m)  Wt 125 lb (56.7 kg)  BMI 20.80 kg/m2  Repeat blood pressure 158/96      Assessment & Plan:  1. COPD mixed type  2. Hypertension  3. Hyperlipemia  4. Pulmonary nodules  5. Pulmonary mass -Patient is awaiting appointment from his vascular surgeon, Dr. Roxan Hockey  Patient Instructions  Please keep the appointment with the lung/vascular surgeon when they call you with this appointment. Continue to monitor your blood pressure at home Continue to avoid sodium intake Remember to let your wife this doctor's know you're date of surgery so that  they can arrange for her to be admitted to a nursing center while you are in for your lung surgery Continue to not smoke   Arrie Senate MD

## 2013-09-08 NOTE — Patient Instructions (Signed)
Please keep the appointment with the lung/vascular surgeon when they call you with this appointment. Continue to monitor your blood pressure at home Continue to avoid sodium intake Remember to let your wife this doctor's know you're date of surgery so that  they can arrange for her to be admitted to a nursing center while you are in for your lung surgery Continue to not smoke

## 2013-09-13 ENCOUNTER — Ambulatory Visit (INDEPENDENT_AMBULATORY_CARE_PROVIDER_SITE_OTHER): Payer: Medicaid Other | Admitting: Thoracic Surgery (Cardiothoracic Vascular Surgery)

## 2013-09-13 ENCOUNTER — Encounter: Payer: Self-pay | Admitting: Thoracic Surgery (Cardiothoracic Vascular Surgery)

## 2013-09-13 VITALS — BP 189/100 | HR 88 | Resp 16 | Ht 65.0 in | Wt 115.0 lb

## 2013-09-13 DIAGNOSIS — D381 Neoplasm of uncertain behavior of trachea, bronchus and lung: Secondary | ICD-10-CM

## 2013-09-13 NOTE — Progress Notes (Signed)
HPI: [name] returns today to discuss his right upper lobe mass.  He is a 58 year old gentleman with history of tobacco abuse, COPD, coronary artery disease, carotid artery disease, stroke, and diabetes. He had a left VATS and a lingulectomy by Dr. Arlyce Dice 2007 for what turned out to be inflammatory disease. He had been followed for lung nodules by CT scan. These had not changed over time. In January he had a repeat CT to ensure stability of the nodules. All of his prior nodules were stable, however there was a new 1.4 x 1.8 cm spiculated mass in the lateral right upper lobe abutting the minor fissure. A PET CT was done, which showed this mass was hypermetabolic. There also was a hypermetabolic hilar lymph node.   He was referred to Dr. Melvyn Novas who felt that he might be a surgical candidate. I saw him in February and recommended surgery. His wife had just had a major stroke the day before his appointment with me. He refused to have surgery or any further workup at that time. His wife ended up with a right hemiplegia. He still is the primary care taker. He takes her therapy 3 times a week.  He recently saw Dr. Angelena Form who felt that he was stable from a cardiac standpoint.  He says that he stopped smoking after he saw me in February. He still has a cough that is nonproductive. It has improved a little bit since he stopped smoking. He denies hemoptysis. He does have wheezing. He gets short of breath with exertion. He says he can walk on level ground for about 10 minutes before getting short of breath, but sometimes has to stop because the muscles in his legs are "tired". He thinks he would get short of breath with walking one flight of stairs, but has not done so recently. He denies weight loss. His current weight is 115 pounds unchanged from 6 months ago. He was having some left-sided chest pain due to recent rib trauma in February, but that has resolved. He denies chest pressure or tightness.   Past  Medical History  Diagnosis Date  . COPD (chronic obstructive pulmonary disease)   . Stroke   . Hypertension   . Diabetes mellitus   . Coronary artery disease   . Heart attack 1991  . Pulmonary nodule       Current Outpatient Prescriptions  Medication Sig Dispense Refill  . ACCU-CHEK FASTCLIX LANCETS MISC Check blood sugar bid and prn  102 each  10  . aspirin EC 325 MG tablet Take 325 mg by mouth every morning.       Marland Kitchen atorvastatin (LIPITOR) 40 MG tablet TAKE ONE TABLET BY MOUTH ONE TIME DAILY  30 tablet  5  . Blood Glucose Monitoring Suppl (ONE TOUCH ULTRA 2) W/DEVICE KIT Use monitor to test blood sugar qd Dx 250.00  1 each  0  . Cholecalciferol (VITAMIN D) 2000 UNITS CAPS Take 1 capsule by mouth daily.      . metFORMIN (GLUCOPHAGE) 500 MG tablet Take 1 tablet (500 mg total) by mouth at bedtime.  30 tablet  5  . tiotropium (SPIRIVA) 18 MCG inhalation capsule Place 1 capsule (18 mcg total) into inhaler and inhale daily.  30 capsule  5  . valsartan (DIOVAN) 320 MG tablet TAKE ONE TABLET BY MOUTH ONE TIME DAILY  30 tablet  5   No current facility-administered medications for this visit.    Physical Exam: BP 189/100  Pulse 88  Resp  16  Ht $R'5\' 5"'uB$  (1.651 m)  Wt 115 lb (52.164 kg)  BMI 19.14 kg/m2  SpO2 105% Thin 58 year old male in no acute distress Alert and oriented x3 with no focal neurologic deficits Neck supple without thyromegaly adenopathy Cardiac regular rate and rhythm normal S1 and S2 Lungs diminished breath sounds bilaterally Abdomen soft nontender Extremities without clubbing cyanosis or edema  Diagnostic Tests: CT and PET scan reviewed  Impression: 77 year old former smoker with COPD who was found in January to have a new right upper lobe mass. He also had a hypermetabolic hilar lymph node on PET CT. This most likely represents a stage II lung cancer. Unfortunately, his wife had a major stroke the day before he saw me in the office and he refused to have any  surgery or further workup done at that time. He says that he now is ready to proceed.  Given the four-month interval since his PET/CT, we need to rescan him to make sure there has not been significant progression that would preclude surgical resection.  Plan: CT of chest  I will see him back in the office after the CT to discuss our next step

## 2013-09-21 ENCOUNTER — Other Ambulatory Visit: Payer: Self-pay

## 2013-09-21 DIAGNOSIS — D381 Neoplasm of uncertain behavior of trachea, bronchus and lung: Secondary | ICD-10-CM

## 2013-09-23 ENCOUNTER — Ambulatory Visit (INDEPENDENT_AMBULATORY_CARE_PROVIDER_SITE_OTHER): Payer: Medicaid Other | Admitting: Thoracic Surgery (Cardiothoracic Vascular Surgery)

## 2013-09-23 ENCOUNTER — Ambulatory Visit
Admission: RE | Admit: 2013-09-23 | Discharge: 2013-09-23 | Disposition: A | Discharge status: Home / Self Care | Payer: Medicaid Other | Source: Ambulatory Visit | Attending: Thoracic Surgery (Cardiothoracic Vascular Surgery) | Admitting: Thoracic Surgery (Cardiothoracic Vascular Surgery)

## 2013-09-23 ENCOUNTER — Encounter: Payer: Self-pay | Admitting: Thoracic Surgery (Cardiothoracic Vascular Surgery)

## 2013-09-23 VITALS — BP 121/76 | HR 77 | Resp 20 | Ht 65.0 in | Wt 115.0 lb

## 2013-09-23 DIAGNOSIS — D381 Neoplasm of uncertain behavior of trachea, bronchus and lung: Secondary | ICD-10-CM

## 2013-09-23 MED ORDER — IOHEXOL 300 MG/ML  SOLN
75.0000 mL | Freq: Once | INTRAMUSCULAR | Status: AC | PRN
  Administered 2013-09-23: 75 mL via INTRAVENOUS

## 2013-09-23 NOTE — Progress Notes (Signed)
HPI:  Adam Warner returns today to discuss his right upper lobe mass.   He is a 58 year old gentleman with history of tobacco abuse, COPD, coronary artery disease, carotid artery disease, stroke, and diabetes.   He had a left VATS and a lingulectomy by Dr. Arlyce Dice in 2007 for what turned out to be inflammatory disease. He had been followed for lung nodules by CT scan. These had not changed over time until January when his CT showed stability of all of his prior nodules, but there was a new 1.4 x 1.8 cm spiculated mass in the lateral right upper lobe abutting the minor fissure. A PET CT was done, which showed this mass was hypermetabolic. There also was a hypermetabolic hilar lymph node.   I recommended surgery for a presume stage IIA nonsmall cell carcinoma. The plan would have been to do a right VATS, wedge resection, then an anatomic resection if the frozen showed cancer. He refused at that time as he was dealing with his wife's stroke.   He was lost to follow up for a few months, then reappeared saying he was ready to proceed. I felt we needed to repeat his CT as it had been 3 months since his PET.  Past Medical History  Diagnosis Date  . COPD (chronic obstructive pulmonary disease)   . Stroke   . Hypertension   . Diabetes mellitus   . Coronary artery disease   . Heart attack 1991  . Pulmonary nodule        Current Outpatient Prescriptions  Medication Sig Dispense Refill  . ACCU-CHEK FASTCLIX LANCETS MISC Check blood sugar bid and prn  102 each  10  . aspirin EC 325 MG tablet Take 325 mg by mouth every morning.       Marland Kitchen atorvastatin (LIPITOR) 40 MG tablet TAKE ONE TABLET BY MOUTH ONE TIME DAILY  30 tablet  5  . Blood Glucose Monitoring Suppl (ONE TOUCH ULTRA 2) W/DEVICE KIT Use monitor to test blood sugar qd Dx 250.00  1 each  0  . Cholecalciferol (VITAMIN D) 2000 UNITS CAPS Take 1 capsule by mouth daily.      . metFORMIN (GLUCOPHAGE) 500 MG tablet Take 1 tablet (500 mg total) by  mouth at bedtime.  30 tablet  5  . tiotropium (SPIRIVA) 18 MCG inhalation capsule Place 1 capsule (18 mcg total) into inhaler and inhale daily.  30 capsule  5  . valsartan (DIOVAN) 320 MG tablet TAKE ONE TABLET BY MOUTH ONE TIME DAILY  30 tablet  5   No current facility-administered medications for this visit.    Physical Exam BP 121/76  Pulse 77  Resp 20  Ht $R'5\' 5"'Qx$  (1.651 m)  Wt 115 lb (52.164 kg)  BMI 19.14 kg/m2  SpO2 97% Thin 58 yo man in NAD Neuro A & O x 3, no focal deficit No cervical or supraclavicular adenopathy Cardiac regular rate and rhythm normal S1 and S2 Lungs diminished breath sounds bilaterally  Diagnostic Tests: CT chest 09/23/2013 CT CHEST WITH CONTRAST  TECHNIQUE:  Multidetector CT imaging of the chest was performed during  intravenous contrast administration.  CONTRAST: 24mL OMNIPAQUE IOHEXOL 300 MG/ML SOLN  COMPARISON: PET-CT 05/13/2013, chest CT 04/29/2013 and 08/13/2011  FINDINGS:  Lungs are adequately inflated without focal consolidation or  effusion. There has been an interval decrease in size of patient's  known hypermetabolic nodule over the lateral right upper lobe  abutting the pleural surface which now measures 1.1 x 1.4 cm  (  previously 1.1 x 1.8 cm). Several other small nodules within the  right upper lobe as well as pole pleural-based nodularity over the  posterior lateral right upper lobe are unchanged. Mild peripheral  increased interstitial markings and nodularity within the right  middle lobe is unchanged. There is stable increased peripheral  interstitial markings in the lung bases. There is a post surgical  suture line over the left lung compatible with prior left upper lobe  wedge resection. There is a 4 mm nodular density over the lingula  unchanged. No new pulmonary nodules/masses.  Heart is normal in size. There is calcification of the left main  coronary artery. There is a 1.3 cm right hilar lymph node when  measured short axis  with difficulty assessing change from the prior  exams which were done without intravenous contrast. There are a few  other stable subcentimeter mediastinal lymph nodes. There is no  axillary adenopathy. Remainder of the chest is unchanged. Surgical  clips adjacent the thyroid gland.  Images through the upper abdomen demonstrate a few tiny sub cm  hypodensities within the liver likely cysts. There is calcified  plaque over the abdominal aorta. Remainder the exam is unchanged.  IMPRESSION:  Slight interval decrease in size of patient's known hypermetabolic  nodule abutting the pleural surface over the lateral right upper  lobe measuring 1.1 x 1.4 cm (previously 1.1 x 1.8 cm). Several small  nodules over the right upper lobe, right middle lobe and lingula  which are stable. 1.3 cm right hilar lymph node difficult to  accurately assess interval change due to lack of intravenous  contrast on the prior exams. No mediastinal or axillary adenopathy.  Stable interstitial disease over the mid to lower lungs. Recommend  continued follow-up as clinically indicated.  Postoperative change over the left lung compatible prior left upper  lobe wedge resection.  Atherosclerotic coronary artery disease.  Few tiny subcentimeter liver hypodensities likely cysts.  Electronically Signed  By: Marin Olp M.D.  On: 09/23/2013 15:29  Impression: 58 year old man with a history of heavy tobacco abuse who was found to have a hypermetabolic right upper lobe nodule and a hypermetabolic hilar lymph node back in March. The nodule in that were first discovered on CT in January. There was a time interval when he did not want to have anything done because he was dealing with his wife's illness. He now wishes to address the nodule.   Since it had been 3 months since his most recent scan we did another scan to make sure that there not been progression. My concern was that there would be a progression to the point where he  was not resectable. Surprisingly, the hypermetabolic lung nodule was smaller than it had been back in January. It is more difficult to tell on the hilar node because he did not get contrast with his first scan, but it also appears to be slightly smaller as well.  I discussed the findings with Adam Warner. This would be rather unusual behavior for a lung cancer particularly a stage II lesion. I emphasized that we cannot rule out the possibility of cancer entirely. However the decrease in size over 5 months would be much more consistent with an infectious or inflammatory etiology.  I gave him 3 options.   First would be to proceed with a VATS for wedge resection and possible anatomic resection of the frozen was positive.  The second option would be to try to do a needle biopsy of this nodule.  The final option would be to repeat his CT scan in 3 months to see if there is further resolution or if the nodule were to start growing.  After discussion of the pros and cons of each of these approaches, and taking his family situation into consideration, he wishes to wait and have a repeat CT scan in 3 months.

## 2013-09-26 ENCOUNTER — Telehealth: Payer: Self-pay | Admitting: Family Medicine

## 2013-09-26 NOTE — Telephone Encounter (Signed)
I am aware that they're going to continue to watch the lung with a CT scan in the future

## 2013-09-26 NOTE — Telephone Encounter (Signed)
Said spots on lungs shrinking just wanted to let you know they are going to recheck in 3 months FYI

## 2013-11-09 ENCOUNTER — Other Ambulatory Visit: Payer: Self-pay | Admitting: Family Medicine

## 2013-11-29 ENCOUNTER — Other Ambulatory Visit: Payer: Self-pay | Admitting: *Deleted

## 2013-11-29 DIAGNOSIS — R911 Solitary pulmonary nodule: Secondary | ICD-10-CM

## 2013-12-13 ENCOUNTER — Encounter: Payer: Self-pay | Admitting: Family Medicine

## 2013-12-13 ENCOUNTER — Ambulatory Visit (INDEPENDENT_AMBULATORY_CARE_PROVIDER_SITE_OTHER): Payer: Medicaid Other | Admitting: Family Medicine

## 2013-12-13 VITALS — BP 157/89 | HR 83 | Temp 97.3°F | Ht 65.0 in | Wt 126.0 lb

## 2013-12-13 DIAGNOSIS — E119 Type 2 diabetes mellitus without complications: Secondary | ICD-10-CM

## 2013-12-13 DIAGNOSIS — J449 Chronic obstructive pulmonary disease, unspecified: Secondary | ICD-10-CM

## 2013-12-13 DIAGNOSIS — I2510 Atherosclerotic heart disease of native coronary artery without angina pectoris: Secondary | ICD-10-CM

## 2013-12-13 DIAGNOSIS — E559 Vitamin D deficiency, unspecified: Secondary | ICD-10-CM

## 2013-12-13 DIAGNOSIS — I10 Essential (primary) hypertension: Secondary | ICD-10-CM

## 2013-12-13 DIAGNOSIS — R9389 Abnormal findings on diagnostic imaging of other specified body structures: Secondary | ICD-10-CM

## 2013-12-13 DIAGNOSIS — E785 Hyperlipidemia, unspecified: Secondary | ICD-10-CM

## 2013-12-13 LAB — POCT GLYCOSYLATED HEMOGLOBIN (HGB A1C): HEMOGLOBIN A1C: 5.8

## 2013-12-13 LAB — POCT CBC
GRANULOCYTE PERCENT: 70.8 % (ref 37–80)
HEMATOCRIT: 42.2 % — AB (ref 43.5–53.7)
Hemoglobin: 14.1 g/dL (ref 14.1–18.1)
Lymph, poc: 1.2 (ref 0.6–3.4)
MCH, POC: 31.4 pg — AB (ref 27–31.2)
MCHC: 33.3 g/dL (ref 31.8–35.4)
MCV: 94.3 fL (ref 80–97)
MPV: 7.5 fL (ref 0–99.8)
PLATELET COUNT, POC: 258 10*3/uL (ref 142–424)
POC GRANULOCYTE: 3.1 (ref 2–6.9)
POC LYMPH PERCENT: 26.2 %L (ref 10–50)
RBC: 4.5 M/uL — AB (ref 4.69–6.13)
RDW, POC: 15 %
WBC: 4.4 10*3/uL — AB (ref 4.6–10.2)

## 2013-12-13 MED ORDER — METFORMIN HCL 500 MG PO TABS: 500.0000 mg | ORAL_TABLET | Freq: Every day | ORAL | Status: DC

## 2013-12-13 MED ORDER — ATORVASTATIN CALCIUM 40 MG PO TABS: ORAL_TABLET | ORAL | Status: DC

## 2013-12-13 MED ORDER — TIOTROPIUM BROMIDE MONOHYDRATE 18 MCG IN CAPS: 18.0000 ug | ORAL_CAPSULE | Freq: Every day | RESPIRATORY_TRACT | Status: DC

## 2013-12-13 MED ORDER — VALSARTAN 320 MG PO TABS: ORAL_TABLET | ORAL | Status: DC

## 2013-12-13 NOTE — Progress Notes (Signed)
Subjective:    Patient ID: Adam Warner, male    DOB: 11-15-1955, 58 y.o.   MRN: [id number]  HPI Pt here for follow up and management of chronic medical problems. The patient is doing well overall. He is due to get lab work. He is followed by Dr. Roxan Hockey for his abnormal chest CT and fortunately the area that was seen initially seems to be regressing without surgery. They are still following a lymph node that is present. The patient is somewhat remorseful because his wife remains in the nursing home following a stroke and he is having to spend a lot of time with her.      Patient Active Problem List   Diagnosis Date Noted  . CAD, NATIVE VESSEL 03/08/2010  . CAROTID ARTERY STENOSIS, WITHOUT INFARCTION 03/08/2010  . LEG PAIN 03/08/2010  . SYNCOPE AND COLLAPSE 03/08/2010  . ABNORMAL CV (STRESS) TEST 03/08/2010  . HYPERLIPIDEMIA 02/12/2010  . SMOKER 02/12/2010  . C V A / STROKE 02/12/2010  . COPD GOLD II 02/12/2010  . Pulmonary nodule 02/12/2010  . ABNORMAL LUNG XRAY 02/12/2010  . DIABETES MELLITUS 02/11/2010  . HYPERTENSION 02/11/2010  . CAD 02/11/2010  . TIA 02/11/2010   Outpatient Encounter Prescriptions as of 12/13/2013  Medication Sig  . ACCU-CHEK FASTCLIX LANCETS MISC Check blood sugar bid and prn  . aspirin EC 325 MG tablet Take 325 mg by mouth every morning.   Marland Kitchen atorvastatin (LIPITOR) 40 MG tablet TAKE ONE TABLET BY MOUTH ONE TIME DAILY  . Blood Glucose Monitoring Suppl (ONE TOUCH ULTRA 2) W/DEVICE KIT Use monitor to test blood sugar qd Dx 250.00  . Cholecalciferol (VITAMIN D) 2000 UNITS CAPS Take 1 capsule by mouth daily.  . metFORMIN (GLUCOPHAGE) 500 MG tablet Take 1 tablet (500 mg total) by mouth at bedtime.  Marland Kitchen tiotropium (SPIRIVA) 18 MCG inhalation capsule Place 1 capsule (18 mcg total) into inhaler and inhale daily.  . valsartan (DIOVAN) 320 MG tablet TAKE ONE TABLET BY MOUTH ONE TIME DAILY    Review of Systems  Constitutional: Negative.   HENT: Negative.     Eyes: Negative.   Respiratory: Negative.   Cardiovascular: Negative.   Gastrointestinal: Negative.   Endocrine: Negative.   Genitourinary: Negative.   Musculoskeletal: Negative.   Skin: Negative.   Allergic/Immunologic: Negative.   Neurological: Negative.   Hematological: Negative.   Psychiatric/Behavioral: Negative.        Objective:   Physical Exam  Nursing note and vitals reviewed. Constitutional: He is oriented to person, place, and time. He appears well-developed and well-nourished.  Small framed somewhat older appearing than his stated age  HENT:  Head: Normocephalic and atraumatic.  Right Ear: External ear normal.  Left Ear: External ear normal.  Nose: Nose normal.  Mouth/Throat: Oropharynx is clear and moist. No oropharyngeal exudate.  Eyes: Conjunctivae and EOM are normal. Pupils are equal, round, and reactive to light. Right eye exhibits no discharge. Left eye exhibits no discharge. No scleral icterus.  Neck: Normal range of motion. Neck supple. No tracheal deviation present. No thyromegaly present.  No cervical adenopathy  Cardiovascular: Normal rate, regular rhythm, normal heart sounds and intact distal pulses.  Exam reveals no gallop and no friction rub.   No murmur heard. At 72 per minute  Pulmonary/Chest: Effort normal and breath sounds normal. No respiratory distress. He has no wheezes. He has no rales. He exhibits no tenderness.  No axillary adenopathy  Abdominal: Soft. Bowel sounds are normal. He exhibits no mass.  There is no tenderness. There is no rebound and no guarding.  No inguinal adenopathy  Genitourinary: Rectum normal.  Musculoskeletal: Normal range of motion. He exhibits no edema and no tenderness.  Lymphadenopathy:    He has no cervical adenopathy.  Neurological: He is alert and oriented to person, place, and time. He has normal reflexes. No cranial nerve deficit.  Skin: Skin is warm and dry. No rash noted. No erythema. No pallor.  Psychiatric:  He has a normal mood and affect. His behavior is normal. Judgment and thought content normal.  Somewhat sad and depressed affect   BP 157/89  Pulse 83  Temp(Src) 97.3 F (36.3 C) (Oral)  Ht $R'5\' 5"'uA$  (1.651 m)  Wt 126 lb (57.153 kg)  BMI 20.97 kg/m2        Assessment & Plan:  1. CAD - POCT CBC  2. COPD GOLD II - POCT CBC  3. DIABETES MELLITUS - POCT CBC - POCT glycosylated hemoglobin (Hb A1C)  4. HYPERLIPIDEMIA - POCT CBC - NMR, lipoprofile  5. HYPERTENSION - POCT CBC - BMP8+EGFR - Hepatic function panel  6. Vitamin D deficiency -Vitamin D level today  7. Abnormal chest CT -Continued followup by thoracic surgeon  Meds ordered this encounter  Medications  . valsartan (DIOVAN) 320 MG tablet    Sig: TAKE ONE TABLET BY MOUTH ONE TIME DAILY    Dispense:  30 tablet    Refill:  6  . tiotropium (SPIRIVA) 18 MCG inhalation capsule    Sig: Place 1 capsule (18 mcg total) into inhaler and inhale daily.    Dispense:  30 capsule    Refill:  6  . metFORMIN (GLUCOPHAGE) 500 MG tablet    Sig: Take 1 tablet (500 mg total) by mouth at bedtime.    Dispense:  30 tablet    Refill:  6  . atorvastatin (LIPITOR) 40 MG tablet    Sig: TAKE ONE TABLET BY MOUTH ONE TIME DAILY    Dispense:  30 tablet    Refill:  6   Patient Instructions                       Medicare Annual Wellness Visit   and the medical providers at [facility] strive to bring you the best medical care.  In doing so we not only want to address your current medical conditions and concerns but also to detect new conditions early and prevent illness, disease and health-related problems.    Medicare offers a yearly Wellness Visit which allows our clinical staff to assess your need for preventative services including immunizations, lifestyle education, counseling to decrease risk of preventable diseases and screening for fall risk and other medical concerns.    This visit is provided  free of charge (no copay) for all Medicare recipients. The clinical pharmacists at [facility] have begun to conduct these Wellness Visits which will also include a thorough review of all your medications.    As you primary medical provider recommend that you make an appointment for your Annual Wellness Visit if you have not done so already this year.  You may set up this appointment before you leave today or you may call back ([phone]) and schedule an appointment.  Please make sure when you call that you mention that you are scheduling your Annual Wellness Visit with the clinical pharmacist so that the appointment may be made for the proper length of time.  Continue current medications. Continue good therapeutic lifestyle changes which include good diet and exercise. Fall precautions discussed with patient. If an FOBT was given today- please return it to our front desk. If you are over 18 years old - you may need Prevnar 52 or the adult Pneumonia vaccine.  Flu Shots will be available at our office starting mid- September. Please call and schedule a FLU CLINIC APPOINTMENT.      Arrie Senate MD

## 2013-12-13 NOTE — Patient Instructions (Signed)
Medicare Annual Wellness Visit  [facility] and the medical providers at [facility] strive to bring you the best medical care.  In doing so we not only want to address your current medical conditions and concerns but also to detect new conditions early and prevent illness, disease and health-related problems.    Medicare offers a yearly Wellness Visit which allows our clinical staff to assess your need for preventative services including immunizations, lifestyle education, counseling to decrease risk of preventable diseases and screening for fall risk and other medical concerns.    This visit is provided free of charge (no copay) for all Medicare recipients. The clinical pharmacists at [facility] have begun to conduct these Wellness Visits which will also include a thorough review of all your medications.    As you primary medical provider recommend that you make an appointment for your Annual Wellness Visit if you have not done so already this year.  You may set up this appointment before you leave today or you may call back ([phone]) and schedule an appointment.  Please make sure when you call that you mention that you are scheduling your Annual Wellness Visit with the clinical pharmacist so that the appointment may be made for the proper length of time.     Continue current medications. Continue good therapeutic lifestyle changes which include good diet and exercise. Fall precautions discussed with patient. If an FOBT was given today- please return it to our front desk. If you are over 75 years old - you may need Prevnar 40 or the adult Pneumonia vaccine.  Flu Shots will be available at our office starting mid- September. Please call and schedule a FLU CLINIC APPOINTMENT.

## 2013-12-14 LAB — BMP8+EGFR
BUN/Creatinine Ratio: 18 (ref 9–20)
BUN: 19 mg/dL (ref 6–24)
CALCIUM: 10.2 mg/dL (ref 8.7–10.2)
CHLORIDE: 98 mmol/L (ref 97–108)
CO2: 21 mmol/L (ref 18–29)
Creatinine, Ser: 1.06 mg/dL (ref 0.76–1.27)
GFR, EST AFRICAN AMERICAN: 88 mL/min/{1.73_m2} (ref >59)
GFR, EST NON AFRICAN AMERICAN: 76 mL/min/{1.73_m2} (ref >59)
Glucose: 112 mg/dL — ABNORMAL HIGH (ref 65–99)
Potassium: 4.5 mmol/L (ref 3.5–5.2)
Sodium: 137 mmol/L (ref 134–144)

## 2013-12-14 LAB — HEPATIC FUNCTION PANEL
ALBUMIN: 4.7 g/dL (ref 3.5–5.5)
ALT: 27 IU/L (ref 0–44)
AST: 24 IU/L (ref 0–40)
Alkaline Phosphatase: 83 IU/L (ref 39–117)
Bilirubin, Direct: 0.18 mg/dL (ref 0.00–0.40)
TOTAL PROTEIN: 7.6 g/dL (ref 6.0–8.5)
Total Bilirubin: 0.6 mg/dL (ref 0.0–1.2)

## 2013-12-14 LAB — NMR, LIPOPROFILE
CHOLESTEROL: 251 mg/dL — AB (ref 100–199)
HDL Cholesterol by NMR: 167 mg/dL (ref >39)
HDL PARTICLE NUMBER: 46.2 umol/L (ref >=30.5)
LDL Particle Number: 601 nmol/L (ref <1000)
LDL Size: 21.6 nm (ref >20.5)
LDLC SERPL CALC-MCNC: 73 mg/dL (ref 0–99)
LP-IR Score: <25 (ref <=45)
Small LDL Particle Number: <90 nmol/L (ref <=527)
Triglycerides by NMR: 57 mg/dL (ref 0–149)

## 2013-12-14 LAB — VITAMIN D 25 HYDROXY (VIT D DEFICIENCY, FRACTURES): Vit D, 25-Hydroxy: 42.9 ng/mL (ref 30.0–100.0)

## 2013-12-15 ENCOUNTER — Other Ambulatory Visit: Payer: Medicaid Other

## 2013-12-15 DIAGNOSIS — Z1212 Encounter for screening for malignant neoplasm of rectum: Secondary | ICD-10-CM

## 2013-12-15 NOTE — Progress Notes (Signed)
Lab only 

## 2013-12-17 LAB — FECAL OCCULT BLOOD, IMMUNOCHEMICAL: Fecal Occult Bld: NEGATIVE

## 2013-12-19 ENCOUNTER — Telehealth: Payer: Self-pay | Admitting: Family Medicine

## 2013-12-19 NOTE — Telephone Encounter (Signed)
Message copied by Waverly Ferrari on Mon Dec 19, 2013 10:13 AM ------      Message from: Chipper Herb      Created: Wed Dec 14, 2013  7:15 AM       The blood sugar slightly elevated at 112. The creatinine, the most important kidney function test is within normal limits. Electrolytes including potassium are within normal limits.      All liver function tests are within normal limits      And advanced lipid testing, a total LDL particle number is excellent and at goal at 601. The LDL C. is good at 73. The triglycerides are good at 57.----- continue current treatment and aggressive therapeutic lifestyle changes      The vitamin D level is good at 43.9, continue current treatment ------

## 2013-12-21 ENCOUNTER — Encounter: Payer: Self-pay | Admitting: Family Medicine

## 2013-12-27 ENCOUNTER — Encounter: Payer: Self-pay | Admitting: Thoracic Surgery (Cardiothoracic Vascular Surgery)

## 2013-12-27 ENCOUNTER — Ambulatory Visit
Admission: RE | Admit: 2013-12-27 | Discharge: 2013-12-27 | Disposition: A | Discharge status: Home / Self Care | Payer: Medicaid Other | Source: Ambulatory Visit | Attending: Thoracic Surgery (Cardiothoracic Vascular Surgery) | Admitting: Thoracic Surgery (Cardiothoracic Vascular Surgery)

## 2013-12-27 ENCOUNTER — Ambulatory Visit (INDEPENDENT_AMBULATORY_CARE_PROVIDER_SITE_OTHER): Payer: Medicaid Other | Admitting: Thoracic Surgery (Cardiothoracic Vascular Surgery)

## 2013-12-27 VITALS — BP 152/88 | HR 102 | Resp 16 | Ht 65.0 in | Wt 115.0 lb

## 2013-12-27 DIAGNOSIS — R911 Solitary pulmonary nodule: Secondary | ICD-10-CM

## 2013-12-27 DIAGNOSIS — D381 Neoplasm of uncertain behavior of trachea, bronchus and lung: Secondary | ICD-10-CM

## 2013-12-27 MED ORDER — IOHEXOL 300 MG/ML  SOLN
75.0000 mL | Freq: Once | INTRAMUSCULAR | Status: AC | PRN
  Administered 2013-12-27: 75 mL via INTRAVENOUS

## 2013-12-27 NOTE — Progress Notes (Signed)
HPI:  He is a 58 year old gentleman with history of tobacco abuse, COPD, coronary artery disease, carotid artery disease, stroke, and diabetes.   He had a left VATS and lingulectomy by Dr. Arlyce Dice in 2007 for what turned out to be inflammatory disease. He has been followed for other lung nodules by CT scan. These did not change until January 2015 when his CT showed a new 1.4 x 1.8 cm spiculated mass in the lateral right upper lobe abutting the minor fissure. All of his prior nodules were stable. A PET CT was done, which showed the new mass was hypermetabolic. There also was a hypermetabolic hilar lymph node.   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 The plan would have been to do a right VATS, wedge resection, and then an anatomic resection if the frozen showed cancer. He refused at that time as he was dealing with his wife's stroke.   He returned to the office in June. A repeat CT of the chest showed that the nodule had actually decreased in size. We elected to continue to follow him radiographically.  He says that in the 3 months since his last visit he's been feeling well. His breathing is unchanged. He does get some shortness of breath due to COPD. He's not had any new issues.  Past Medical History  Diagnosis Date  . COPD (chronic obstructive pulmonary disease)   . Stroke   . Hypertension   . Diabetes mellitus   . Coronary artery disease   . Heart attack 1991  . Pulmonary nodule      Current Outpatient Prescriptions  Medication Sig Dispense Refill  . ACCU-CHEK FASTCLIX LANCETS MISC Check blood sugar bid and prn  102 each  10  . aspirin EC 325 MG tablet Take 325 mg by mouth every morning.       Marland Kitchen atorvastatin (LIPITOR) 40 MG tablet TAKE ONE TABLET BY MOUTH ONE TIME DAILY  30 tablet  6  . Blood Glucose Monitoring Suppl (ONE TOUCH ULTRA 2) W/DEVICE KIT Use monitor to test blood sugar qd Dx 250.00  1 each  0  . Cholecalciferol (VITAMIN D) 2000 UNITS CAPS  Take 1 capsule by mouth daily.      . metFORMIN (GLUCOPHAGE) 500 MG tablet Take 1 tablet (500 mg total) by mouth at bedtime.  30 tablet  6  . tiotropium (SPIRIVA) 18 MCG inhalation capsule Place 1 capsule (18 mcg total) into inhaler and inhale daily.  30 capsule  6  . valsartan (DIOVAN) 320 MG tablet TAKE ONE TABLET BY MOUTH ONE TIME DAILY  30 tablet  6   No current facility-administered medications for this visit.    Physical Exam BP 152/88  Pulse 102  Resp 16  Ht _0  (1.651 m)  Wt 115 lb (52.164 kg)  BMI 19.14 kg/m2  SpO2 25% Thin 58 year old male who appears older than stated age. Alert and oriented x3 with no neurologic deficits Lungs with diminished breath sounds bilaterally, no wheezing Cardiac regular rate and rhythm normal S1 and S2 No cervical or suprapubic or adenopathy  Diagnostic Tests: CT CHEST WITH CONTRAST  TECHNIQUE:  Multidetector CT imaging of the chest was performed during  intravenous contrast administration.  CONTRAST: 62m OMNIPAQUE IOHEXOL 300 MG/ML SOLN  COMPARISON: CT 09/23/2013, PET-CT 05/13/2013  FINDINGS:  The nodule of concern in the periphery of the right upper lobe  measures 9 mm x 12 mm compared to 14 mm x 10 mm on CT of  09/23/2013  and 17 mm x 12 mm on PET-CT of 05/13/2013. Overall visually the  lesion is smaller than PET-CT of 05/13/2013. There is a nodular  component medially (image 30, series 4) which appears is prominent  than most recent CT.  The enlarged right hilar lymph node measures 13 mm (image 30, series  3) which compares to 13 mm on CT of 09/23/2013.  Additional scattered pulmonary nodules are not changed in size  compared to prior. There is postsurgical change in the lingula  without new nodularity.  There is a fine reticular pattern lung bases which is also  unchanged.  No axillary supraclavicular lymphadenopathy. Small 7 mm right lower  paratracheal lymph node is unchanged. There is no central pulmonary  embolism.   Review of the upper abdomen demonstrates normal adrenal glands. No  focal hepatic lesion on limited view of the liver.  No aggressive osseous lesion.  IMPRESSION:  1. Stable to decreased size of nodule in the periphery of the right  upper lobe favors benign etiology. Consider followup CT in 3 to 6  months as there is 1 focus of increased nodular prominence.  2. Stable enlarged right hilar lymph nodes over time suggests  inflammatory node.  3. Stable additional pulmonary nodules and postsurgical change in  lingula.  Electronically Signed  By: Suzy Bouchard M.D.  On: 12/27/2013 09:40   Impression: 58 year old gentleman with multiple lung nodules. A dominant nodule in the right upper lobe has overall decreased in size from his last scan 3 months ago. However, there is one area where it is more nodular medially that was previously. I do not think this is no indication for surgery, but does bear close followup.   Plan:  Return in 4 months with CT of chest

## 2014-01-05 ENCOUNTER — Encounter: Payer: Self-pay | Admitting: *Deleted

## 2014-02-23 ENCOUNTER — Ambulatory Visit (INDEPENDENT_AMBULATORY_CARE_PROVIDER_SITE_OTHER): Payer: Medicaid Other

## 2014-02-23 DIAGNOSIS — Z23 Encounter for immunization: Secondary | ICD-10-CM

## 2014-03-20 ENCOUNTER — Other Ambulatory Visit: Payer: Self-pay

## 2014-03-20 DIAGNOSIS — E119 Type 2 diabetes mellitus without complications: Secondary | ICD-10-CM

## 2014-03-20 MED ORDER — ACCU-CHEK FASTCLIX LANCETS MISC: Status: AC

## 2014-03-21 ENCOUNTER — Other Ambulatory Visit: Payer: Self-pay | Admitting: Family Medicine

## 2014-04-05 ENCOUNTER — Other Ambulatory Visit: Payer: Self-pay | Admitting: *Deleted

## 2014-04-05 DIAGNOSIS — R911 Solitary pulmonary nodule: Secondary | ICD-10-CM

## 2014-04-12 ENCOUNTER — Telehealth: Payer: Self-pay | Admitting: *Deleted

## 2014-04-12 ENCOUNTER — Encounter: Payer: Self-pay | Admitting: Family Medicine

## 2014-04-12 ENCOUNTER — Ambulatory Visit (INDEPENDENT_AMBULATORY_CARE_PROVIDER_SITE_OTHER): Payer: Medicaid Other | Admitting: Family Medicine

## 2014-04-12 VITALS — BP 156/92 | HR 93 | Temp 97.7°F | Ht 65.0 in | Wt 123.0 lb

## 2014-04-12 DIAGNOSIS — R0989 Other specified symptoms and signs involving the circulatory and respiratory systems: Secondary | ICD-10-CM

## 2014-04-12 DIAGNOSIS — J449 Chronic obstructive pulmonary disease, unspecified: Secondary | ICD-10-CM

## 2014-04-12 DIAGNOSIS — E119 Type 2 diabetes mellitus without complications: Secondary | ICD-10-CM

## 2014-04-12 DIAGNOSIS — E559 Vitamin D deficiency, unspecified: Secondary | ICD-10-CM

## 2014-04-12 DIAGNOSIS — I10 Essential (primary) hypertension: Secondary | ICD-10-CM

## 2014-04-12 DIAGNOSIS — N400 Enlarged prostate without lower urinary tract symptoms: Secondary | ICD-10-CM

## 2014-04-12 DIAGNOSIS — R918 Other nonspecific abnormal finding of lung field: Secondary | ICD-10-CM

## 2014-04-12 LAB — POCT CBC
GRANULOCYTE PERCENT: 58.1 % (ref 37–80)
HCT, POC: 40.7 % — AB (ref 43.5–53.7)
Hemoglobin: 12.9 g/dL — AB (ref 14.1–18.1)
LYMPH, POC: 1.6 (ref 0.6–3.4)
MCH, POC: 31.2 pg (ref 27–31.2)
MCHC: 31.8 g/dL (ref 31.8–35.4)
MCV: 98.1 fL — AB (ref 80–97)
MPV: 8.6 fL (ref 0–99.8)
POC GRANULOCYTE: 2.6 (ref 2–6.9)
POC LYMPH PERCENT: 37.2 %L (ref 10–50)
Platelet Count, POC: 247 10*3/uL (ref 142–424)
RBC: 4.1 M/uL — AB (ref 4.69–6.13)
RDW, POC: 12.9 %
WBC: 4.4 10*3/uL — AB (ref 4.6–10.2)

## 2014-04-12 LAB — POCT UA - MICROALBUMIN: Microalbumin Ur, POC: NEGATIVE mg/L

## 2014-04-12 LAB — POCT GLYCOSYLATED HEMOGLOBIN (HGB A1C): Hemoglobin A1C: 5.4

## 2014-04-12 NOTE — Addendum Note (Signed)
Addended by: Zannie Cove on: 04/12/2014 11:18 AM   Modules accepted: Orders

## 2014-04-12 NOTE — Patient Instructions (Addendum)
Continue current medications. Continue good therapeutic lifestyle changes which include good diet and exercise. Fall precautions discussed with patient. If an FOBT was given today- please return it to our front desk. If you are over 60 years old - you may need Prevnar 45 or the adult Pneumonia vaccine.  Flu Shots will be available at our office starting mid- September. Please call and schedule a FLU CLINIC APPOINTMENT.   Monitor blood pressures and blood sugars at home. Bring blood pressures by [facility] reviewed in 4 weeks and have nurse check blood pressure here Watch sodium intake Continue to follow up with pulmonologist for abnormal chest x-ray

## 2014-04-12 NOTE — Progress Notes (Signed)
Subjective:    Patient ID: Adam Warner, male    DOB: 1956-01-22, 58 y.o.   MRN: [id number]  HPI Pt here for follow up and management of chronic medical problems. The patient has no complaints his usual. This significant issues with [facility] that he is taking care of a wife at home who is homebound because of a CVA. Adam Warner has also had a suspicious area in the lung which is being followed by the pulmonologist. He is going to check with his insurance regarding the Prevnar vaccine he will get lab work today. The patient indicates that he checks his blood pressures at home and they run between 135/75-80 most of the time. He indicates that last night specifically he was up a lot with his wife. And this makes his blood pressure higher the next day.         Patient Active Problem List   Diagnosis Date Noted  . CAD, NATIVE VESSEL 03/08/2010  . CAROTID ARTERY STENOSIS, WITHOUT INFARCTION 03/08/2010  . LEG PAIN 03/08/2010  . SYNCOPE AND COLLAPSE 03/08/2010  . ABNORMAL CV (STRESS) TEST 03/08/2010  . HYPERLIPIDEMIA 02/12/2010  . SMOKER 02/12/2010  . C V A / STROKE 02/12/2010  . COPD GOLD II 02/12/2010  . Pulmonary nodule 02/12/2010  . ABNORMAL LUNG XRAY 02/12/2010  . DIABETES MELLITUS 02/11/2010  . HYPERTENSION 02/11/2010  . CAD 02/11/2010  . TIA 02/11/2010   Outpatient Encounter Prescriptions as of 04/12/2014  Medication Sig  . ACCU-CHEK AVIVA PLUS test strip USE TO CHECK ONCE OR TWICE DAILY  . ACCU-CHEK FASTCLIX LANCETS MISC Check blood sugar bid and prn  . aspirin EC 325 MG tablet Take 325 mg by mouth every morning.   Marland Kitchen atorvastatin (LIPITOR) 40 MG tablet TAKE ONE TABLET BY MOUTH ONE TIME DAILY  . Blood Glucose Monitoring Suppl (ONE TOUCH ULTRA 2) W/DEVICE KIT Use monitor to test blood sugar qd Dx 250.00  . Cholecalciferol (VITAMIN D) 2000 UNITS CAPS Take 1 capsule by mouth daily.  . metFORMIN (GLUCOPHAGE) 500 MG tablet Take 1 tablet (500 mg total) by mouth at bedtime.  Marland Kitchen tiotropium  (SPIRIVA) 18 MCG inhalation capsule Place 1 capsule (18 mcg total) into inhaler and inhale daily.  . valsartan (DIOVAN) 320 MG tablet TAKE ONE TABLET BY MOUTH ONE TIME DAILY    Review of Systems  Constitutional: Negative.   HENT: Negative.   Eyes: Negative.   Respiratory: Negative.   Cardiovascular: Negative.   Gastrointestinal: Negative.   Endocrine: Negative.   Genitourinary: Negative.   Musculoskeletal: Negative.   Skin: Negative.   Allergic/Immunologic: Negative.   Neurological: Negative.   Hematological: Negative.   Psychiatric/Behavioral: Negative.        Objective:   Physical Exam  Constitutional: He is oriented to person, place, and time. He appears well-developed and well-nourished. No distress.  Small framed alert and quiet in nature.  HENT:  Head: Normocephalic and atraumatic.  Right Ear: External ear normal.  Left Ear: External ear normal.  Mouth/Throat: Oropharynx is clear and moist. No oropharyngeal exudate.  Nasal congestion bilaterally. Edentulous  Eyes: Conjunctivae and EOM are normal. Pupils are equal, round, and reactive to light. Right eye exhibits no discharge. Left eye exhibits no discharge. No scleral icterus.  Neck: Normal range of motion. Neck supple. No thyromegaly present.  The patient does have bilateral carotid bruits and a left supraclavicular bruit. No thyroid enlargement or anterior cervical adenopathy  Cardiovascular: Normal rate, regular rhythm, normal heart sounds and intact distal  pulses.   No murmur heard. Heart is 72/m with a regular rate and rhythm  Pulmonary/Chest: Effort normal and breath sounds normal. No respiratory distress. He has no wheezes. He has no rales. He exhibits no tenderness.  Abdominal: Soft. Bowel sounds are normal. He exhibits no mass. There is no tenderness. There is no rebound and no guarding.  Musculoskeletal: Normal range of motion. He exhibits no edema.  Lymphadenopathy:    He has no cervical adenopathy.    Neurological: He is alert and oriented to person, place, and time. He has normal reflexes. No cranial nerve deficit.  Skin: Skin is warm and dry. No rash noted. No erythema. No pallor.  Psychiatric: He has a normal mood and affect. His behavior is normal. Judgment and thought content normal.  Nursing note and vitals reviewed.  BP 161/97 mmHg  Pulse 93  Temp(Src) 97.7 F (36.5 C) (Oral)  Ht $R'5\' 5"'MO$  (1.651 m)  Wt 123 lb (55.792 kg)  BMI 20.47 kg/m2  Repeat blood pressure 156/92 in the left arm sitting with a normal regular cuff       Assessment & Plan:  1. COPD GOLD II - POCT CBC  2. Vitamin D deficiency - POCT CBC - Vit D  25 hydroxy (rtn osteoporosis monitoring)  3. BPH (benign prostatic hyperplasia) - POCT CBC  4. Type 2 diabetes mellitus without complication - POCT glycosylated hemoglobin (Hb A1C) - POCT CBC - BMP8+EGFR - NMR, lipoprofile  5. Essential hypertension - POCT CBC - BMP8+EGFR - Hepatic function panel - NMR, lipoprofile  6. Bilateral carotid bruits  7. Abnormal CT scan of lung  Patient Instructions  Continue current medications. Continue good therapeutic lifestyle changes which include good diet and exercise. Fall precautions discussed with patient. If an FOBT was given today- please return it to our front desk. If you are over 22 years old - you may need Prevnar 55 or the adult Pneumonia vaccine.  Flu Shots will be available at our office starting mid- September. Please call and schedule a FLU CLINIC APPOINTMENT.   Monitor blood pressures and blood sugars at home. Bring blood pressures by [facility] reviewed in 4 weeks and have nurse check blood pressure here Watch sodium intake Continue to follow up with pulmonologist for abnormal chest x-ray    Arrie Senate MD

## 2014-04-12 NOTE — Telephone Encounter (Signed)
Aware of results. 

## 2014-04-13 LAB — NMR, LIPOPROFILE
CHOLESTEROL: 176 mg/dL (ref 100–199)
HDL CHOLESTEROL BY NMR: 82 mg/dL (ref >39)
HDL Particle Number: 37.4 umol/L (ref >=30.5)
LDL PARTICLE NUMBER: 704 nmol/L (ref <1000)
LDL Size: 21 nm (ref >20.5)
LDL-C: 80 mg/dL (ref 0–99)
LP-IR SCORE: 25 (ref <=45)
Small LDL Particle Number: <90 nmol/L (ref <=527)
Triglycerides by NMR: 69 mg/dL (ref 0–149)

## 2014-04-13 LAB — VITAMIN D 25 HYDROXY (VIT D DEFICIENCY, FRACTURES): Vit D, 25-Hydroxy: 45.2 ng/mL (ref 30.0–100.0)

## 2014-04-13 LAB — HEPATIC FUNCTION PANEL
ALT: 14 IU/L (ref 0–44)
AST: 16 IU/L (ref 0–40)
Albumin: 3.9 g/dL (ref 3.6–4.8)
Alkaline Phosphatase: 90 IU/L (ref 39–117)
Bilirubin, Direct: 0.13 mg/dL (ref 0.00–0.40)
TOTAL PROTEIN: 6.8 g/dL (ref 6.0–8.5)
Total Bilirubin: 0.4 mg/dL (ref 0.0–1.2)

## 2014-04-13 LAB — BMP8+EGFR
BUN/Creatinine Ratio: 12 (ref 10–22)
BUN: 12 mg/dL (ref 8–27)
CALCIUM: 9.4 mg/dL (ref 8.6–10.2)
CHLORIDE: 102 mmol/L (ref 97–108)
CO2: 21 mmol/L (ref 18–29)
Creatinine, Ser: 0.97 mg/dL (ref 0.76–1.27)
GFR calc non Af Amer: 84 mL/min/{1.73_m2} (ref >59)
GFR, EST AFRICAN AMERICAN: 98 mL/min/{1.73_m2} (ref >59)
GLUCOSE: 108 mg/dL — AB (ref 65–99)
POTASSIUM: 4.2 mmol/L (ref 3.5–5.2)
Sodium: 139 mmol/L (ref 134–144)

## 2014-05-02 ENCOUNTER — Other Ambulatory Visit: Payer: Medicaid Other

## 2014-05-02 ENCOUNTER — Ambulatory Visit: Payer: Medicaid Other | Admitting: Thoracic Surgery (Cardiothoracic Vascular Surgery)

## 2014-05-09 ENCOUNTER — Encounter: Payer: Self-pay | Admitting: Thoracic Surgery (Cardiothoracic Vascular Surgery)

## 2014-05-09 ENCOUNTER — Ambulatory Visit (INDEPENDENT_AMBULATORY_CARE_PROVIDER_SITE_OTHER): Payer: Medicaid Other | Admitting: Thoracic Surgery (Cardiothoracic Vascular Surgery)

## 2014-05-09 ENCOUNTER — Ambulatory Visit
Admission: RE | Admit: 2014-05-09 | Discharge: 2014-05-09 | Disposition: A | Discharge status: Home / Self Care | Payer: Medicaid Other | Source: Ambulatory Visit | Attending: Thoracic Surgery (Cardiothoracic Vascular Surgery) | Admitting: Thoracic Surgery (Cardiothoracic Vascular Surgery)

## 2014-05-09 ENCOUNTER — Other Ambulatory Visit: Payer: Self-pay | Admitting: *Deleted

## 2014-05-09 VITALS — BP 118/78 | HR 85 | Resp 16 | Ht 65.0 in | Wt 116.0 lb

## 2014-05-09 DIAGNOSIS — D381 Neoplasm of uncertain behavior of trachea, bronchus and lung: Secondary | ICD-10-CM

## 2014-05-09 DIAGNOSIS — R590 Localized enlarged lymph nodes: Secondary | ICD-10-CM

## 2014-05-09 DIAGNOSIS — R911 Solitary pulmonary nodule: Secondary | ICD-10-CM

## 2014-05-09 NOTE — Progress Notes (Signed)
HPI: [name] returns for a scheduled follow-up visit   Expand All Collapse All     He is a 59 year old gentleman with history of tobacco abuse, COPD, coronary artery disease, carotid artery disease, stroke, and diabetes.   He had a left VATS and lingulectomy by Dr. Arlyce Dice in 2007 for what turned out to be inflammatory disease. He was followed for other lung nodules by CT scan. These did not change until January 2015 when his CT showed a new 1.4 x 1.8 cm spiculated mass in the lateral right upper lobe abutting the minor fissure. All of his prior nodules were stable. A PET CT was done, which showed the new mass was hypermetabolic. There also was a hypermetabolic hilar lymph node.   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 The plan would have been to do a right VATS, wedge resection, and then an anatomic resection if the frozen showed cancer. He refused at that time as he was dealing with his wife's stroke.   He returned to the office in June. A repeat CT of the chest showed that the nodule had actually decreased in size. We elected to continue to follow him radiographically.      I last saw him in September. The dominant nodule was unchanged. There was some increased nodularity medially. The hilar node was unchanged.  Since his last visit he says his been feeling well. He quit smoking in February 2015. He says he has not been smoking at all. He says his weight is stable. His appetite is good. He is not having any new bone or joint pain. He's not having any unusual headaches or visual changes. He still has a lot of social issues related to the care of his wife. He has not had any unusual cough or hemoptysis. He denies chest pain or shortness breath.  Past Medical History  Diagnosis Date  . COPD (chronic obstructive pulmonary disease)   . Stroke   . Hypertension   . Diabetes mellitus   . Coronary artery disease   . Heart attack 1991  . Pulmonary nodule        Current Outpatient Prescriptions  Medication Sig Dispense Refill  . ACCU-CHEK AVIVA PLUS test strip USE TO CHECK ONCE OR TWICE DAILY 100 each 0  . ACCU-CHEK FASTCLIX LANCETS MISC Check blood sugar bid and prn 102 each 0  . aspirin EC 325 MG tablet Take 325 mg by mouth every morning.     Marland Kitchen atorvastatin (LIPITOR) 40 MG tablet TAKE ONE TABLET BY MOUTH ONE TIME DAILY 30 tablet 6  . Blood Glucose Monitoring Suppl (ONE TOUCH ULTRA 2) W/DEVICE KIT Use monitor to test blood sugar qd Dx 250.00 1 each 0  . Cholecalciferol (VITAMIN D) 2000 UNITS CAPS Take 1 capsule by mouth daily.    . metFORMIN (GLUCOPHAGE) 500 MG tablet Take 1 tablet (500 mg total) by mouth at bedtime. 30 tablet 6  . tiotropium (SPIRIVA) 18 MCG inhalation capsule Place 1 capsule (18 mcg total) into inhaler and inhale daily. 30 capsule 6  . valsartan (DIOVAN) 320 MG tablet TAKE ONE TABLET BY MOUTH ONE TIME DAILY 30 tablet 6   No current facility-administered medications for this visit.    Physical Exam BP 118/78 mmHg  Pulse 85  Resp 16  Ht _0  (1.651 m)  Wt 116 lb (52.617 kg)  BMI 19.30 kg/m2  SpO66 49% 59 year old man in no acute distress Thin Alert and oriented 3  No  cervical or supraclavicular adenopathy Cardiac regular rate and rhythm normal S1 and S2, no murmur Lungs distant breath sounds bilaterally, no wheezing Abdomen soft nontender No cyanosis or peripheral edema. Positive early clubbing  Diagnostic Tests: CT CHEST WITHOUT CONTRAST  TECHNIQUE: Multidetector CT imaging of the chest was performed following the standard protocol without IV contrast.  COMPARISON: 12/27/2013  FINDINGS: Mediastinum: The heart size is normal. There is no pericardial effusion. Calcified atherosclerotic disease involves the thoracic aorta as well as the RCA, LAD coronary arteries. The trachea appears patent and is midline. Normal appearance of the esophagus. Right hilar lymph node/mass has increased in size from  previous exam now measuring 2.3 cm, image 28/ series 3. Previously 1.5 cm.  Lungs/Pleura: No pleural effusion identified. There are postoperative changes in volume loss within the left lung compatible with previous wedge resection surgery. No specific features identified to suggest residual or recurrence of tumor within the left lung. Subpleural spiculated opacity within the periphery of the right upper lobe measures 1.2 cm x 0.8, image 27/ series 4. Previously 1.2 x 0.7 cm. Linear opacity within the right upper lobe measures 8 mm, image 22/ series 4. Stable from previous exam. Again noted are mild to moderate changes of centrilobular and paraseptal emphysema. Peripheral and basilar predominant subpleural reticulation identified. Diffuse bronchial wall thickening identified.  Upper Abdomen: Incidental imaging within the upper abdomen is on unremarkable. The visualized portions of the liver, spleen, and adrenal glands are normal. The pancreas is unremarkable.  Musculoskeletal: Review of the visualized osseous structures is on unremarkable. No aggressive lytic or sclerotic bone lesions identified.  IMPRESSION: 1. Right hilar lymph node/perihilar mass has increased in size from previous exam. This is concerning for progressive tumor. Recommend further evaluation with PET-CT. 2. Peripheral nodular opacities within the right lung are not significantly changed from previous exam. 3. Diffuse bronchial wall thickening with emphysema, as above; imaging findings suggestive of underlying COPD. 4. Atherosclerotic disease including multi vessel coronary artery calcifications.   Electronically Signed  By: Kerby Moors M.D.  On: 05/09/2014 09:27   Impression: 59 year old man with a long history of tobacco abuse who has been followed for multiple pulmonary nodules/hilar adenopathy. He had a lung resection in 2007 by Dr. Arlyce Dice for suspected cancer that turned out to be  inflammatory disease. I have now been following him for about a year for lung nodules and hilar adenopathy. I had originally recommended surgery, but he refused at that time due to his wife's illness. Then on subsequent follow-up CTs there was a decrease in the size of the dominant nodule so we continued to follow him radiologically after that.  I personally reviewed the CT and there is some progression of his right hilar adenopathy. I reviewed the CT scan with him and compared to his previous scan. There really has not been any progression of his dominant nodule or any of the other nodules for that matter. However the increase in size of the hilar node is concerning. This could represent a stage II cancer. It also could be an inflammatory or infectious etiology.  I recommended to him that we start by biopsying the hilar node. I recommended that we do bronchoscopy and endobronchial ultrasound as I think this will be the safest way to access the node. I described the procedure to him in detail. He understands this would be done in the operating room under general anesthesia. The procedure would be done through the endotracheal tube and would not require incision. He understands  the risks of the procedure include those of general anesthesia. He understands the procedure specific risks include bleeding, pneumothorax, nondiagnostic or false negative sampling. He accepts the risks and wishes to proceed, but wishes to wait a couple of weeks so that he can make arrangements for his wife's care.  Additional testing such as a PET CT or MR of the brain may be necessary depending on the findings at the time of bronchoscopy/EBUS  Plan:  Bronchoscopy and endobronchial ultrasound on Monday, 05/29/2014  Montez Morita.D.

## 2014-05-23 ENCOUNTER — Encounter (HOSPITAL_COMMUNITY): Payer: Self-pay

## 2014-05-23 ENCOUNTER — Encounter (HOSPITAL_COMMUNITY)
Admission: RE | Admit: 2014-05-23 | Discharge: 2014-05-23 | Disposition: A | Discharge status: Home / Self Care | Payer: Medicaid Other | Source: Ambulatory Visit | Attending: Thoracic Surgery (Cardiothoracic Vascular Surgery) | Admitting: Thoracic Surgery (Cardiothoracic Vascular Surgery)

## 2014-05-23 VITALS — BP 122/79 | HR 85 | Temp 97.5°F | Resp 20 | Ht 65.0 in | Wt 124.6 lb

## 2014-05-23 DIAGNOSIS — I10 Essential (primary) hypertension: Secondary | ICD-10-CM | POA: Diagnosis not present

## 2014-05-23 DIAGNOSIS — Z87891 Personal history of nicotine dependence: Secondary | ICD-10-CM | POA: Insufficient documentation

## 2014-05-23 DIAGNOSIS — E119 Type 2 diabetes mellitus without complications: Secondary | ICD-10-CM | POA: Diagnosis not present

## 2014-05-23 DIAGNOSIS — R590 Localized enlarged lymph nodes: Secondary | ICD-10-CM | POA: Diagnosis not present

## 2014-05-23 DIAGNOSIS — J449 Chronic obstructive pulmonary disease, unspecified: Secondary | ICD-10-CM | POA: Diagnosis not present

## 2014-05-23 DIAGNOSIS — I2510 Atherosclerotic heart disease of native coronary artery without angina pectoris: Secondary | ICD-10-CM | POA: Diagnosis not present

## 2014-05-23 DIAGNOSIS — Z01812 Encounter for preprocedural laboratory examination: Secondary | ICD-10-CM | POA: Diagnosis not present

## 2014-05-23 DIAGNOSIS — I252 Old myocardial infarction: Secondary | ICD-10-CM | POA: Diagnosis not present

## 2014-05-23 HISTORY — DX: Presence of dental prosthetic device (complete) (partial): Z97.2

## 2014-05-23 HISTORY — DX: Complete loss of teeth, unspecified cause, unspecified class: K08.109

## 2014-05-23 HISTORY — DX: Reserved for inherently not codable concepts without codable children: IMO0001

## 2014-05-23 LAB — CBC
HCT: 40.3 % (ref 39.0–52.0)
HEMOGLOBIN: 13.8 g/dL (ref 13.0–17.0)
MCH: 31.7 pg (ref 26.0–34.0)
MCHC: 34.2 g/dL (ref 30.0–36.0)
MCV: 92.4 fL (ref 78.0–100.0)
Platelets: 281 10*3/uL (ref 150–400)
RBC: 4.36 MIL/uL (ref 4.22–5.81)
RDW: 12.6 % (ref 11.5–15.5)
WBC: 5.5 10*3/uL (ref 4.0–10.5)

## 2014-05-23 LAB — PROTIME-INR
INR: 1 (ref 0.00–1.49)
Prothrombin Time: 13.3 seconds (ref 11.6–15.2)

## 2014-05-23 LAB — COMPREHENSIVE METABOLIC PANEL
ALT: 17 U/L (ref 0–53)
AST: 19 U/L (ref 0–37)
Albumin: 3.8 g/dL (ref 3.5–5.2)
Alkaline Phosphatase: 90 U/L (ref 39–117)
Anion gap: 5 (ref 5–15)
BUN: 10 mg/dL (ref 6–23)
CALCIUM: 9.3 mg/dL (ref 8.4–10.5)
CHLORIDE: 106 mmol/L (ref 96–112)
CO2: 25 mmol/L (ref 19–32)
Creatinine, Ser: 1.07 mg/dL (ref 0.50–1.35)
GFR calc Af Amer: 85 mL/min — ABNORMAL LOW (ref >90)
GFR calc non Af Amer: 74 mL/min — ABNORMAL LOW (ref >90)
Glucose, Bld: 139 mg/dL — ABNORMAL HIGH (ref 70–99)
POTASSIUM: 4.1 mmol/L (ref 3.5–5.1)
Sodium: 136 mmol/L (ref 135–145)
TOTAL PROTEIN: 7.2 g/dL (ref 6.0–8.3)
Total Bilirubin: 0.6 mg/dL (ref 0.3–1.2)

## 2014-05-23 LAB — APTT: aPTT: 35 seconds (ref 24–37)

## 2014-05-23 LAB — GLUCOSE, CAPILLARY: GLUCOSE-CAPILLARY: 124 mg/dL — AB (ref 70–99)

## 2014-05-23 NOTE — Pre-Procedure Instructions (Signed)
Adam Warner  05/23/2014   Your procedure is scheduled on:  Monday, February 8  Report to [facility] Admitting at 0530 AM.  Call this number if you have problems the morning of surgery: [phone]              Call the Preadmission department for all other calls before surgery. [phone]  Remember:   Do not eat food or drink liquids after midnight.Sunday night   Take these medicines the morning of surgery with A SIP OF WATER: Spiriva   Do not wear jewelry.  Do not wear lotions, powders,  Perfumes or  deodorant.    Men may shave face and neck.  Do not bring valuables to the hospital.  [name] is not responsible for any belongings or valuables.               Contacts, dentures or bridgework may not be worn into surgery.  Leave suitcase in the car. After surgery it may be brought to your room.  For patients admitted to the hospital, discharge time is determined by your  treatment team.               Patients discharged the day of surgery will not be allowed to drive  home.  Name and phone number of your driver:      Special Instructions: [facility] - Preparing for Surgery  Before surgery, you can play an important role.  Because skin is not sterile, your skin needs to be as free of germs as possible.  You can reduce the number of germs on you skin by washing with CHG (chlorahexidine gluconate) soap before surgery.  CHG is an antiseptic cleaner which kills germs and bonds with the skin to continue killing germs even after washing.  Please DO NOT use if you have an allergy to CHG or antibacterial soaps.  If your skin becomes reddened/irritated stop using the CHG and inform your nurse when you arrive at Short Stay.  Do not shave (including legs and underarms) for at least 48 hours prior to the first CHG shower.  You may shave your face.  Please follow these instructions carefully:   1.  Shower with CHG Soap the night before surgery and the   morning of Surgery.  2.   If you choose to wash your hair, wash your hair first as usual with your  normal shampoo.  3.  After you shampoo, rinse your hair and body thoroughly to remove the   Shampoo.  4.  Use CHG as you would any other liquid soap.  You can apply chg directly to the skin and wash gently with scrungie or a clean washcloth.  5.  Apply the CHG Soap to your body ONLY FROM THE NECK DOWN.    Do not use on open wounds or open sores.  Avoid contact with your eyes,  ears, mouth and genitals (private parts).  Wash genitals (private parts)  with your normal soap.  6.  Wash thoroughly, paying special attention to the area where your surgery will be performed.  7.  Thoroughly rinse your body with warm water from the neck down.  8.  DO NOT shower/wash with your normal soap after using and rinsing off  the CHG Soap.  9.  Pat yourself dry with a clean towel.            10 .  Wear clean pajamas.  11.  Place clean sheets on your bed the night of your first shower and do not   sleep with pets.  Day of Surgery  Do not apply any lotions/deoderants the morning of surgery.  Please wear clean clothes to the hospital/surgery center.     Please read over the following fact sheets that you were given: Pain Booklet, Coughing and Deep Breathing and Surgical Site Infection Prevention

## 2014-05-24 ENCOUNTER — Encounter (HOSPITAL_COMMUNITY): Payer: Self-pay

## 2014-05-24 LAB — HEMOGLOBIN A1C
HEMOGLOBIN A1C: 7.1 % — AB (ref 4.8–5.6)
Mean Plasma Glucose: 157 mg/dL

## 2014-05-24 NOTE — Progress Notes (Signed)
Anesthesia Chart Review:  Patient is a 59 year old male scheduled for video bronchoscopy with endobronchial ultrasound on 05/29/14 by Dr. Roxan Hockey.  History includes smoking is listed (although some records indicate that he quit in 05/2013), COPD, HTN, CVA, DM2, CAD/MI s/p angioplasty in the '90's, right CEA, left VATS/lingulectomy (inflammatory disease) '07. PCP is Dr. [name].  Cardiologist is Dr. Jeb Levering in 08/2013 he felt patient was stable to undergo thoracic surgery.   06/17/13 EKG: NSR, right BBB.  Nuclear stress test 06/22/13: Overall Impression: Normal stress nuclear study. This is a low risk scan. There is no scar or ischemia. There is no significant change since the study of December, 2011. LV Ejection Fraction: 56%. LV Wall Motion: Normal Wall Motion.  04/29/13 carotid duplex: IMPRESSION: No significant interval progression in mild-moderate heterogeneous atherosclerotic plaque bilaterally which results in less than 50% diameter internal carotid artery stenoses.  Preoperative labs noted.  A1C 7.1. He is for a CXR on the day of surgery.    If no acute changes then I anticipate that he can proceed as planned.  George Hugh [facility] Short Stay Center/Anesthesiology Phone [phone] 05/24/2014 10:01 AM

## 2014-05-29 ENCOUNTER — Encounter (HOSPITAL_COMMUNITY): Payer: Self-pay | Admitting: *Deleted

## 2014-05-29 ENCOUNTER — Ambulatory Visit (HOSPITAL_COMMUNITY)
Admission: RE | Admit: 2014-05-29 | Discharge: 2014-05-29 | Disposition: A | Discharge status: Home / Self Care | Payer: Medicaid Other | Source: Ambulatory Visit | Attending: Thoracic Surgery (Cardiothoracic Vascular Surgery) | Admitting: Thoracic Surgery (Cardiothoracic Vascular Surgery)

## 2014-05-29 ENCOUNTER — Ambulatory Visit (HOSPITAL_COMMUNITY): Payer: Medicaid Other | Admitting: Vascular Surgery

## 2014-05-29 ENCOUNTER — Other Ambulatory Visit: Payer: Self-pay | Admitting: *Deleted

## 2014-05-29 ENCOUNTER — Ambulatory Visit (HOSPITAL_COMMUNITY): Payer: Medicaid Other

## 2014-05-29 ENCOUNTER — Ambulatory Visit (HOSPITAL_COMMUNITY): Payer: Medicaid Other | Admitting: Anesthesiology

## 2014-05-29 ENCOUNTER — Encounter (HOSPITAL_COMMUNITY)
Admission: RE | Disposition: A | Discharge status: Home / Self Care | Payer: Medicaid Other | Source: Ambulatory Visit | Attending: Thoracic Surgery (Cardiothoracic Vascular Surgery)

## 2014-05-29 DIAGNOSIS — Z7982 Long term (current) use of aspirin: Secondary | ICD-10-CM | POA: Diagnosis not present

## 2014-05-29 DIAGNOSIS — Z79899 Other long term (current) drug therapy: Secondary | ICD-10-CM | POA: Insufficient documentation

## 2014-05-29 DIAGNOSIS — C3411 Malignant neoplasm of upper lobe, right bronchus or lung: Secondary | ICD-10-CM | POA: Diagnosis not present

## 2014-05-29 DIAGNOSIS — I10 Essential (primary) hypertension: Secondary | ICD-10-CM | POA: Diagnosis not present

## 2014-05-29 DIAGNOSIS — F17200 Nicotine dependence, unspecified, uncomplicated: Secondary | ICD-10-CM | POA: Diagnosis not present

## 2014-05-29 DIAGNOSIS — Z8673 Personal history of transient ischemic attack (TIA), and cerebral infarction without residual deficits: Secondary | ICD-10-CM | POA: Insufficient documentation

## 2014-05-29 DIAGNOSIS — E119 Type 2 diabetes mellitus without complications: Secondary | ICD-10-CM | POA: Insufficient documentation

## 2014-05-29 DIAGNOSIS — R590 Localized enlarged lymph nodes: Secondary | ICD-10-CM

## 2014-05-29 DIAGNOSIS — I252 Old myocardial infarction: Secondary | ICD-10-CM | POA: Insufficient documentation

## 2014-05-29 DIAGNOSIS — R918 Other nonspecific abnormal finding of lung field: Secondary | ICD-10-CM | POA: Diagnosis present

## 2014-05-29 DIAGNOSIS — I739 Peripheral vascular disease, unspecified: Secondary | ICD-10-CM | POA: Diagnosis not present

## 2014-05-29 DIAGNOSIS — J449 Chronic obstructive pulmonary disease, unspecified: Secondary | ICD-10-CM | POA: Diagnosis not present

## 2014-05-29 DIAGNOSIS — I2510 Atherosclerotic heart disease of native coronary artery without angina pectoris: Secondary | ICD-10-CM | POA: Insufficient documentation

## 2014-05-29 HISTORY — PX: VIDEO BRONCHOSCOPY WITH ENDOBRONCHIAL ULTRASOUND: SHX6177

## 2014-05-29 LAB — GLUCOSE, CAPILLARY: GLUCOSE-CAPILLARY: 108 mg/dL — AB (ref 70–99)

## 2014-05-29 SURGERY — BRONCHOSCOPY, WITH EBUS
Anesthesia: General

## 2014-05-29 MED ORDER — ONDANSETRON HCL 4 MG/2ML IJ SOLN
INTRAMUSCULAR | Status: DC | PRN
  Administered 2014-05-29: 4 mg via INTRAVENOUS

## 2014-05-29 MED ORDER — PROPOFOL 10 MG/ML IV BOLUS
INTRAVENOUS | Status: AC
  Filled 2014-05-29: qty 20

## 2014-05-29 MED ORDER — ONDANSETRON HCL 4 MG/2ML IJ SOLN
INTRAMUSCULAR | Status: AC
  Filled 2014-05-29: qty 2

## 2014-05-29 MED ORDER — FENTANYL CITRATE 0.05 MG/ML IJ SOLN: 25.0000 ug | INTRAMUSCULAR | Status: DC | PRN

## 2014-05-29 MED ORDER — ROCURONIUM BROMIDE 100 MG/10ML IV SOLN
INTRAVENOUS | Status: DC | PRN
  Administered 2014-05-29: 40 mg via INTRAVENOUS
  Administered 2014-05-29: 10 mg via INTRAVENOUS

## 2014-05-29 MED ORDER — FENTANYL CITRATE 0.05 MG/ML IJ SOLN
INTRAMUSCULAR | Status: DC | PRN
  Administered 2014-05-29 (×2): 50 ug via INTRAVENOUS

## 2014-05-29 MED ORDER — LIDOCAINE HCL (CARDIAC) 20 MG/ML IV SOLN
INTRAVENOUS | Status: AC
  Filled 2014-05-29: qty 5

## 2014-05-29 MED ORDER — PROMETHAZINE HCL 25 MG/ML IJ SOLN: 6.2500 mg | INTRAMUSCULAR | Status: DC | PRN

## 2014-05-29 MED ORDER — MEPERIDINE HCL 25 MG/ML IJ SOLN: 6.2500 mg | INTRAMUSCULAR | Status: DC | PRN

## 2014-05-29 MED ORDER — 0.9 % SODIUM CHLORIDE (POUR BTL) OPTIME
TOPICAL | Status: DC | PRN
  Administered 2014-05-29: 1000 mL

## 2014-05-29 MED ORDER — NEOSTIGMINE METHYLSULFATE 10 MG/10ML IV SOLN
INTRAVENOUS | Status: AC
  Filled 2014-05-29: qty 1

## 2014-05-29 MED ORDER — EPINEPHRINE HCL 1 MG/ML IJ SOLN
INTRAMUSCULAR | Status: AC
  Filled 2014-05-29: qty 1

## 2014-05-29 MED ORDER — MIDAZOLAM HCL 5 MG/5ML IJ SOLN
INTRAMUSCULAR | Status: DC | PRN
  Administered 2014-05-29: 2 mg via INTRAVENOUS

## 2014-05-29 MED ORDER — LACTATED RINGERS IV SOLN
INTRAVENOUS | Status: DC | PRN
  Administered 2014-05-29 (×2): via INTRAVENOUS

## 2014-05-29 MED ORDER — PHENYLEPHRINE HCL 10 MG/ML IJ SOLN
INTRAMUSCULAR | Status: DC | PRN
  Administered 2014-05-29: 80 ug via INTRAVENOUS
  Administered 2014-05-29: 40 ug via INTRAVENOUS
  Administered 2014-05-29 (×2): 80 ug via INTRAVENOUS
  Administered 2014-05-29: 120 ug via INTRAVENOUS
  Administered 2014-05-29 (×5): 80 ug via INTRAVENOUS

## 2014-05-29 MED ORDER — GLYCOPYRROLATE 0.2 MG/ML IJ SOLN
INTRAMUSCULAR | Status: DC | PRN
  Administered 2014-05-29: .8 mg via INTRAVENOUS

## 2014-05-29 MED ORDER — PROPOFOL 10 MG/ML IV BOLUS
INTRAVENOUS | Status: DC | PRN
  Administered 2014-05-29: 130 mg via INTRAVENOUS

## 2014-05-29 MED ORDER — LIDOCAINE HCL (CARDIAC) 20 MG/ML IV SOLN
INTRAVENOUS | Status: DC | PRN
  Administered 2014-05-29: 20 mg via INTRAVENOUS

## 2014-05-29 MED ORDER — MIDAZOLAM HCL 2 MG/2ML IJ SOLN: 0.5000 mg | Freq: Once | INTRAMUSCULAR | Status: DC | PRN

## 2014-05-29 MED ORDER — FENTANYL CITRATE 0.05 MG/ML IJ SOLN
INTRAMUSCULAR | Status: AC
  Filled 2014-05-29: qty 5

## 2014-05-29 MED ORDER — ROCURONIUM BROMIDE 50 MG/5ML IV SOLN
INTRAVENOUS | Status: AC
  Filled 2014-05-29: qty 1

## 2014-05-29 MED ORDER — GLYCOPYRROLATE 0.2 MG/ML IJ SOLN
INTRAMUSCULAR | Status: AC
  Filled 2014-05-29: qty 2

## 2014-05-29 MED ORDER — NEOSTIGMINE METHYLSULFATE 10 MG/10ML IV SOLN
INTRAVENOUS | Status: DC | PRN
Start: 2014-05-29 — End: 2014-05-29
  Administered 2014-05-29: 5 mg via INTRAVENOUS

## 2014-05-29 MED ORDER — MIDAZOLAM HCL 2 MG/2ML IJ SOLN
INTRAMUSCULAR | Status: AC
  Filled 2014-05-29: qty 2

## 2014-05-29 SURGICAL SUPPLY — 30 items
BALL CTTN LRG ABS STRL LF (GAUZE/BANDAGES/DRESSINGS)
BRUSH CYTOL CELLEBRITY 1.5X140 (MISCELLANEOUS) ×2 IMPLANT
CANISTER SUCTION 2500CC (MISCELLANEOUS) ×2 IMPLANT
CONT SPEC 4OZ CLIKSEAL STRL BL (MISCELLANEOUS) ×2 IMPLANT
COTTONBALL LRG STERILE PKG (GAUZE/BANDAGES/DRESSINGS) IMPLANT
COVER TABLE BACK 60X90 (DRAPES) ×2 IMPLANT
FILTER STRAW FLUID ASPIR (MISCELLANEOUS) IMPLANT
FORCEPS BIOP RJ4 1.8 (CUTTING FORCEPS) ×1 IMPLANT
GAUZE SPONGE 4X4 12PLY STRL (GAUZE/BANDAGES/DRESSINGS) IMPLANT
GLOVE SURG SIGNA 7.5 PF LTX (GLOVE) ×4 IMPLANT
GOWN STRL REUS W/ TWL XL LVL3 (GOWN DISPOSABLE) ×2 IMPLANT
GOWN STRL REUS W/TWL XL LVL3 (GOWN DISPOSABLE) ×4
KIT CLEAN ENDO COMPLIANCE (KITS) ×4 IMPLANT
KIT ROOM TURNOVER OR (KITS) ×2 IMPLANT
MARKER SKIN DUAL TIP RULER LAB (MISCELLANEOUS) ×2 IMPLANT
NEEDLE 22X1 1/2 (OR ONLY) (NEEDLE) IMPLANT
NEEDLE BIOPSY TRANSBRONCH 21G (NEEDLE) IMPLANT
NEEDLE BLUNT 18X1 FOR OR ONLY (NEEDLE) IMPLANT
NEEDLE SYS SONOTIP II EBUSTBNA (NEEDLE) ×3 IMPLANT
NS IRRIG 1000ML POUR BTL (IV SOLUTION) ×2 IMPLANT
OIL SILICONE PENTAX (PARTS (SERVICE/REPAIRS)) IMPLANT
PAD ARMBOARD 7.5X6 YLW CONV (MISCELLANEOUS) ×4 IMPLANT
SYR 20CC LL (SYRINGE) ×2 IMPLANT
SYR 20ML ECCENTRIC (SYRINGE) ×2 IMPLANT
SYR 5ML LL (SYRINGE) ×2 IMPLANT
SYR 5ML LUER SLIP (SYRINGE) IMPLANT
SYR CONTROL 10ML LL (SYRINGE) IMPLANT
TOWEL OR 17X24 6PK STRL BLUE (TOWEL DISPOSABLE) IMPLANT
TRAP SPECIMEN MUCOUS 40CC (MISCELLANEOUS) ×2 IMPLANT
TUBE CONNECTING 20X1/4 (TUBING) ×2 IMPLANT

## 2014-05-29 NOTE — Interval H&P Note (Signed)
History and Physical Interval Note:  05/29/2014 7:25 AM  Adam Warner  has presented today for surgery, with the diagnosis of  RIGHT HILAR ADENOPATHY  The various methods of treatment have been discussed with the patient and family. After consideration of risks, benefits and other options for treatment, the patient has consented to  Procedure(s): [place] (N/A) as a surgical intervention .  The patient's history has been reviewed, patient examined, no change in status, stable for surgery.  I have reviewed the patient's chart and labs.  Questions were answered to the patient's satisfaction.     [name] C

## 2014-05-29 NOTE — Anesthesia Postprocedure Evaluation (Signed)
  Anesthesia Post-op Note  Patient: Adam Warner  Procedure(s) Performed: Procedure(s): VIDEO BRONCHOSCOPY WITH ENDOBRONCHIAL ULTRASOUND (N/A)  Patient Location: PACU  Anesthesia Type:General  Level of Consciousness: awake, alert , oriented and patient cooperative  Airway and Oxygen Therapy: Patient Spontanous Breathing  Post-op Pain: none  Post-op Assessment: Post-op Vital signs reviewed, Patient's Cardiovascular Status Stable, Respiratory Function Stable, Patent Airway, No signs of Nausea or vomiting and Pain level controlled  Post-op Vital Signs: Reviewed and stable  Last Vitals:  Filed Vitals:   05/29/14 1020  BP: 113/69  Pulse: 76  Temp:   Resp: 18    Complications: No apparent anesthesia complications

## 2014-05-29 NOTE — Transfer of Care (Signed)
Immediate Anesthesia Transfer of Care Note  Patient: Adam Warner  Procedure(s) Performed: Procedure(s): VIDEO BRONCHOSCOPY WITH ENDOBRONCHIAL ULTRASOUND (N/A)  Patient Location: PACU  Anesthesia Type:General  Level of Consciousness: awake, alert , oriented and patient cooperative  Airway & Oxygen Therapy: Patient Spontanous Breathing and Patient connected to nasal cannula oxygen  Post-op Assessment: Report given to RN, Post -op Vital signs reviewed and stable and Patient moving all extremities  Post vital signs: Reviewed and stable  Last Vitals:  Filed Vitals:   05/29/14 0921  BP:   Pulse: 95  Temp:   Resp: 26    Complications: No apparent anesthesia complications

## 2014-05-29 NOTE — Brief Op Note (Signed)
05/29/2014  9:29 AM  PATIENT:  Adam Warner  59 y.o. male  PRE-OPERATIVE DIAGNOSIS:   RIGHT HILAR ADENOPATHY  POST-OPERATIVE DIAGNOSIS:  NON-SMALL CELL CARCINOMA  PROCEDURE:  Procedure(s): VIDEO BRONCHOSCOPY WITH ENDOBRONCHIAL ULTRASOUND (N/A)  SURGEON:  Surgeon(s) and Role:    * Melrose Nakayama, MD - Primary  ANESTHESIA:   general  EBL:  Total I/O In: 1100 [I.V.:1100] Out: 15 [Blood:15]  BLOOD ADMINISTERED:none  DRAINS: none   LOCAL MEDICATIONS USED:  NONE  SPECIMEN:  Source of Specimen:  Level 7. 4R, 10R lymph nodes, RUL nodule  DISPOSITION OF SPECIMEN:  PATHOLOGY  PLAN OF CARE: Discharge to home after PACU  PATIENT DISPOSITION:  PACU - hemodynamically stable.   Delay start of Pharmacological VTE agent (>24hrs) due to surgical blood loss or risk of bleeding: not applicable  Brushings + Non=small cell carcinoma

## 2014-05-29 NOTE — Anesthesia Preprocedure Evaluation (Addendum)
Anesthesia Evaluation  Patient identified by MRN, date of birth, ID band Patient awake    Reviewed: Allergy & Precautions, NPO status , Patient's Chart, lab work & pertinent test results, reviewed documented beta blocker date and time   History of Anesthesia Complications Negative for: history of anesthetic complications  Airway Mallampati: II  TM Distance: >3 FB Neck ROM: Full    Dental  (+) Lower Dentures, Upper Dentures, Dental Advisory Given, Edentulous Upper, Edentulous Lower   Pulmonary shortness of breath, COPD COPD inhaler, Current Smoker,  breath sounds clear to auscultation        Cardiovascular hypertension, Pt. on medications + Past MI and + Peripheral Vascular Disease Rhythm:Regular Rate:Normal  3/15 Stress test: normal   Neuro/Psych TIACVA, No Residual Symptoms    GI/Hepatic negative GI ROS, Neg liver ROS,   Endo/Other  diabetes (glu 108), Type 2, Oral Hypoglycemic Agents  Renal/GU negative Renal ROS     Musculoskeletal   Abdominal   Peds  Hematology negative hematology ROS (+)   Anesthesia Other Findings   Reproductive/Obstetrics                         Anesthesia Physical Anesthesia Plan  ASA: III  Anesthesia Plan: General   Post-op Pain Management:    Induction: Intravenous  Airway Management Planned: Oral ETT  Additional Equipment:   Intra-op Plan:   Post-operative Plan: Extubation in OR  Informed Consent: I have reviewed the patients History and Physical, chart, labs and discussed the procedure including the risks, benefits and alternatives for the proposed anesthesia with the patient or authorized representative who has indicated his/her understanding and acceptance.     Plan Discussed with: CRNA and Surgeon  Anesthesia Plan Comments: (Plan routine monitors, GETA)        Anesthesia Quick Evaluation

## 2014-05-29 NOTE — Discharge Instructions (Signed)
Do not drive or engage in heavy physical activity for 24 hours  You may cough up small amounts of blood over the next few days  You may use over-the-counter cough medication or pain relievers as needed  The biopsies showed non-small cell carcinoma, the most common type of lung cancer. We will do additional tests to determine the extent of disease and the best treatment options for you.  My office will contact you with follow up information

## 2014-05-29 NOTE — H&P (View-Only) (Signed)
HPI: Adam Warner returns for a scheduled follow-up visit   Expand All Collapse All     He is a 59-year-old gentleman with history of tobacco abuse, COPD, coronary artery disease, carotid artery disease, stroke, and diabetes.   He had a left VATS and lingulectomy by Dr. Burney in 2007 for what turned out to be inflammatory disease. He was followed for other lung nodules by CT scan. These did not change until January 2015 when his CT showed a new 1.4 x 1.8 cm spiculated mass in the lateral right upper lobe abutting the minor fissure. All of his prior nodules were stable. A PET CT was done, which showed the new mass was hypermetabolic. There also was a hypermetabolic hilar lymph node.   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 The plan would have been to do a right VATS, wedge resection, and then an anatomic resection if the frozen showed cancer. He refused at that time as he was dealing with his wife's stroke.   He returned to the office in June. A repeat CT of the chest showed that the nodule had actually decreased in size. We elected to continue to follow him radiographically.      I last saw him in September. The dominant nodule was unchanged. There was some increased nodularity medially. The hilar node was unchanged.  Since his last visit he says his been feeling well. He quit smoking in February 2015. He says he has not been smoking at all. He says his weight is stable. His appetite is good. He is not having any new bone or joint pain. He's not having any unusual headaches or visual changes. He still has a lot of social issues related to the care of his wife. He has not had any unusual cough or hemoptysis. He denies chest pain or shortness breath.  Past Medical History  Diagnosis Date  . COPD (chronic obstructive pulmonary disease)   . Stroke   . Hypertension   . Diabetes mellitus   . Coronary artery disease   . Heart attack 1991  . Pulmonary nodule        Current Outpatient Prescriptions  Medication Sig Dispense Refill  . ACCU-CHEK AVIVA PLUS test strip USE TO CHECK ONCE OR TWICE DAILY 100 each 0  . ACCU-CHEK FASTCLIX LANCETS MISC Check blood sugar bid and prn 102 each 0  . aspirin EC 325 MG tablet Take 325 mg by mouth every morning.     . atorvastatin (LIPITOR) 40 MG tablet TAKE ONE TABLET BY MOUTH ONE TIME DAILY 30 tablet 6  . Blood Glucose Monitoring Suppl (ONE TOUCH ULTRA 2) W/DEVICE KIT Use monitor to test blood sugar qd Dx 250.00 1 each 0  . Cholecalciferol (VITAMIN D) 2000 UNITS CAPS Take 1 capsule by mouth daily.    . metFORMIN (GLUCOPHAGE) 500 MG tablet Take 1 tablet (500 mg total) by mouth at bedtime. 30 tablet 6  . tiotropium (SPIRIVA) 18 MCG inhalation capsule Place 1 capsule (18 mcg total) into inhaler and inhale daily. 30 capsule 6  . valsartan (DIOVAN) 320 MG tablet TAKE ONE TABLET BY MOUTH ONE TIME DAILY 30 tablet 6   No current facility-administered medications for this visit.    Physical Exam BP 118/78 mmHg  Pulse 85  Resp 16  Ht 5' 5" (1.651 m)  Wt 116 lb (52.617 kg)  BMI 19.30 kg/m2  SpO2 98% 58-year-old man in no acute distress Thin Alert and oriented 3  No   cervical or supraclavicular adenopathy Cardiac regular rate and rhythm normal S1 and S2, no murmur Lungs distant breath sounds bilaterally, no wheezing Abdomen soft nontender No cyanosis or peripheral edema. Positive early clubbing  Diagnostic Tests: CT CHEST WITHOUT CONTRAST  TECHNIQUE: Multidetector CT imaging of the chest was performed following the standard protocol without IV contrast.  COMPARISON: 12/27/2013  FINDINGS: Mediastinum: The heart size is normal. There is no pericardial effusion. Calcified atherosclerotic disease involves the thoracic aorta as well as the RCA, LAD coronary arteries. The trachea appears patent and is midline. Normal appearance of the esophagus. Right hilar lymph node/mass has increased in size from  previous exam now measuring 2.3 cm, image 28/ series 3. Previously 1.5 cm.  Lungs/Pleura: No pleural effusion identified. There are postoperative changes in volume loss within the left lung compatible with previous wedge resection surgery. No specific features identified to suggest residual or recurrence of tumor within the left lung. Subpleural spiculated opacity within the periphery of the right upper lobe measures 1.2 cm x 0.8, image 27/ series 4. Previously 1.2 x 0.7 cm. Linear opacity within the right upper lobe measures 8 mm, image 22/ series 4. Stable from previous exam. Again noted are mild to moderate changes of centrilobular and paraseptal emphysema. Peripheral and basilar predominant subpleural reticulation identified. Diffuse bronchial wall thickening identified.  Upper Abdomen: Incidental imaging within the upper abdomen is on unremarkable. The visualized portions of the liver, spleen, and adrenal glands are normal. The pancreas is unremarkable.  Musculoskeletal: Review of the visualized osseous structures is on unremarkable. No aggressive lytic or sclerotic bone lesions identified.  IMPRESSION: 1. Right hilar lymph node/perihilar mass has increased in size from previous exam. This is concerning for progressive tumor. Recommend further evaluation with PET-CT. 2. Peripheral nodular opacities within the right lung are not significantly changed from previous exam. 3. Diffuse bronchial wall thickening with emphysema, as above; imaging findings suggestive of underlying COPD. 4. Atherosclerotic disease including multi vessel coronary artery calcifications.   Electronically Signed  By: Taylor Stroud M.D.  On: 05/09/2014 09:27   Impression: 58-year-old man with a long history of tobacco abuse who has been followed for multiple pulmonary nodules/hilar adenopathy. He had a lung resection in 2007 by Dr. Burney for suspected cancer that turned out to be  inflammatory disease. I have now been following him for about a year for lung nodules and hilar adenopathy. I had originally recommended surgery, but he refused at that time due to his wife's illness. Then on subsequent follow-up CTs there was a decrease in the size of the dominant nodule so we continued to follow him radiologically after that.  I personally reviewed the CT and there is some progression of his right hilar adenopathy. I reviewed the CT scan with him and compared to his previous scan. There really has not been any progression of his dominant nodule or any of the other nodules for that matter. However the increase in size of the hilar node is concerning. This could represent a stage II cancer. It also could be an inflammatory or infectious etiology.  I recommended to him that we start by biopsying the hilar node. I recommended that we do bronchoscopy and endobronchial ultrasound as I think this will be the safest way to access the node. I described the procedure to him in detail. He understands this would be done in the operating room under general anesthesia. The procedure would be done through the endotracheal tube and would not require incision. He understands   the risks of the procedure include those of general anesthesia. He understands the procedure specific risks include bleeding, pneumothorax, nondiagnostic or false negative sampling. He accepts the risks and wishes to proceed, but wishes to wait a couple of weeks so that he can make arrangements for his wife's care.  Additional testing such as a PET CT or MR of the brain may be necessary depending on the findings at the time of bronchoscopy/EBUS  Plan:  Bronchoscopy and endobronchial ultrasound on Monday, 05/29/2014  Marlicia Sroka C Kimberley Dastrup M.D.   

## 2014-05-29 NOTE — Progress Notes (Signed)
   Right endobronchial , EBUS with video bronch

## 2014-05-30 ENCOUNTER — Encounter (HOSPITAL_COMMUNITY): Payer: Self-pay | Admitting: Thoracic Surgery (Cardiothoracic Vascular Surgery)

## 2014-05-30 LAB — GLUCOSE, CAPILLARY: GLUCOSE-CAPILLARY: 99 mg/dL (ref 70–99)

## 2014-05-30 NOTE — Op Note (Signed)
NAMEYAHSIR, WICKENS NO.:  [id number]  MEDICAL RECORD NO.:  [id number]  LOCATION:  [facility]                         FACILITY:  [facility]  PHYSICIAN:  Revonda Standard. Roxan Hockey, M.D.DATE OF BIRTH:  1956/04/16  DATE OF PROCEDURE:  05/29/2014 DATE OF DISCHARGE:  05/29/2014                              OPERATIVE REPORT   PREOPERATIVE DIAGNOSIS:  Right upper lobe mass with hilar adenopathy.  POSTOPERATIVE DIAGNOSIS:  Right upper lobe mass with hilar adenopathy.  PROCEDURE:  Video bronchoscopy with brushings and biopsies and endobronchial ultrasound with mediastinal node aspiration.  SURGEON:  Revonda Standard. Roxan Hockey, M.D.  ASSISTANT:  None.  ANESTHESIA:  General.  FINDINGS:  Normal to slightly enlarged 4R and 7 lymph nodes.  Grossly enlarged 10R lymph node.  Extrinsic compression of left posterior segmental bronchus of the right upper lobe.  Brushings showed non-small cell carcinoma.  CLINICAL NOTE:  Mr. Hynes is a 59 year old gentleman with a history of tobacco abuse and COPD.  He has a history of a left VATS and lingulectomy in 2007 for inflammatory disease.  He developed a new nodule in January, 2015.  The mass was hypermetabolic and there was a hypermetabolic hilar lymph node.  Surgery was recommended at the time, but he refused due to dealing with wife's medical problems.  On followup CT, the lesion had decreased in size. On his most recent CT, however, the lung nodule was stable but his hilar lymph node had increased in size. He was advised to undergo bronchoscopy and endobronchial ultrasound for diagnostic and staging purposes. The indications, risks, benefits, and alternatives were discussed in detail with the patient.  He understood and accepted the risks and agreed to proceed.  OPERATIVE NOTE:  Mr. Bielinski was brought to the operating room on May 29, 2014.  He had induction of general anesthesia and was intubated. Flexible fiberoptic bronchoscopy was  performed via the endotracheal tube.  The trachea and left bronchial tree were within normal limits to the level of the subsegmental bronchi. On the right side, there was extrinsic compression of the posterior segmental bronchus of the right upper lobe.  The lower and middle lobe bronchi were within normal limits to the level of the subsegmental bronchi.  The bronchoscope was withdrawn.  The endobronchial ultrasound probe then was advanced.  Inspection revealed normal to slightly enlarged subcarinal level 7 and 4R paratracheal nodes. The level 10R node was grossly enlarged.  Aspirations were performed of the 4R and 7 nodes. A portion of each aspiration was placed on slides for immediate examination.  The remainder was placed in Cyto-Lyte for permanent pathology.  With each aspiration, the needle was advanced into the lymph node with ultrasound visualization.  Ten or more passes within the node were performed with suction applied.  Two aspirations were performed of each of the lymph nodes.  The level 7 node was aspirated initially. The 4R node which was very difficult to aspirate as it was hard to get the end of the needle through the trachea due to angulation. Finally the 10R node was sampled.  The ultrasound probe was removed.  Next, the bronchoscope was reinserted and brushings were performed of the  posterior segmental bronchus of the right upper lobe.  These were applied to a slide and brushings and lymph node aspirations were sent to pathology for evaluation.  Next, biopsies were performed and the posterior segmental bronchus was partially reopened.  There was mild bleeding that cleared with saline irrigation. Pathology called with results of the initial aspirations.  The level 7 specimen was deemed inadequate. There were some atypical cells in the 4R and 10R nodes but no definite malignancy.  The brushings, however, were positive for non- small cell carcinoma.  The patient was extubated in  the operating room and taken to the postanesthetic care unit in good condition.     Revonda Standard Roxan Hockey, M.D.     SCH/MEDQ  D:  05/29/2014  T:  05/30/2014  Job:  [id number]

## 2014-05-31 ENCOUNTER — Ambulatory Visit
Admission: RE | Admit: 2014-05-31 | Discharge: 2014-05-31 | Disposition: A | Discharge status: Home / Self Care | Payer: Medicaid Other | Source: Ambulatory Visit | Attending: Thoracic Surgery (Cardiothoracic Vascular Surgery) | Admitting: Thoracic Surgery (Cardiothoracic Vascular Surgery)

## 2014-05-31 DIAGNOSIS — R918 Other nonspecific abnormal finding of lung field: Secondary | ICD-10-CM

## 2014-05-31 MED ORDER — GADOBENATE DIMEGLUMINE 529 MG/ML IV SOLN
10.0000 mL | Freq: Once | INTRAVENOUS | Status: AC | PRN
  Administered 2014-05-31: 10 mL via INTRAVENOUS

## 2014-06-05 ENCOUNTER — Ambulatory Visit (HOSPITAL_COMMUNITY): Payer: Medicaid Other

## 2014-06-05 ENCOUNTER — Encounter (HOSPITAL_COMMUNITY): Payer: Medicaid Other

## 2014-06-06 ENCOUNTER — Ambulatory Visit: Payer: Medicaid Other | Admitting: Thoracic Surgery (Cardiothoracic Vascular Surgery)

## 2014-06-07 ENCOUNTER — Other Ambulatory Visit: Payer: Self-pay | Admitting: Family Medicine

## 2014-06-08 ENCOUNTER — Encounter (HOSPITAL_COMMUNITY)
Admission: RE | Admit: 2014-06-08 | Discharge: 2014-06-08 | Disposition: A | Discharge status: Home / Self Care | Payer: Medicaid Other | Source: Ambulatory Visit | Attending: Thoracic Surgery (Cardiothoracic Vascular Surgery) | Admitting: Thoracic Surgery (Cardiothoracic Vascular Surgery)

## 2014-06-08 ENCOUNTER — Ambulatory Visit (HOSPITAL_COMMUNITY): Payer: Medicaid Other

## 2014-06-08 DIAGNOSIS — C3411 Malignant neoplasm of upper lobe, right bronchus or lung: Secondary | ICD-10-CM | POA: Insufficient documentation

## 2014-06-08 DIAGNOSIS — Z720 Tobacco use: Secondary | ICD-10-CM | POA: Diagnosis not present

## 2014-06-08 DIAGNOSIS — R918 Other nonspecific abnormal finding of lung field: Secondary | ICD-10-CM

## 2014-06-08 LAB — GLUCOSE, CAPILLARY: GLUCOSE-CAPILLARY: 147 mg/dL — AB (ref 70–99)

## 2014-06-08 MED ORDER — FLUDEOXYGLUCOSE F - 18 (FDG) INJECTION
6.2000 | Freq: Once | INTRAVENOUS | Status: AC | PRN
  Administered 2014-06-08: 6.2 via INTRAVENOUS

## 2014-06-13 ENCOUNTER — Telehealth: Payer: Self-pay | Admitting: *Deleted

## 2014-06-13 ENCOUNTER — Other Ambulatory Visit: Payer: Self-pay | Admitting: *Deleted

## 2014-06-13 ENCOUNTER — Encounter: Payer: Self-pay | Admitting: Thoracic Surgery (Cardiothoracic Vascular Surgery)

## 2014-06-13 ENCOUNTER — Ambulatory Visit (INDEPENDENT_AMBULATORY_CARE_PROVIDER_SITE_OTHER): Payer: Medicaid Other | Admitting: Thoracic Surgery (Cardiothoracic Vascular Surgery)

## 2014-06-13 VITALS — BP 146/87 | HR 96 | Resp 20 | Ht 65.0 in | Wt 124.0 lb

## 2014-06-13 DIAGNOSIS — Z9889 Other specified postprocedural states: Secondary | ICD-10-CM

## 2014-06-13 DIAGNOSIS — Z801 Family history of malignant neoplasm of trachea, bronchus and lung: Secondary | ICD-10-CM

## 2014-06-13 DIAGNOSIS — R911 Solitary pulmonary nodule: Secondary | ICD-10-CM

## 2014-06-13 DIAGNOSIS — C3411 Malignant neoplasm of upper lobe, right bronchus or lung: Secondary | ICD-10-CM

## 2014-06-13 NOTE — Telephone Encounter (Signed)
Called to schedule appt for thoracic clinic on 06/22/14.

## 2014-06-13 NOTE — Progress Notes (Signed)
   Adam Warner returns today to discuss the results of his bronchoscopy, EBUS, MR brain and PET/CT.  He is a 59 yo man with a history of tobacco abuse who has a right upper lobe mass with hilar adenopathy. On biopsy this was a poorly differentiated carcinoma. His MR showed no brain mets. His PET was consistent with clinical stage IIA disease with a large right hilar lymph node.   I reviewed the PET CT with Mr. Mcneese and his daughter. It shows a hypermetabolic right upper lobe nodule with bulky hilar adenopathy. There is no hypermetabolic mediastinal adenopathy. Based on the appearance and location of the hilar node, he would require a pneumonectomy for complete resection.  I do not think he is a good candidate for a pneumonectomy, and would like to avoid that whenever possible. Given his biopsy-proven positive nodal status he will require chemotherapy in any event. My preference would be to give him neoadjuvant chemotherapy and radiation up to about 45 Gy preoperatively. If he has a good response, I think a right upper lobectomy would be possible and it would also be far preferable to a pneumonectomy.  I am going to refer him to oncology and radiation oncology for consultation to get their opinion on the matter.  We will await their assessment before making a final plan.  I spent over 15 minutes with Mr. Slawinski in consultation and discussion.  Revonda Standard Roxan Hockey, MD [facility] [phone]

## 2014-06-20 ENCOUNTER — Telehealth: Payer: Self-pay | Admitting: *Deleted

## 2014-06-20 NOTE — Telephone Encounter (Signed)
Received request from Hinton Dyer to call pt and remind him of his appt.  Called and confirmed 3/3 appt w/ pt, gave directions and instructions.

## 2014-06-22 ENCOUNTER — Encounter: Payer: Self-pay | Admitting: Internal Medicine

## 2014-06-22 ENCOUNTER — Other Ambulatory Visit (HOSPITAL_BASED_OUTPATIENT_CLINIC_OR_DEPARTMENT_OTHER): Payer: Medicaid Other

## 2014-06-22 ENCOUNTER — Ambulatory Visit: Payer: Medicaid Other | Attending: Internal Medicine | Admitting: Physical Therapy

## 2014-06-22 ENCOUNTER — Ambulatory Visit (HOSPITAL_BASED_OUTPATIENT_CLINIC_OR_DEPARTMENT_OTHER): Payer: Medicaid Other | Admitting: Internal Medicine

## 2014-06-22 ENCOUNTER — Encounter: Payer: Self-pay | Admitting: *Deleted

## 2014-06-22 ENCOUNTER — Ambulatory Visit
Admission: RE | Admit: 2014-06-22 | Discharge: 2014-06-22 | Disposition: A | Discharge status: Home / Self Care | Payer: Medicaid Other | Source: Ambulatory Visit | Attending: Radiation Oncology | Admitting: Radiation Oncology

## 2014-06-22 VITALS — BP 147/83 | HR 89 | Temp 98.4°F | Resp 19 | Wt 121.7 lb

## 2014-06-22 VITALS — BP 147/83 | HR 89 | Temp 98.4°F | Resp 19 | Ht 65.0 in | Wt 121.7 lb

## 2014-06-22 DIAGNOSIS — R911 Solitary pulmonary nodule: Secondary | ICD-10-CM

## 2014-06-22 DIAGNOSIS — C3411 Malignant neoplasm of upper lobe, right bronchus or lung: Secondary | ICD-10-CM

## 2014-06-22 DIAGNOSIS — C3491 Malignant neoplasm of unspecified part of right bronchus or lung: Secondary | ICD-10-CM | POA: Diagnosis not present

## 2014-06-22 DIAGNOSIS — Z803 Family history of malignant neoplasm of breast: Secondary | ICD-10-CM

## 2014-06-22 DIAGNOSIS — R5381 Other malaise: Secondary | ICD-10-CM | POA: Insufficient documentation

## 2014-06-22 DIAGNOSIS — R2689 Other abnormalities of gait and mobility: Secondary | ICD-10-CM | POA: Insufficient documentation

## 2014-06-22 DIAGNOSIS — Z720 Tobacco use: Secondary | ICD-10-CM

## 2014-06-22 LAB — CBC WITH DIFFERENTIAL/PLATELET
BASO%: 0.5 % (ref 0.0–2.0)
Basophils Absolute: 0 10*3/uL (ref 0.0–0.1)
EOS%: 1.2 % (ref 0.0–7.0)
Eosinophils Absolute: 0.1 10*3/uL (ref 0.0–0.5)
HEMATOCRIT: 39 % (ref 38.4–49.9)
HEMOGLOBIN: 13.4 g/dL (ref 13.0–17.1)
LYMPH%: 27 % (ref 14.0–49.0)
MCH: 31 pg (ref 27.2–33.4)
MCHC: 34.4 g/dL (ref 32.0–36.0)
MCV: 90.3 fL (ref 79.3–98.0)
MONO#: 0.3 10*3/uL (ref 0.1–0.9)
MONO%: 5.5 % (ref 0.0–14.0)
NEUT#: 4 10*3/uL (ref 1.5–6.5)
NEUT%: 65.8 % (ref 39.0–75.0)
Platelets: 302 10*3/uL (ref 140–400)
RBC: 4.32 10*6/uL (ref 4.20–5.82)
RDW: 13.1 % (ref 11.0–14.6)
WBC: 6 10*3/uL (ref 4.0–10.3)
lymph#: 1.6 10*3/uL (ref 0.9–3.3)

## 2014-06-22 LAB — COMPREHENSIVE METABOLIC PANEL (CC13)
ALBUMIN: 3.7 g/dL (ref 3.5–5.0)
ALT: 16 U/L (ref 0–55)
AST: 19 U/L (ref 5–34)
Alkaline Phosphatase: 119 U/L (ref 40–150)
Anion Gap: 11 mEq/L (ref 3–11)
BILIRUBIN TOTAL: 0.64 mg/dL (ref 0.20–1.20)
BUN: 11.4 mg/dL (ref 7.0–26.0)
CO2: 23 mEq/L (ref 22–29)
Calcium: 9.9 mg/dL (ref 8.4–10.4)
Chloride: 102 mEq/L (ref 98–109)
Creatinine: 1 mg/dL (ref 0.7–1.3)
EGFR: 81 mL/min/{1.73_m2} — ABNORMAL LOW (ref >90)
Glucose: 105 mg/dl (ref 70–140)
Potassium: 4.4 mEq/L (ref 3.5–5.1)
SODIUM: 135 meq/L — AB (ref 136–145)
TOTAL PROTEIN: 7.6 g/dL (ref 6.4–8.3)

## 2014-06-22 NOTE — [facility] Oncology Navigator Note (Unsigned)
   Thoracic Treatment Summary Name:Joby C Ptacek Date:06/22/2014 DOB:08/22/1955 Your Medical Team Medical Oncologist:Dr. Mohamed Radiation Oncologist:Dr. Kinard  Thoracic Surgery: Dr. Roxan Hockey  Type and Stage of Lung Cancer Non-Small Cell Carcinoma:   Clinical Stage:  Non-small cell carcinoma of lung, stage 2   Staging form: Lung, AJCC 7th Edition     Clinical stage from 06/22/2014: Stage IIA (T1a, N1, M0) - Signed by Curt Bears, MD on 06/22/2014    Clinical stage is based on radiology exams.  Pathological stage will be determined after surgery.  Staging is based on the size of the tumor, involvement of lymph nodes or not, and whether or not the cancer center has spread. Recommendations Recommendations: Concurrent chemoradiation therapy  These recommendations are based on information available as of today's consult.  This is subject to change depending further testing or exams. Next Steps Next Step: Medical Oncology will set up follow up appointments  Radiation Oncology will be set up in [facility] Barriers to Care What do you perceive as a potential barrier that may prevent you from receiving your treatment plan? Education-information on lung cancer and resources given and explained  Support information given and explained  Estate manager/land agent will contact patient if needed  Resources Given: NCI Booklet on [employer] at [employer].Radonna Ricker [phone]    Questions Norton Blizzard, RN BSN Thoracic Oncology Nurse Navigator at [facility] is a nurse navigator that is available to assist you through your cancer journey.  She can answer your questions and/or provide resources regarding your treatment plan, emotional support, or financial concerns.

## 2014-06-22 NOTE — Progress Notes (Signed)
[facility] [phone]   [phone] Multidisciplinary thoracic oncology clinic  CONSULT NOTE  REFERRING PHYSICIAN: Dr. Charlett Lango  REASON FOR CONSULTATION:  59 years old white male recently diagnosed with lung cancer.  HPI Adam Warner is a 59 y.o. male with past medical history significant for diabetes mellitus, dyslipidemia, hypertension, coronary artery disease, carotid artery stenosis, TIA and stroke as well as history of COPD, syncopal episodes and long history of smoking. In March 2007 the patient had left lingulectomy performed by Dr. Edwyna Shell and the final pathology was consistent with focal inflammation associated with fibrosis and abscess but no evidence of malignancy. He was followed closely. CT scan of the chest without contrast on 04/29/2013 showed new 1.8 x 1.1 cm spiculated subpleural opacity in the lateral right upper lobe suspicious for primary bronchogenic neoplasm. He was seen by Dr. Molly Maduro and a PET scan performed on 05/13/2013 showed hypermetabolic right upper lobe pulmonary nodule abutting the pleural surface most consistent with primary bronchogenic carcinoma. There was a small focus of hypermetabolic metabolic activity in the right hilum suspicious for metastatic lymph node and 2 additional small right upper lobe pulmonary nodules with no significant metabolic activity. The patient was seen by Dr. Dorris Fetch but it looks like he was lost to follow-up and repeat CT scan of the chest on 09/19/2013 showed slight interval decrease in the size of the patient is known hypermetabolic nodule abutting the pleural service over the lateral right upper lobe measuring 1.1 x 1.4 cm. Several small nodules over the right upper lobe and right middle lobe and lingula were stable and the 1.3 cm right hilar lymph node was difficult to accurately assess for interval change due to lack of contrast. He was followed closely and repeat CT scan of the chest  on 05/09/2014 showed right hilar lymph node/perihilar mass had increased in size from the previous exam concerning for progressive tumor. The peripheral nodular opacity within the right lung did not significantly changed from the previous exam. Further staging workup including brain MRI on 06/01/2014 showed no evidence for metastatic disease to the brain. A PET scan on 06/08/2014 showed persistent hypermetabolic nodule within the right upper lobe concerning for primary bronchogenic carcinoma. There was enlarged and hypermetabolic right hilar lymph node and no evidence for distant metastatic disease.  On 05/29/2014 the patient underwent bronchoscopy with brushings and biopsies and endobronchial ultrasound with mediastinal lymph node aspiration under the care of Dr. Dorris Fetch. The final pathology (Accession: [id number]) showed invasive poorly differentiated carcinoma. The biopsies demonstrate poorly differentiated carcinoma with the following immunophenotype: TTF1 - negative expression, Cytokeratin 5/6 - negative expression. Cytokeratin AE1/AE3 - strong diffuse expression, Cytokeratin 7 - strong diffuse expression. Napsin-A - negative expression, p63 - negative expression, Melan-A - negative expression, S100 - negative expression. Overall the morphology and immunophenotype are that of poorly differentiated invasive carcinoma, not otherwise specified. Dr. Dorris Fetch kindly referred the patient to me today for evaluation and recommendation regarding treatment of his condition. When seen today the patient denied having any specific complaints. He denied having any chest pain, shortness breath, cough or hemoptysis. He has no significant weight loss or night sweats. He has no fever or chills, no nausea or vomiting. Family history significant for mother with breast cancer in her 16s and father had heart disease. He also has an Engineer, mining with lung cancer. The patient is married and has no children. He was accompanied by  his niece Herbert Seta. She is the caregiver for his  wife with stroke. He used to work in tree cutting. He has a history of smoking 2 packs per day for around 40 years and he is currently down to 1 pack per day. He also drinks 1-2 by mouth every day and no history of drug abuse.   HPI  Past Medical History  Diagnosis Date  . COPD (chronic obstructive pulmonary disease)   . Stroke   . Hypertension   . Heart attack 1991  . Pulmonary nodule   . Coronary artery disease     sees Dr Spero Curb at [facility] every 2-3 years  . Shortness of breath dyspnea   . Diabetes mellitus     Type Niddm x 2 years  . Full dentures     Past Surgical History  Procedure Laterality Date  . Lung surgery      Dr Arlyce Dice  . Angioplasty    . Colonoscopy  10/22/2011    Procedure: COLONOSCOPY;  Surgeon: Rogene Houston, MD;  Location: [facility];  Service: Endoscopy;  Laterality: N/A;  1200  . Carotid endarterectomy Right   . Video bronchoscopy with endobronchial ultrasound N/A 05/29/2014    Procedure: VIDEO BRONCHOSCOPY WITH ENDOBRONCHIAL ULTRASOUND;  Surgeon: Melrose Nakayama, MD;  Location: [facility] OR;  Service: Thoracic;  Laterality: N/A;    Family History  Problem Relation Age of Onset  . Heart disease Father   . Heart disease Sister   . Heart attack Father 65    Social History History  Substance Use Topics  . Smoking status: Current Some Day Smoker -- 2.00 packs/day for 40 years    Types: Cigarettes  . Smokeless tobacco: Never Used     Comment: smokes 1-2 cigarettes a day  . Alcohol Use: 3.6 oz/week    6 Cans of beer per week     Comment: 6 pack beer per wk    Allergies  Allergen Reactions  . Zyban [Bupropion Hcl] Other (See Comments)    Seeing black spots    Current Outpatient Prescriptions  Medication Sig Dispense Refill  . ACCU-CHEK AVIVA PLUS test strip USE TO CHECK ONCE OR TWICE DAILY 100 each 0  . ACCU-CHEK FASTCLIX LANCETS MISC Check blood sugar bid and prn 102 each 0  . aspirin EC 325  MG tablet Take 325 mg by mouth every morning. Hold for surgery    . atorvastatin (LIPITOR) 40 MG tablet TAKE ONE TABLET BY MOUTH ONE TIME DAILY 30 tablet 6  . Blood Glucose Monitoring Suppl (ONE TOUCH ULTRA 2) W/DEVICE KIT Use monitor to test blood sugar qd Dx 250.00 1 each 0  . Cholecalciferol (VITAMIN D) 2000 UNITS CAPS Take 1 capsule by mouth daily.    . metFORMIN (GLUCOPHAGE) 500 MG tablet Take 1 tablet (500 mg total) by mouth at bedtime. 30 tablet 6  . tiotropium (SPIRIVA) 18 MCG inhalation capsule Place 1 capsule (18 mcg total) into inhaler and inhale daily. 30 capsule 6  . valsartan (DIOVAN) 320 MG tablet TAKE ONE TABLET BY MOUTH ONE TIME DAILY 30 tablet 6   No current facility-administered medications for this visit.    Review of Systems  Constitutional: negative Eyes: negative Ears, nose, mouth, throat, and face: negative Respiratory: negative Cardiovascular: negative Gastrointestinal: negative Genitourinary:negative Integument/breast: negative Hematologic/lymphatic: negative Musculoskeletal:negative Neurological: negative Behavioral/Psych: negative Endocrine: negative Allergic/Immunologic: negative  Physical Exam  LKJ:ZPHXT, healthy, no distress and malnourished SKIN: skin color, texture, turgor are normal HEAD: Normocephalic, No masses, lesions, tenderness or abnormalities EYES: normal, PERRLA EARS: External ears  normal, Canals clear OROPHARYNX:no exudate, no erythema and lips, buccal mucosa, and tongue normal  NECK: supple, no adenopathy, no JVD LYMPH:  no palpable lymphadenopathy, no hepatosplenomegaly LUNGS: expiratory wheezes bilaterally, scattered rhonchi bilaterally HEART: regular rate & rhythm, no murmurs and no gallops ABDOMEN:abdomen soft, non-tender, normal bowel sounds and no masses or organomegaly BACK: Back symmetric, no curvature., No CVA tenderness EXTREMITIES:no joint deformities, effusion, or inflammation, no edema, no skin discoloration  NEURO:  alert & oriented x 3 with fluent speech, no focal motor/sensory deficits  PERFORMANCE STATUS: ECOG 1  LABORATORY DATA: Lab Results  Component Value Date   WBC 6.0 06/22/2014   HGB 13.4 06/22/2014   HCT 39.0 06/22/2014   MCV 90.3 06/22/2014   PLT 302 06/22/2014      Chemistry      Component Value Date/Time   NA 135* 06/22/2014 1519   NA 136 05/23/2014 1015   NA 139 04/12/2014 1029   K 4.4 06/22/2014 1519   K 4.1 05/23/2014 1015   CL 106 05/23/2014 1015   CO2 23 06/22/2014 1519   CO2 25 05/23/2014 1015   BUN 11.4 06/22/2014 1519   BUN 10 05/23/2014 1015   BUN 12 04/12/2014 1029   CREATININE 1.0 06/22/2014 1519   CREATININE 1.07 05/23/2014 1015   CREATININE 1.05 11/04/2012 1242      Component Value Date/Time   CALCIUM 9.9 06/22/2014 1519   CALCIUM 9.3 05/23/2014 1015   ALKPHOS 119 06/22/2014 1519   ALKPHOS 90 05/23/2014 1015   AST 19 06/22/2014 1519   AST 19 05/23/2014 1015   ALT 16 06/22/2014 1519   ALT 17 05/23/2014 1015   BILITOT 0.64 06/22/2014 1519   BILITOT 0.6 05/23/2014 1015       RADIOGRAPHIC STUDIES: Dg Chest 2 View  05/29/2014   CLINICAL DATA:  Preoperative chest radiograph for lung biopsy. Cough. Initial encounter.  EXAM: CHEST  2 VIEW  COMPARISON:  Chest radiograph performed 01/07/2012, and CT of the chest performed 05/09/2014  FINDINGS: The lungs are well-aerated. Chronically increased interstitial markings are noted. Postoperative change is noted at the left mid lung and left lung apex. Mild chronic nodularity is noted in the periphery of the lungs. There is no evidence of pleural effusion or pneumothorax.  The heart is normal in size; the known right hilar mass is partially characterized on the lateral view. No acute osseous abnormalities are seen.  IMPRESSION: 1. No acute cardiopulmonary process seen. Known right hilar mass is partially characterized on the lateral view. 2. Chronically increased interstitial markings and chronic nodularity in the  periphery of the lungs.   Electronically Signed   By: Garald Balding M.D.   On: 05/29/2014 06:27   Mr Jeri Cos HQ Contrast  06/01/2014   CLINICAL DATA:  Lung cancer diagnosed last month. No current therapy. Evaluation for metastatic disease.  EXAM: MRI HEAD WITHOUT AND WITH CONTRAST  TECHNIQUE: Multiplanar, multiecho pulse sequences of the brain and surrounding structures were obtained without and with intravenous contrast.  CONTRAST:  45mL MULTIHANCE GADOBENATE DIMEGLUMINE 529 MG/ML IV SOLN  COMPARISON:  06/13/2013  FINDINGS: There is no evidence of acute infarct, intracranial hemorrhage, mass, midline shift, or extra-axial fluid collection. Ventricles and sulci are within normal limits for age. Small foci of T2 hyperintensity throughout the subcortical and deep cerebral white matter and pons are unchanged and nonspecific but compatible with mild chronic small vessel ischemic disease. A chronic infarct is again seen in the right basal ganglia. There is no  abnormal enhancement.  Orbits are unremarkable. No inflammatory changes are identified in the paranasal sinuses or mastoid air cells. Major intracranial vascular flow voids are preserved.  IMPRESSION: 1. No evidence of intracranial metastases. 2. Mild chronic small vessel ischemic disease and chronic right basal ganglia infarct.   Electronically Signed   By: Logan Bores   On: 06/01/2014 09:02   Nm Pet Image Initial (pi) Skull Base To Thigh  06/08/2014   CLINICAL DATA:  Initial treatment strategy for Lung cancer.  EXAM: NUCLEAR MEDICINE PET SKULL BASE TO THIGH  TECHNIQUE: 6.2 mCi F-18 FDG was injected intravenously. Full-ring PET imaging was performed from the skull base to thigh after the radiotracer. CT data was obtained and used for attenuation correction and anatomic localization.  FASTING BLOOD GLUCOSE:  Value: 147 mg/dl  COMPARISON:  05/13/2013  FINDINGS: NECK  No hypermetabolic lymph nodes in the neck.  CHEST  Within the right upper lobe there is a  hypermetabolic spiculated nodule measuring 9 mm. This has an SUV max within the malignant range at 2.7. On the exam from 05/13/2013 this lesion measured 1.7 and had an SUV max equal to 5.1. Anterior right upper lobe nodule is stable measuring 6 mm, image 24/series 6. The 5 mm right apical nodule is stable, image 17/series 6.  Enlarged right hilar lymph node measures 3.2 cm and has an SUV max equal to 10.8. Previously this measured 1.3 cm and had an SUV max equal to 4.0. No hypermetabolic mediastinal or contralateral hilar lymph nodes.  There is atherosclerotic disease involving the thoracic aorta as well as the LAD and RCA Coronary arteries.  ABDOMEN/PELVIS  No abnormal hypermetabolic activity within the liver, pancreas, adrenal glands, or spleen. No hypermetabolic lymph nodes in the abdomen or pelvis. Small stones are identified within the right renal collecting system. There is calcified atherosclerotic disease involving the abdominal aorta. No aneurysm.  SKELETON  No focal hypermetabolic activity to suggest skeletal metastasis.  IMPRESSION: 1. Persistent, hypermetabolic nodule within the right upper lobe is identified and is concerning for primary bronchogenic carcinoma. 2. Enlarged and hypermetabolic right hilar lymph node. When compared with previous exam the size of this lymph node and the degree of FDG uptake has increased in the interval and is highly worrisome for metastatic adenopathy. 3. No evidence for distant metastatic disease. 4. Smaller nodules within the right upper lobe are unchanged from 05/13/2013 in remain 2 smaller reliably characterize by PET-CT.   Electronically Signed   By: Kerby Moors M.D.   On: 06/08/2014 10:02    ASSESSMENT: This is a very pleasant 59 years old white male recently diagnosed with unresectable stage IIa (T1a, N1, M0) non-small cell lung cancer, poorly differentiated diagnosed in February 2016 presented with right upper lobe lung nodule in addition to a right hilar  lymphadenopathy.    PLAN: I had a lengthy discussion with the patient and his niece today about his current disease stage, prognosis and treatment option. The patient is not currently a good surgical candidate for resection. I recommended for him a course of neoadjuvant concurrent chemoradiation with weekly carboplatin for AUC of 2 and paclitaxel 45 MG/M2. Followed by restaging scan and reevaluation for surgical resection. I discussed with the patient adverse effect of the chemotherapy including but not limited to alopecia, myelosuppression, nausea and vomiting, peripheral neuropathy, liver or renal dysfunction. I will arrange for the patient to have a chemotherapy education class before starting the first dose of his chemotherapy. He is expected to start  the first dose of his chemotherapy on 07/03/2014. The patient was seen later today by Dr. Sondra Come noted for consideration and discussion of the radiotherapy option. The patient lives in [place], [place] and he preferred to receive his radiotherapy close to home in [place]. Dr. Sondra Come will arrange for his radiotherapy to be done in [place]. The patient is still prefers to receive his chemotherapy in [place]. He would come back for follow-up visit in 3 weeks for reevaluation and management of any adverse effect of his chemotherapy. I will call his pharmacy with prescription for Compazine 10 mg by mouth every 6 hours as needed for nausea The patient was seen at the multidisciplinary thoracic oncology clinic today by medical oncology, radiation oncology, thoracic navigator, social worker and physical therapist.  The patient voices understanding of current disease status and treatment options and is in agreement with the current care plan.  All questions were answered. The patient knows to call the clinic with any problems, questions or concerns. We can certainly see the patient much sooner if necessary.  Thank you so much for allowing me to  participate in the care of Adam Warner. I will continue to follow up the patient with you and assist in his care.  I spent 55 minutes counseling the patient face to face. The total time spent in the appointment was 80 minutes.  Disclaimer: This note was dictated with voice recognition software. Similar sounding words can inadvertently be transcribed and may not be corrected upon review.   Elyjah Hazan K. June 22, 2014, 4:51 PM

## 2014-06-22 NOTE — Progress Notes (Signed)
Radiation Oncology         [phone] ________________________________  Initial outpatient Consultation  Name: Adam Warner MRN: [id number]  Date: 06/22/2014  DOB: Jan 24, 1956  [id number], Elenore Rota, MD  Melrose Nakayama, *   REFERRING PHYSICIAN: Melrose Nakayama, *  DIAGNOSIS: Stage IIa (T1, N1, M0) poorly differentiated non-small cell lung cancer presenting in the right lung  HISTORY OF PRESENT ILLNESS::Adam Warner is a 59 y.o. male who is seen out of the courtesy of Dr. Roxan Hockey as part of the multidisciplinary thoracic oncology clinic.  the patient has a prior left VATS and lingulectomy by Dr. Arlyce Dice and 2007 which turned out to be benign disease. Patient has since followed up with thoracic surgery. CT scans of the chest did not reveal any change until January 2015 when patient was noted to have a new spiculated mass in the lateral aspect of the right upper lobe, abutting the minor fissure. A PET scan was done which showed the new mass to be hypermetabolic. There is also a hypermetabolic right hilar lymph node. Plan at that time was to proceed with surgery however due to the patient's wife experiencing a stroke the patient deferred on surgery at that time. Patient returned in June of last year and a chest CT scan showed the nodule and actually decreased some in size. Scans however recently showed progression of this nodule as well as right hilar lymph node. A PET scan was performed showing the right hilar mass to be increased in size.   The patient proceeded to undergo biopsy which revealed poorly differentiated non-small cell lung cancer.  given the size the lesion and location was felt the patient may require a pneumonectomy for complete resection. Patient is now seen by radiation oncology and medical oncology for consideration for preoperative treatments.  PREVIOUS RADIATION THERAPY: No  PAST MEDICAL HISTORY:  has a past medical history of COPD (chronic obstructive pulmonary  disease); Stroke; Hypertension; Heart attack (1991); Pulmonary nodule; Coronary artery disease; Shortness of breath dyspnea; Diabetes mellitus; and Full dentures.    PAST SURGICAL HISTORY: Past Surgical History  Procedure Laterality Date  . Lung surgery      Dr Arlyce Dice  . Angioplasty    . Colonoscopy  10/22/2011    Procedure: COLONOSCOPY;  Surgeon: Rogene Houston, MD;  [facility];  Service: Endoscopy;  Laterality: N/A;  1200  . Carotid endarterectomy Right   . Video bronchoscopy with endobronchial ultrasound N/A 05/29/2014    Procedure: VIDEO BRONCHOSCOPY WITH ENDOBRONCHIAL ULTRASOUND;  Surgeon: Melrose Nakayama, MD;  Location: [facility] OR;  Service: Thoracic;  Laterality: N/A;    FAMILY HISTORY: family history includes Heart attack (age of onset: 33) in his father; Heart disease in his father and sister.  SOCIAL HISTORY:  reports that he has been smoking Cigarettes.  He has a 80 pack-year smoking history. He has never used smokeless tobacco. He reports that he drinks about 3.6 oz of alcohol per week. He reports that he does not use illicit drugs.  ALLERGIES: Zyban  MEDICATIONS:  Current Outpatient Prescriptions  Medication Sig Dispense Refill  . ACCU-CHEK AVIVA PLUS test strip USE TO CHECK ONCE OR TWICE DAILY 100 each 0  . ACCU-CHEK FASTCLIX LANCETS MISC Check blood sugar bid and prn 102 each 0  . aspirin EC 325 MG tablet Take 325 mg by mouth every morning. Hold for surgery    . atorvastatin (LIPITOR) 40 MG tablet TAKE ONE TABLET BY MOUTH ONE TIME DAILY 30  tablet 6  . Blood Glucose Monitoring Suppl (ONE TOUCH ULTRA 2) W/DEVICE KIT Use monitor to test blood sugar qd Dx 250.00 1 each 0  . Cholecalciferol (VITAMIN D) 2000 UNITS CAPS Take 1 capsule by mouth daily.    . metFORMIN (GLUCOPHAGE) 500 MG tablet Take 1 tablet (500 mg total) by mouth at bedtime. 30 tablet 6  . tiotropium (SPIRIVA) 18 MCG inhalation capsule Place 1 capsule (18 mcg total) into inhaler and inhale daily. 30  capsule 6  . valsartan (DIOVAN) 320 MG tablet TAKE ONE TABLET BY MOUTH ONE TIME DAILY 30 tablet 6   No current facility-administered medications for this encounter.    REVIEW OF SYSTEMS:  A 15 point review of systems is documented in the electronic medical record. This was obtained by the nursing staff. However, I reviewed this with the patient to discuss relevant findings and make appropriate changes.  The patient denies any pain within the chest significant cough or hemoptysis. He denies any new bony pain headaches dizziness or blurred vision. His appetite is good.  he denies any significant fatigue.   PHYSICAL EXAM:  weight is 121 lb 11.2 oz (55.203 kg). His temperature is 98.4 F (36.9 C). His blood pressure is 147/83 and his pulse is 89. His respiration is 19 and oxygen saturation is 99%.   BP 147/83 mmHg  Pulse 89  Temp(Src) 98.4 F (36.9 C)  Resp 19  Wt 121 lb 11.2 oz (55.203 kg)  SpO2 99%  General Appearance:    Alert, cooperative, no distress, appears stated age, accompanied by her niece on evaluation today   Head:    Normocephalic, without obvious abnormality, atraumatic  Eyes:    PERRL, conjunctiva/corneas clear, EOM's intact,       Ears:    Normal TM's and external ear canals, both ears  Nose:   Nares normal, septum midline, mucosa normal, no drainage    or sinus tenderness  Throat:   Lips, mucosa, and tongue normal; edentulous, gums normal  Neck:   Supple, symmetrical, trachea midline, no adenopathy;       thyroid:  No enlargement/tenderness/nodules; no carotid   bruit or JVD  Back:     Symmetric, no curvature, ROM normal, no CVA tenderness  Lungs:     Clear to auscultation bilaterally, respirations unlabored  Chest wall:    No tenderness or deformity, small scar from prior VATS surgery along the left side   Heart:    Regular rate and rhythm, S1 and S2 normal, no murmur, rub   or gallop  Abdomen:     Soft, non-tender, bowel sounds active all four quadrants,    no masses,  no organomegaly        Extremities:   Extremities normal, atraumatic, no cyanosis or edema  Pulses:   2+ and symmetric all extremities  Skin:   Skin color, texture, turgor normal, no rashes or lesions  Lymph nodes:   Cervical, supraclavicular, and axillary nodes normal  Neurologic:    Normal strength, sensation and reflexes      throughout     ECOG = 1    1 - Symptomatic but completely ambulatory (Restricted in physically strenuous activity but ambulatory and able to carry out work of a light or sedentary nature. For example, light housework, office work)  LABORATORY DATA:  Lab Results  Component Value Date   WBC 6.0 06/22/2014   HGB 13.4 06/22/2014   HCT 39.0 06/22/2014   MCV 90.3 06/22/2014   PLT  302 06/22/2014   NEUTROABS 4.0 06/22/2014   Lab Results  Component Value Date   NA 135* 06/22/2014   K 4.4 06/22/2014   CL 106 05/23/2014   CO2 23 06/22/2014   GLUCOSE 105 06/22/2014   CREATININE 1.0 06/22/2014   CALCIUM 9.9 06/22/2014      RADIOGRAPHY: Dg Chest 2 View  05/29/2014   CLINICAL DATA:  Preoperative chest radiograph for lung biopsy. Cough. Initial encounter.  EXAM: CHEST  2 VIEW  COMPARISON:  Chest radiograph performed 01/07/2012, and CT of the chest performed 05/09/2014  FINDINGS: The lungs are well-aerated. Chronically increased interstitial markings are noted. Postoperative change is noted at the left mid lung and left lung apex. Mild chronic nodularity is noted in the periphery of the lungs. There is no evidence of pleural effusion or pneumothorax.  The heart is normal in size; the known right hilar mass is partially characterized on the lateral view. No acute osseous abnormalities are seen.  IMPRESSION: 1. No acute cardiopulmonary process seen. Known right hilar mass is partially characterized on the lateral view. 2. Chronically increased interstitial markings and chronic nodularity in the periphery of the lungs.   Electronically Signed   By: Garald Balding M.D.    On: 05/29/2014 06:27   Mr Jeri Cos CY Contrast  06/01/2014   CLINICAL DATA:  Lung cancer diagnosed last month. No current therapy. Evaluation for metastatic disease.  EXAM: MRI HEAD WITHOUT AND WITH CONTRAST  TECHNIQUE: Multiplanar, multiecho pulse sequences of the brain and surrounding structures were obtained without and with intravenous contrast.  CONTRAST:  32mL MULTIHANCE GADOBENATE DIMEGLUMINE 529 MG/ML IV SOLN  COMPARISON:  06/13/2013  FINDINGS: There is no evidence of acute infarct, intracranial hemorrhage, mass, midline shift, or extra-axial fluid collection. Ventricles and sulci are within normal limits for age. Small foci of T2 hyperintensity throughout the subcortical and deep cerebral white matter and pons are unchanged and nonspecific but compatible with mild chronic small vessel ischemic disease. A chronic infarct is again seen in the right basal ganglia. There is no abnormal enhancement.  Orbits are unremarkable. No inflammatory changes are identified in the paranasal sinuses or mastoid air cells. Major intracranial vascular flow voids are preserved.  IMPRESSION: 1. No evidence of intracranial metastases. 2. Mild chronic small vessel ischemic disease and chronic right basal ganglia infarct.   Electronically Signed   By: Logan Bores   On: 06/01/2014 09:02   Nm Pet Image Initial (pi) Skull Base To Thigh  06/08/2014   CLINICAL DATA:  Initial treatment strategy for Lung cancer.  EXAM: NUCLEAR MEDICINE PET SKULL BASE TO THIGH  TECHNIQUE: 6.2 mCi F-18 FDG was injected intravenously. Full-ring PET imaging was performed from the skull base to thigh after the radiotracer. CT data was obtained and used for attenuation correction and anatomic localization.  FASTING BLOOD GLUCOSE:  Value: 147 mg/dl  COMPARISON:  05/13/2013  FINDINGS: NECK  No hypermetabolic lymph nodes in the neck.  CHEST  Within the right upper lobe there is a hypermetabolic spiculated nodule measuring 9 mm. This has an SUV max within the  malignant range at 2.7. On the exam from 05/13/2013 this lesion measured 1.7 and had an SUV max equal to 5.1. Anterior right upper lobe nodule is stable measuring 6 mm, image 24/series 6. The 5 mm right apical nodule is stable, image 17/series 6.  Enlarged right hilar lymph node measures 3.2 cm and has an SUV max equal to 10.8. Previously this measured 1.3 cm and had  an SUV max equal to 4.0. No hypermetabolic mediastinal or contralateral hilar lymph nodes.  There is atherosclerotic disease involving the thoracic aorta as well as the LAD and RCA Coronary arteries.  ABDOMEN/PELVIS  No abnormal hypermetabolic activity within the liver, pancreas, adrenal glands, or spleen. No hypermetabolic lymph nodes in the abdomen or pelvis. Small stones are identified within the right renal collecting system. There is calcified atherosclerotic disease involving the abdominal aorta. No aneurysm.  SKELETON  No focal hypermetabolic activity to suggest skeletal metastasis.  IMPRESSION: 1. Persistent, hypermetabolic nodule within the right upper lobe is identified and is concerning for primary bronchogenic carcinoma. 2. Enlarged and hypermetabolic right hilar lymph node. When compared with previous exam the size of this lymph node and the degree of FDG uptake has increased in the interval and is highly worrisome for metastatic adenopathy. 3. No evidence for distant metastatic disease. 4. Smaller nodules within the right upper lobe are unchanged from 05/13/2013 in remain 2 smaller reliably characterize by PET-CT.   Electronically Signed   By: Kerby Moors M.D.   On: 06/08/2014 10:02      IMPRESSION: Stage IIa (T1, N1, M0) poorly differentiated non-small cell lung cancer. The patient would require a right pneumonectomy due to the location of his right hilar mass and size of this lesion. Patient's case was presented at the multidisciplinary conference this morning and it was felt that preoperative treatments would be the patient's best  approach to potentially allow a more limited surgical resection at a later date. [place] and would prefer to have his radiation treatments at the [facility] in [place].  Today I discussed the treatment course side effects and potential toxicities of radiation therapy directed at the chest region with the patient and his niece. The patient appears to understand and wishes to proceed with planned course of treatment.   Plan: simulation and planning at 9 AM at the [facility] in [place] tomorrow. The patient will start his treatments the week of March 14. He will receive a preoperative dose of approximately 45 gray along with radiosensitizing chemotherapy.     ------------------------------------------------  Blair Promise, PhD, MD

## 2014-06-22 NOTE — Therapy (Signed)
[place], [place], [address] Phone: [phone]   Fax:  [phone]  Physical Therapy Evaluation  Patient Details  Name: Adam Warner MRN: [id number] Date of Birth: January 04, 1956 Referring Provider:  Curt Bears, MD  Encounter Date: 06/22/2014      PT End of Session - 06/22/14 1643    Visit Number 1   Number of Visits 1   PT Start Time 1609   PT Stop Time 1628   PT Time Calculation (min) 19 min   Activity Tolerance Patient tolerated treatment well   Behavior During Therapy [facility] for tasks assessed/performed      Past Medical History  Diagnosis Date  . COPD (chronic obstructive pulmonary disease)   . Stroke   . Hypertension   . Heart attack 1991  . Pulmonary nodule   . Coronary artery disease     sees Dr Spero Curb at [facility] every 2-3 years  . Shortness of breath dyspnea   . Diabetes mellitus     Type Niddm x 2 years  . Full dentures     Past Surgical History  Procedure Laterality Date  . Lung surgery      Dr Arlyce Dice  . Angioplasty    . Colonoscopy  10/22/2011    Procedure: COLONOSCOPY;  Surgeon: Rogene Houston, MD;  [facility];  Service: Endoscopy;  Laterality: N/A;  1200  . Carotid endarterectomy Right   . Video bronchoscopy with endobronchial ultrasound N/A 05/29/2014    Procedure: VIDEO BRONCHOSCOPY WITH ENDOBRONCHIAL ULTRASOUND;  Surgeon: Melrose Nakayama, MD;  Location: [place];  Service: Thoracic;  Laterality: N/A;    There were no vitals taken for this visit.  Visit Diagnosis:  Impairment of balance - Plan: PT plan of care cert/re-cert  Physical deconditioning - Plan: PT plan of care cert/re-cert      Subjective Assessment - 06/22/14 1633    Symptoms mild shortness of breath   Pertinent History Pt. was followed with scans due to a h/o lung nodule; diagnosed with right upper lobe invasive poorly differentiated carcinoma with positive node, so stage II.   Expected to have neo-adjuvant chemotherapy, limited radiation, and then resection. Smoker, COPD, CAD, TIA about 10 years ago; VATS 2007 which was found to be inflammatory disease.  Brain MR was negative.    Currently in Pain? No/denies          [facility] PT Assessment - 06/22/14 0001    Assessment   Medical Diagnosis right upper lobe invasive poorly differentiated carcinoma   Precautions   Precautions Other (comment)   Precaution Comments cancer precautions   Restrictions   Weight Bearing Restrictions No   Balance Screen   Has the patient fallen in the past 6 months No   Has the patient had a decrease in activity level because of a fear of falling?  No   Is the patient reluctant to leave their home because of a fear of falling?  No   Home Environment   Living Enviornment Private residence   Living Arrangements Spouse/significant other  wife has had stroke; patient helps her with physical activit   Type of [place] Two level  denies difficulty negotiating stairs   Prior Function   Level of Independence Independent with basic ADLs;Independent with homemaking with ambulation;Independent with gait   Vocation Other (comment)  takes care of his disabled wife   Sensation   Light Touch Not tested  denies numbness/tingling  in extremities   Functional Tests   Functional tests Sit to Stand   Sit to Stand   Comments 11 times in 30 seconds   Posture/Postural Control   Posture/Postural Control Postural limitations   Postural Limitations Forward head;Rounded Shoulders;Flexed trunk   Posture Comments slight forward flexion at times   ROM / Strength   AROM / PROM / Strength AROM;Strength   AROM   Overall AROM Comments standing trunk AROM limited 25% in extension; others WFL.  Pt. had loss of balance on return to erect standing from extension in standing   Strength   Overall Strength Within functional limits for tasks performed   Ambulation/Gait   Ambulation/Gait Yes    Ambulation/Gait Assistance 6: Modified independent (Device/Increase time)  mild shortness of breath, per his report   Balance   Balance Assessed Yes   Dynamic Standing Balance   Dynamic Standing - Comments reached 12 inches forward in standing                          PT Education - 06/22/14 1642    Education provided Yes   Education Details posture, breathing, walking, energy conservation   Person(s) Educated Patient;Other (comment)  niece   Methods Explanation;Handout   Comprehension Verbalized understanding               Lung Clinic Goals - 06/22/14 1646    Patient will be able to verbalize understanding of the benefit of exercise to decrease fatigue.   Status Achieved   Patient will be able to demonstrate diaphragmatic breathing for improved lung function.   Status Achieved   Patient will be able to verbalize understanding of the role of physical therapy to prevent functional decline and who to contact if physical therapy is needed.   Status Achieved             Plan - 06/22/14 1643    Clinical Impression Statement Patient with h/o TIA reports intermittent difficulty with balance and had slight loss of balance during assessment today; also reports shortness of breath with walking a distance.   Pt will benefit from skilled therapeutic intervention in order to improve on the following deficits Decreased balance;Decreased endurance   Rehab Potential Good   PT Frequency One time visit   PT Treatment/Interventions Patient/family education   PT Next Visit Plan None at this time; may benefit from therapy going forward for balance, endurance.   PT Home Exercise Plan see education section   Consulted and Agree with Plan of Care Patient         Problem List Patient Active Problem List   Diagnosis Date Noted  . Non-small cell carcinoma of lung, stage 2 06/22/2014  . FH: lung cancer 06/13/2014  . CAD, NATIVE VESSEL 03/08/2010  . CAROTID ARTERY  STENOSIS, WITHOUT INFARCTION 03/08/2010  . LEG PAIN 03/08/2010  . SYNCOPE AND COLLAPSE 03/08/2010  . ABNORMAL CV (STRESS) TEST 03/08/2010  . HYPERLIPIDEMIA 02/12/2010  . SMOKER 02/12/2010  . C V A / STROKE 02/12/2010  . COPD GOLD II 02/12/2010  . Pulmonary nodule 02/12/2010  . ABNORMAL LUNG XRAY 02/12/2010  . DIABETES MELLITUS 02/11/2010  . HYPERTENSION 02/11/2010  . CAD 02/11/2010  . TIA 02/11/2010    SALIS[place],DONNA 06/22/2014, 4:48 PM  [place], [place], [address] Phone: [phone]   Fax:  [place], PT 06/22/2014 4:48 PM

## 2014-06-22 NOTE — Patient Instructions (Signed)
Smoking Cessation Quitting smoking is important to your health and has many advantages. However, it is not always easy to quit since nicotine is a very addictive drug. Oftentimes, people try 3 times or more before being able to quit. This document explains the best ways for you to prepare to quit smoking. Quitting takes hard work and a lot of effort, but you can do it. ADVANTAGES OF QUITTING SMOKING  You will live longer, feel better, and live better.  Your body will feel the impact of quitting smoking almost immediately.  Within 20 minutes, blood pressure decreases. Your pulse returns to its normal level.  After 8 hours, carbon monoxide levels in the blood return to normal. Your oxygen level increases.  After 24 hours, the chance of having a heart attack starts to decrease. Your breath, hair, and body stop smelling like smoke.  After 48 hours, damaged nerve endings begin to recover. Your sense of taste and smell improve.  After 72 hours, the body is virtually free of nicotine. Your bronchial tubes relax and breathing becomes easier.  After 2 to 12 weeks, lungs can hold more air. Exercise becomes easier and circulation improves.  The risk of having a heart attack, stroke, cancer, or lung disease is greatly reduced.  After 1 year, the risk of coronary heart disease is cut in half.  After 5 years, the risk of stroke falls to the same as a nonsmoker.  After 10 years, the risk of lung cancer is cut in half and the risk of other cancers decreases significantly.  After 15 years, the risk of coronary heart disease drops, usually to the level of a nonsmoker.  If you are pregnant, quitting smoking will improve your chances of having a healthy baby.  The people you live with, especially any children, will be healthier.  You will have extra money to spend on things other than cigarettes. QUESTIONS TO THINK ABOUT BEFORE ATTEMPTING TO QUIT You may want to talk about your answers with your  health care provider.  Why do you want to quit?  If you tried to quit in the past, what helped and what did not?  What will be the most difficult situations for you after you quit? How will you plan to handle them?  Who can help you through the tough times? Your family? Friends? A health care provider?  What pleasures do you get from smoking? What ways can you still get pleasure if you quit? Here are some questions to ask your health care provider:  How can you help me to be successful at quitting?  What medicine do you think would be best for me and how should I take it?  What should I do if I need more help?  What is smoking withdrawal like? How can I get information on withdrawal? GET READY  Set a quit date.  Change your environment by getting rid of all cigarettes, ashtrays, matches, and lighters in your home, car, or work. Do not let people smoke in your home.  Review your past attempts to quit. Think about what worked and what did not. GET SUPPORT AND ENCOURAGEMENT You have a better chance of being successful if you have help. You can get support in many ways.  Tell your family, friends, and coworkers that you are going to quit and need their support. Ask them not to smoke around you.  Get individual, group, or telephone counseling and support. Programs are available at local hospitals and health centers. Call   your local health department for information about programs in your area.  Spiritual beliefs and practices may help some smokers quit.  Download a "quit meter" on your computer to keep track of quit statistics, such as how long you have gone without smoking, cigarettes not smoked, and money saved.  Get a self-help book about quitting smoking and staying off tobacco. LEARN NEW SKILLS AND BEHAVIORS  Distract yourself from urges to smoke. Talk to someone, go for a walk, or occupy your time with a task.  Change your normal routine. Take a different route to work.  Drink tea instead of coffee. Eat breakfast in a different place.  Reduce your stress. Take a hot bath, exercise, or read a book.  Plan something enjoyable to do every day. Reward yourself for not smoking.  Explore interactive web-based programs that specialize in helping you quit. GET MEDICINE AND USE IT CORRECTLY Medicines can help you stop smoking and decrease the urge to smoke. Combining medicine with the above behavioral methods and support can greatly increase your chances of successfully quitting smoking.  Nicotine replacement therapy helps deliver nicotine to your body without the negative effects and risks of smoking. Nicotine replacement therapy includes nicotine gum, lozenges, inhalers, nasal sprays, and skin patches. Some may be available over-the-counter and others require a prescription.  Antidepressant medicine helps people abstain from smoking, but how this works is unknown. This medicine is available by prescription.  Nicotinic receptor partial agonist medicine simulates the effect of nicotine in your brain. This medicine is available by prescription. Ask your health care provider for advice about which medicines to use and how to use them based on your health history. Your health care provider will tell you what side effects to look out for if you choose to be on a medicine or therapy. Carefully read the information on the package. Do not use any other product containing nicotine while using a nicotine replacement product.  RELAPSE OR DIFFICULT SITUATIONS Most relapses occur within the first 3 months after quitting. Do not be discouraged if you start smoking again. Remember, most people try several times before finally quitting. You may have symptoms of withdrawal because your body is used to nicotine. You may crave cigarettes, be irritable, feel very hungry, cough often, get headaches, or have difficulty concentrating. The withdrawal symptoms are only temporary. They are strongest  when you first quit, but they will go away within 10-14 days. To reduce the chances of relapse, try to:  Avoid drinking alcohol. Drinking lowers your chances of successfully quitting.  Reduce the amount of caffeine you consume. Once you quit smoking, the amount of caffeine in your body increases and can give you symptoms, such as a rapid heartbeat, sweating, and anxiety.  Avoid smokers because they can make you want to smoke.  Do not let weight gain distract you. Many smokers will gain weight when they quit, usually less than 10 pounds. Eat a healthy diet and stay active. You can always lose the weight gained after you quit.  Find ways to improve your mood other than smoking. FOR MORE INFORMATION  www.smokefree.gov  Document Released: 04/01/2001 Document Revised: 08/22/2013 Document Reviewed: 07/17/2011 Exit[employer] Patient Information 2015 [employer]. This information is not intended to replace advice given to you by your health care provider. Make sure you discuss any questions you have with your health care provider.  

## 2014-06-23 ENCOUNTER — Telehealth: Payer: Self-pay | Admitting: Internal Medicine

## 2014-06-23 NOTE — Telephone Encounter (Signed)
s.w. pt and advised n March 14 appt....pt ok and aware...Marland Kitchenhe will get appt sched on 3.14

## 2014-06-28 ENCOUNTER — Encounter: Payer: Self-pay | Admitting: *Deleted

## 2014-06-28 ENCOUNTER — Telehealth: Payer: Self-pay | Admitting: *Deleted

## 2014-06-28 NOTE — Telephone Encounter (Signed)
Called patient to follow up from thoracic clinic last week.  I spoke with his wife, he was not at home.  She stated her husband seemed to be in good spirits.  He is going to receive radiation ts at [facility].  Wife stated that is being arranged.  I asked that she notify me if there is a problem with that appt.  I also updated her on his first chemo appt, she stated she is aware.

## 2014-06-28 NOTE — [facility] Oncology Navigator Note (Unsigned)
See phone note

## 2014-06-29 ENCOUNTER — Telehealth: Payer: Self-pay | Admitting: *Deleted

## 2014-06-29 ENCOUNTER — Other Ambulatory Visit: Payer: Self-pay | Admitting: Internal Medicine

## 2014-06-29 DIAGNOSIS — C3491 Malignant neoplasm of unspecified part of right bronchus or lung: Secondary | ICD-10-CM

## 2014-06-29 MED ORDER — PROCHLORPERAZINE MALEATE 10 MG PO TABS: 10.0000 mg | ORAL_TABLET | Freq: Four times a day (QID) | ORAL | Status: DC | PRN

## 2014-06-29 NOTE — Telephone Encounter (Signed)
Someone left a message (male who did not identify herself).  They are expecting two medications to be called in to [facility] - a steroid and something for nausea.  So far nothing has been called in.  Will forward this message to the RN with Dr. Julien Nordmann today, Armanda Magic and Dr. Julien Nordmann.

## 2014-06-29 NOTE — Telephone Encounter (Signed)
I called Compazine to his pharmacy. I'm not sure about the steroid.

## 2014-07-03 ENCOUNTER — Other Ambulatory Visit: Payer: Medicaid Other

## 2014-07-03 ENCOUNTER — Other Ambulatory Visit (HOSPITAL_BASED_OUTPATIENT_CLINIC_OR_DEPARTMENT_OTHER): Payer: Medicaid Other

## 2014-07-03 ENCOUNTER — Other Ambulatory Visit: Payer: Self-pay | Admitting: Internal Medicine

## 2014-07-03 ENCOUNTER — Ambulatory Visit (HOSPITAL_BASED_OUTPATIENT_CLINIC_OR_DEPARTMENT_OTHER): Payer: Medicaid Other

## 2014-07-03 ENCOUNTER — Encounter: Payer: Self-pay | Admitting: *Deleted

## 2014-07-03 VITALS — BP 131/79 | HR 67 | Temp 97.7°F | Resp 20

## 2014-07-03 DIAGNOSIS — C3491 Malignant neoplasm of unspecified part of right bronchus or lung: Secondary | ICD-10-CM

## 2014-07-03 DIAGNOSIS — C3411 Malignant neoplasm of upper lobe, right bronchus or lung: Secondary | ICD-10-CM

## 2014-07-03 DIAGNOSIS — C3490 Malignant neoplasm of unspecified part of unspecified bronchus or lung: Secondary | ICD-10-CM

## 2014-07-03 DIAGNOSIS — Z5111 Encounter for antineoplastic chemotherapy: Secondary | ICD-10-CM

## 2014-07-03 LAB — COMPREHENSIVE METABOLIC PANEL (CC13)
ALBUMIN: 3.3 g/dL — AB (ref 3.5–5.0)
ALT: 13 U/L (ref 0–55)
AST: 12 U/L (ref 5–34)
Alkaline Phosphatase: 109 U/L (ref 40–150)
Anion Gap: 9 mEq/L (ref 3–11)
BUN: 9.7 mg/dL (ref 7.0–26.0)
CO2: 24 meq/L (ref 22–29)
Calcium: 9.5 mg/dL (ref 8.4–10.4)
Chloride: 105 mEq/L (ref 98–109)
Creatinine: 1 mg/dL (ref 0.7–1.3)
EGFR: 82 mL/min/{1.73_m2} — ABNORMAL LOW (ref >90)
GLUCOSE: 210 mg/dL — AB (ref 70–140)
Potassium: 4.6 mEq/L (ref 3.5–5.1)
Sodium: 138 mEq/L (ref 136–145)
TOTAL PROTEIN: 7.3 g/dL (ref 6.4–8.3)
Total Bilirubin: 0.38 mg/dL (ref 0.20–1.20)

## 2014-07-03 LAB — CBC WITH DIFFERENTIAL/PLATELET
BASO%: 0.6 % (ref 0.0–2.0)
Basophils Absolute: 0 10*3/uL (ref 0.0–0.1)
EOS%: 1.3 % (ref 0.0–7.0)
Eosinophils Absolute: 0.1 10*3/uL (ref 0.0–0.5)
HCT: 38.2 % — ABNORMAL LOW (ref 38.4–49.9)
HGB: 12.6 g/dL — ABNORMAL LOW (ref 13.0–17.1)
LYMPH#: 1.5 10*3/uL (ref 0.9–3.3)
LYMPH%: 27.3 % (ref 14.0–49.0)
MCH: 30.5 pg (ref 27.2–33.4)
MCHC: 33 g/dL (ref 32.0–36.0)
MCV: 92.5 fL (ref 79.3–98.0)
MONO#: 0.3 10*3/uL (ref 0.1–0.9)
MONO%: 5.2 % (ref 0.0–14.0)
NEUT#: 3.6 10*3/uL (ref 1.5–6.5)
NEUT%: 65.6 % (ref 39.0–75.0)
Platelets: 303 10*3/uL (ref 140–400)
RBC: 4.13 10*6/uL — ABNORMAL LOW (ref 4.20–5.82)
RDW: 13.5 % (ref 11.0–14.6)
WBC: 5.4 10*3/uL (ref 4.0–10.3)

## 2014-07-03 MED ORDER — PACLITAXEL CHEMO INJECTION 300 MG/50ML
45.0000 mg/m2 | Freq: Once | INTRAVENOUS | Status: AC
  Administered 2014-07-03: 72 mg via INTRAVENOUS
  Filled 2014-07-03: qty 12

## 2014-07-03 MED ORDER — SODIUM CHLORIDE 0.9 % IV SOLN
Freq: Once | INTRAVENOUS | Status: AC
  Administered 2014-07-03: 14:00:00 via INTRAVENOUS
  Filled 2014-07-03: qty 8

## 2014-07-03 MED ORDER — FAMOTIDINE IN NACL 20-0.9 MG/50ML-% IV SOLN
INTRAVENOUS | Status: AC
  Filled 2014-07-03: qty 50

## 2014-07-03 MED ORDER — METHYLPREDNISOLONE 4 MG PO KIT: PACK | ORAL | Status: DC

## 2014-07-03 MED ORDER — SODIUM CHLORIDE 0.9 % IV SOLN
172.6000 mg | Freq: Once | INTRAVENOUS | Status: AC
  Administered 2014-07-03: 170 mg via INTRAVENOUS
  Filled 2014-07-03: qty 17

## 2014-07-03 MED ORDER — DIPHENHYDRAMINE HCL 50 MG/ML IJ SOLN
50.0000 mg | Freq: Once | INTRAMUSCULAR | Status: AC
  Administered 2014-07-03: 50 mg via INTRAVENOUS

## 2014-07-03 MED ORDER — SODIUM CHLORIDE 0.9 % IV SOLN
Freq: Once | INTRAVENOUS | Status: AC
  Administered 2014-07-03: 13:00:00 via INTRAVENOUS

## 2014-07-03 MED ORDER — FAMOTIDINE IN NACL 20-0.9 MG/50ML-% IV SOLN
20.0000 mg | Freq: Once | INTRAVENOUS | Status: AC
  Administered 2014-07-03: 20 mg via INTRAVENOUS

## 2014-07-03 MED ORDER — DIPHENHYDRAMINE HCL 50 MG/ML IJ SOLN
INTRAMUSCULAR | Status: AC
  Filled 2014-07-03: qty 1

## 2014-07-03 NOTE — [facility] Oncology Navigator Note (Unsigned)
Spoke with patient today at his first chemotherapy appt.  He is doing well.  He had a concern about some medication for appetite.  I spoke with Dr. Julien Nordmann. He ordered a medrol dose pack.  I updated patient and chemo nurses.  Patient verbalized understanding.  Dr. Julien Nordmann stated I could make a referral to dietition.    Patient also verbalized that his wife had a stroke and he takes care of her at home.  There is no other family that lives at the home with them.  A niece does come and bring him to appointments at cancer center.  I will update CSW for assistance.

## 2014-07-03 NOTE — Patient Instructions (Addendum)
[place] Discharge Instructions for Patients Receiving Chemotherapy  Today you received the following chemotherapy agents:  Taxol and Carboplatin  To help prevent nausea and vomiting after your treatment, we encourage you to take your nausea medication as ordered per MD.   If you develop nausea and vomiting that is not controlled by your nausea medication, call the clinic.   BELOW ARE SYMPTOMS THAT SHOULD BE REPORTED IMMEDIATELY:  *FEVER GREATER THAN 100.5 F  *CHILLS WITH OR WITHOUT FEVER  NAUSEA AND VOMITING THAT IS NOT CONTROLLED WITH YOUR NAUSEA MEDICATION  *UNUSUAL SHORTNESS OF BREATH  *UNUSUAL BRUISING OR BLEEDING  TENDERNESS IN MOUTH AND THROAT WITH OR WITHOUT PRESENCE OF ULCERS  *URINARY PROBLEMS  *BOWEL PROBLEMS  UNUSUAL RASH Items with * indicate a potential emergency and should be followed up as soon as possible.  Feel free to call the clinic you have any questions or concerns. The clinic phone number is [phone].   Please remember to pick up steroids from your pharmacy to improve appetite   Carboplatin injection What is this medicine? CARBOPLATIN (KAR boe pla tin) is a chemotherapy drug. It targets fast dividing cells, like cancer cells, and causes these cells to die. This medicine is used to treat ovarian cancer and many other cancers. This medicine may be used for other purposes; ask your health care provider or pharmacist if you have questions. COMMON BRAND NAME(S): Paraplatin What should I tell my health care provider before I take this medicine? They need to know if you have any of these conditions: -blood disorders -hearing problems -kidney disease -recent or ongoing radiation therapy -an unusual or allergic reaction to carboplatin, cisplatin, other chemotherapy, other medicines, foods, dyes, or preservatives -pregnant or trying to get pregnant -breast-feeding How should I use this medicine? This drug is usually given as an  infusion into a vein. It is administered in a hospital or clinic by a specially trained health care professional. Talk to your pediatrician regarding the use of this medicine in children. Special care may be needed. Overdosage: If you think you have taken too much of this medicine contact a poison control center or emergency room at once. NOTE: This medicine is only for you. Do not share this medicine with others. What if I miss a dose? It is important not to miss a dose. Call your doctor or health care professional if you are unable to keep an appointment. What may interact with this medicine? -medicines for seizures -medicines to increase blood counts like filgrastim, pegfilgrastim, sargramostim -some antibiotics like amikacin, gentamicin, neomycin, streptomycin, tobramycin -vaccines Talk to your doctor or health care professional before taking any of these medicines: -acetaminophen -aspirin -ibuprofen -ketoprofen -naproxen This list may not describe all possible interactions. Give your health care provider a list of all the medicines, herbs, non-prescription drugs, or dietary supplements you use. Also tell them if you smoke, drink alcohol, or use illegal drugs. Some items may interact with your medicine. What should I watch for while using this medicine? Your condition will be monitored carefully while you are receiving this medicine. You will need important blood work done while you are taking this medicine. This drug may make you feel generally unwell. This is not uncommon, as chemotherapy can affect healthy cells as well as cancer cells. Report any side effects. Continue your course of treatment even though you feel ill unless your doctor tells you to stop. In some cases, you may be given additional medicines to help with side  effects. Follow all directions for their use. Call your doctor or health care professional for advice if you get a fever, chills or sore throat, or other symptoms  of a cold or flu. Do not treat yourself. This drug decreases your body's ability to fight infections. Try to avoid being around people who are sick. This medicine may increase your risk to bruise or bleed. Call your doctor or health care professional if you notice any unusual bleeding. Be careful brushing and flossing your teeth or using a toothpick because you may get an infection or bleed more easily. If you have any dental work done, tell your dentist you are receiving this medicine. Avoid taking products that contain aspirin, acetaminophen, ibuprofen, naproxen, or ketoprofen unless instructed by your doctor. These medicines may hide a fever. Do not become pregnant while taking this medicine. Women should inform their doctor if they wish to become pregnant or think they might be pregnant. There is a potential for serious side effects to an unborn child. Talk to your health care professional or pharmacist for more information. Do not breast-feed an infant while taking this medicine. What side effects may I notice from receiving this medicine? Side effects that you should report to your doctor or health care professional as soon as possible: -allergic reactions like skin rash, itching or hives, swelling of the face, lips, or tongue -signs of infection - fever or chills, cough, sore throat, pain or difficulty passing urine -signs of decreased platelets or bleeding - bruising, pinpoint red spots on the skin, black, tarry stools, nosebleeds -signs of decreased red blood cells - unusually weak or tired, fainting spells, lightheadedness -breathing problems -changes in hearing -changes in vision -chest pain -high blood pressure -low blood counts - This drug may decrease the number of white blood cells, red blood cells and platelets. You may be at increased risk for infections and bleeding. -nausea and vomiting -pain, swelling, redness or irritation at the injection site -pain, tingling, numbness in the  hands or feet -problems with balance, talking, walking -trouble passing urine or change in the amount of urine Side effects that usually do not require medical attention (report to your doctor or health care professional if they continue or are bothersome): -hair loss -loss of appetite -metallic taste in the mouth or changes in taste This list may not describe all possible side effects. Call your doctor for medical advice about side effects. You may report side effects to FDA at 1-800-FDA-1088. Where should I keep my medicine? This drug is given in a hospital or clinic and will not be stored at home. NOTE: This sheet is a summary. It may not cover all possible information. If you have questions about this medicine, talk to your doctor, pharmacist, or health care provider.  2015, Else[employer] Standard. (2007-07-13 14:38:05) Paclitaxel injection What is this medicine? PACLITAXEL (PAK li TAX el) is a chemotherapy drug. It targets fast dividing cells, like cancer cells, and causes these cells to die. This medicine is used to treat ovarian cancer, breast cancer, and other cancers. This medicine may be used for other purposes; ask your health care provider or pharmacist if you have questions. COMMON BRAND NAME(S): Onxol, Taxol What should I tell my health care provider before I take this medicine? They need to know if you have any of these conditions: -blood disorders -irregular heartbeat -infection (especially a virus infection such as chickenpox, cold sores, or herpes) -liver disease -previous or ongoing radiation therapy -an unusual or allergic reaction  to paclitaxel, alcohol, polyoxyethylated castor oil, other chemotherapy agents, other medicines, foods, dyes, or preservatives -pregnant or trying to get pregnant -breast-feeding How should I use this medicine? This drug is given as an infusion into a vein. It is administered in a hospital or clinic by a specially trained health care  professional. Talk to your pediatrician regarding the use of this medicine in children. Special care may be needed. Overdosage: If you think you have taken too much of this medicine contact a poison control center or emergency room at once. NOTE: This medicine is only for you. Do not share this medicine with others. What if I miss a dose? It is important not to miss your dose. Call your doctor or health care professional if you are unable to keep an appointment. What may interact with this medicine? Do not take this medicine with any of the following medications: -disulfiram -metronidazole This medicine may also interact with the following medications: -cyclosporine -diazepam -ketoconazole -medicines to increase blood counts like filgrastim, pegfilgrastim, sargramostim -other chemotherapy drugs like cisplatin, doxorubicin, epirubicin, etoposide, teniposide, vincristine -quinidine -testosterone -vaccines -verapamil Talk to your doctor or health care professional before taking any of these medicines: -acetaminophen -aspirin -ibuprofen -ketoprofen -naproxen This list may not describe all possible interactions. Give your health care provider a list of all the medicines, herbs, non-prescription drugs, or dietary supplements you use. Also tell them if you smoke, drink alcohol, or use illegal drugs. Some items may interact with your medicine. What should I watch for while using this medicine? Your condition will be monitored carefully while you are receiving this medicine. You will need important blood work done while you are taking this medicine. This drug may make you feel generally unwell. This is not uncommon, as chemotherapy can affect healthy cells as well as cancer cells. Report any side effects. Continue your course of treatment even though you feel ill unless your doctor tells you to stop. In some cases, you may be given additional medicines to help with side effects. Follow all  directions for their use. Call your doctor or health care professional for advice if you get a fever, chills or sore throat, or other symptoms of a cold or flu. Do not treat yourself. This drug decreases your body's ability to fight infections. Try to avoid being around people who are sick. This medicine may increase your risk to bruise or bleed. Call your doctor or health care professional if you notice any unusual bleeding. Be careful brushing and flossing your teeth or using a toothpick because you may get an infection or bleed more easily. If you have any dental work done, tell your dentist you are receiving this medicine. Avoid taking products that contain aspirin, acetaminophen, ibuprofen, naproxen, or ketoprofen unless instructed by your doctor. These medicines may hide a fever. Do not become pregnant while taking this medicine. Women should inform their doctor if they wish to become pregnant or think they might be pregnant. There is a potential for serious side effects to an unborn child. Talk to your health care professional or pharmacist for more information. Do not breast-feed an infant while taking this medicine. Men are advised not to father a child while receiving this medicine. What side effects may I notice from receiving this medicine? Side effects that you should report to your doctor or health care professional as soon as possible: -allergic reactions like skin rash, itching or hives, swelling of the face, lips, or tongue -low blood counts - This drug  may decrease the number of white blood cells, red blood cells and platelets. You may be at increased risk for infections and bleeding. -signs of infection - fever or chills, cough, sore throat, pain or difficulty passing urine -signs of decreased platelets or bleeding - bruising, pinpoint red spots on the skin, black, tarry stools, nosebleeds -signs of decreased red blood cells - unusually weak or tired, fainting spells,  lightheadedness -breathing problems -chest pain -high or low blood pressure -mouth sores -nausea and vomiting -pain, swelling, redness or irritation at the injection site -pain, tingling, numbness in the hands or feet -slow or irregular heartbeat -swelling of the ankle, feet, hands Side effects that usually do not require medical attention (report to your doctor or health care professional if they continue or are bothersome): -bone pain -complete hair loss including hair on your head, underarms, pubic hair, eyebrows, and eyelashes -changes in the color of fingernails -diarrhea -loosening of the fingernails -loss of appetite -muscle or joint pain -red flush to skin -sweating This list may not describe all possible side effects. Call your doctor for medical advice about side effects. You may report side effects to FDA at 1-800-FDA-1088. Where should I keep my medicine? This drug is given in a hospital or clinic and will not be stored at home. NOTE: This sheet is a summary. It may not cover all possible information. If you have questions about this medicine, talk to your doctor, pharmacist, or health care provider.  2015, [employer]vier/Gold Standard. (2012-05-31 16:41:21)

## 2014-07-04 ENCOUNTER — Other Ambulatory Visit: Payer: Self-pay | Admitting: Medical Oncology

## 2014-07-04 ENCOUNTER — Telehealth: Payer: Self-pay | Admitting: Medical Oncology

## 2014-07-04 NOTE — Telephone Encounter (Signed)
Pt received medrol pack .

## 2014-07-07 ENCOUNTER — Other Ambulatory Visit: Payer: Self-pay | Admitting: Medical Oncology

## 2014-07-07 DIAGNOSIS — C3490 Malignant neoplasm of unspecified part of unspecified bronchus or lung: Secondary | ICD-10-CM

## 2014-07-10 ENCOUNTER — Ambulatory Visit: Payer: Medicaid Other

## 2014-07-10 ENCOUNTER — Encounter: Payer: Self-pay | Admitting: Physician Assistant

## 2014-07-10 ENCOUNTER — Ambulatory Visit: Payer: Medicaid Other | Admitting: Physician Assistant

## 2014-07-10 ENCOUNTER — Other Ambulatory Visit: Payer: Medicaid Other

## 2014-07-10 ENCOUNTER — Ambulatory Visit (HOSPITAL_BASED_OUTPATIENT_CLINIC_OR_DEPARTMENT_OTHER): Payer: Medicaid Other

## 2014-07-10 ENCOUNTER — Other Ambulatory Visit (HOSPITAL_BASED_OUTPATIENT_CLINIC_OR_DEPARTMENT_OTHER): Payer: Medicaid Other

## 2014-07-10 ENCOUNTER — Ambulatory Visit (HOSPITAL_BASED_OUTPATIENT_CLINIC_OR_DEPARTMENT_OTHER): Payer: Medicaid Other | Admitting: Physician Assistant

## 2014-07-10 VITALS — BP 110/69 | HR 110 | Temp 98.2°F | Resp 18 | Ht 65.0 in | Wt 119.4 lb

## 2014-07-10 DIAGNOSIS — C3411 Malignant neoplasm of upper lobe, right bronchus or lung: Secondary | ICD-10-CM

## 2014-07-10 DIAGNOSIS — J449 Chronic obstructive pulmonary disease, unspecified: Secondary | ICD-10-CM | POA: Diagnosis not present

## 2014-07-10 DIAGNOSIS — C3490 Malignant neoplasm of unspecified part of unspecified bronchus or lung: Secondary | ICD-10-CM

## 2014-07-10 DIAGNOSIS — I10 Essential (primary) hypertension: Secondary | ICD-10-CM

## 2014-07-10 DIAGNOSIS — C3491 Malignant neoplasm of unspecified part of right bronchus or lung: Secondary | ICD-10-CM

## 2014-07-10 DIAGNOSIS — Z5111 Encounter for antineoplastic chemotherapy: Secondary | ICD-10-CM

## 2014-07-10 LAB — CBC WITH DIFFERENTIAL/PLATELET
BASO%: 0.6 % (ref 0.0–2.0)
Basophils Absolute: 0 10*3/uL (ref 0.0–0.1)
EOS%: 1.2 % (ref 0.0–7.0)
Eosinophils Absolute: 0.1 10*3/uL (ref 0.0–0.5)
HCT: 39.4 % (ref 38.4–49.9)
HGB: 12.8 g/dL — ABNORMAL LOW (ref 13.0–17.1)
LYMPH#: 1.1 10*3/uL (ref 0.9–3.3)
LYMPH%: 17.6 % (ref 14.0–49.0)
MCH: 30 pg (ref 27.2–33.4)
MCHC: 32.5 g/dL (ref 32.0–36.0)
MCV: 92.3 fL (ref 79.3–98.0)
MONO#: 0.3 10*3/uL (ref 0.1–0.9)
MONO%: 5.3 % (ref 0.0–14.0)
NEUT#: 4.5 10*3/uL (ref 1.5–6.5)
NEUT%: 75.3 % — AB (ref 39.0–75.0)
Platelets: 277 10*3/uL (ref 140–400)
RBC: 4.27 10*6/uL (ref 4.20–5.82)
RDW: 13.6 % (ref 11.0–14.6)
WBC: 6 10*3/uL (ref 4.0–10.3)

## 2014-07-10 LAB — COMPREHENSIVE METABOLIC PANEL (CC13)
ALK PHOS: 106 U/L (ref 40–150)
ALT: 26 U/L (ref 0–55)
ANION GAP: 10 meq/L (ref 3–11)
AST: 13 U/L (ref 5–34)
Albumin: 3.5 g/dL (ref 3.5–5.0)
BUN: 21 mg/dL (ref 7.0–26.0)
CHLORIDE: 107 meq/L (ref 98–109)
CO2: 22 mEq/L (ref 22–29)
Calcium: 9.6 mg/dL (ref 8.4–10.4)
Creatinine: 1.2 mg/dL (ref 0.7–1.3)
EGFR: 64 mL/min/{1.73_m2} — AB (ref >90)
Glucose: 298 mg/dl — ABNORMAL HIGH (ref 70–140)
POTASSIUM: 4.2 meq/L (ref 3.5–5.1)
SODIUM: 139 meq/L (ref 136–145)
TOTAL PROTEIN: 7.1 g/dL (ref 6.4–8.3)
Total Bilirubin: 0.76 mg/dL (ref 0.20–1.20)

## 2014-07-10 MED ORDER — PACLITAXEL CHEMO INJECTION 300 MG/50ML
45.0000 mg/m2 | Freq: Once | INTRAVENOUS | Status: AC
  Administered 2014-07-10: 72 mg via INTRAVENOUS
  Filled 2014-07-10: qty 12

## 2014-07-10 MED ORDER — SODIUM CHLORIDE 0.9 % IV SOLN
Freq: Once | INTRAVENOUS | Status: AC
Start: 2014-07-10 — End: 2014-07-10
  Administered 2014-07-10: 12:00:00 via INTRAVENOUS

## 2014-07-10 MED ORDER — DIPHENHYDRAMINE HCL 50 MG/ML IJ SOLN
INTRAMUSCULAR | Status: AC
  Filled 2014-07-10: qty 1

## 2014-07-10 MED ORDER — SODIUM CHLORIDE 0.9 % IV SOLN
Freq: Once | INTRAVENOUS | Status: AC
  Administered 2014-07-10: 12:00:00 via INTRAVENOUS
  Filled 2014-07-10: qty 8

## 2014-07-10 MED ORDER — FAMOTIDINE IN NACL 20-0.9 MG/50ML-% IV SOLN
INTRAVENOUS | Status: AC
  Filled 2014-07-10: qty 50

## 2014-07-10 MED ORDER — DIPHENHYDRAMINE HCL 50 MG/ML IJ SOLN
50.0000 mg | Freq: Once | INTRAMUSCULAR | Status: AC
  Administered 2014-07-10: 50 mg via INTRAVENOUS

## 2014-07-10 MED ORDER — SODIUM CHLORIDE 0.9 % IV SOLN
172.6000 mg | Freq: Once | INTRAVENOUS | Status: AC
  Administered 2014-07-10: 170 mg via INTRAVENOUS
  Filled 2014-07-10: qty 17

## 2014-07-10 MED ORDER — FAMOTIDINE IN NACL 20-0.9 MG/50ML-% IV SOLN
20.0000 mg | Freq: Once | INTRAVENOUS | Status: AC
  Administered 2014-07-10: 20 mg via INTRAVENOUS

## 2014-07-10 NOTE — Progress Notes (Addendum)
No images are attached to the encounter. No scans are attached to the encounter. No scans are attached to the encounter. [facility] VISIT PROGRESS NOTE  Adam Warner, [place] [address]  DIAGNOSIS: Non-small cell carcinoma of lung, stage 2   Staging form: Lung, AJCC 7th Edition     Clinical stage from 06/22/2014: Stage IIA (T1a, N1, M0) - Signed by Curt Bears, MD on 06/22/2014  PRIOR THERAPY: none  CURRENT THERAPY: Concurrent chemotherapy radiation with chemotherapy with form of weekly carboplatin for an AUC of 2 and paclitaxel 45 mg/m given concurrent with radiation. The patient's radiation is given in [place] under the care of Dr. Sondra Come  INTERVAL HISTORY: Adam Warner 59 y.o. male returns for a scheduled regular symptom management visit for followup of his recently diagnosed stage II a (T1a, N1, M0) non-small cell lung cancer. He status post 1 week of concurrent chemoradiation. Overall is tolerating his treatment relatively well. He is currently getting his radiation treatments in [place]. He reports occasional chills but no documented fever. He has some issues with constipation but these are long-standing. He states that his normal frequency of bowel movements is a bowel movement 2-3 times per week. He reports having some hiccups after radiation therapy. He will make the radiation attack aware of this so that Dr. Sondra Come can address this and any adjustments that are necessary can be made. He voiced no other specific complaints today. His appetite is normal for him which is not very robust. His weight is relatively stable although it is down about a pound and a half. He is encouraged to increase his by mouth intake both of food and fluids and to add nutritional supplements such as Boost or Ensure at least twice daily.  MEDICAL HISTORY: Past Medical History  Diagnosis Date  . COPD (chronic obstructive pulmonary disease)   .  Stroke   . Hypertension   . Heart attack 1991  . Pulmonary nodule   . Coronary artery disease     sees Dr Spero Curb at [facility] every 2-3 years  . Shortness of breath dyspnea   . Diabetes mellitus     Type Niddm x 2 years  . Full dentures     ALLERGIES:  is allergic to zyban.  MEDICATIONS:  Current Outpatient Prescriptions  Medication Sig Dispense Refill  . ACCU-CHEK AVIVA PLUS test strip USE TO CHECK ONCE OR TWICE DAILY 100 each 0  . ACCU-CHEK FASTCLIX LANCETS MISC Check blood sugar bid and prn 102 each 0  . aspirin EC 325 MG tablet Take 325 mg by mouth every morning. Hold for surgery    . atorvastatin (LIPITOR) 40 MG tablet TAKE ONE TABLET BY MOUTH ONE TIME DAILY 30 tablet 6  . Blood Glucose Monitoring Suppl (ONE TOUCH ULTRA 2) W/DEVICE KIT Use monitor to test blood sugar qd Dx 250.00 1 each 0  . Cholecalciferol (VITAMIN D) 2000 UNITS CAPS Take 1 capsule by mouth daily.    . metFORMIN (GLUCOPHAGE) 500 MG tablet Take 1 tablet (500 mg total) by mouth at bedtime. 30 tablet 6  . methylPREDNISolone (MEDROL DOSEPAK) 4 MG tablet follow package directions 21 tablet 0  . tiotropium (SPIRIVA) 18 MCG inhalation capsule Place 1 capsule (18 mcg total) into inhaler and inhale daily. 30 capsule 6  . valsartan (DIOVAN) 320 MG tablet TAKE ONE TABLET BY MOUTH ONE TIME DAILY 30 tablet 6  . prochlorperazine (COMPAZINE) 10 MG tablet Take  1 tablet (10 mg total) by mouth every 6 (six) hours as needed for nausea or vomiting. (Patient not taking: Reported on 07/10/2014) 30 tablet 0   No current facility-administered medications for this visit.    SURGICAL HISTORY:  Past Surgical History  Procedure Laterality Date  . Lung surgery      Dr Arlyce Dice  . Angioplasty    . Colonoscopy  10/22/2011    Procedure: COLONOSCOPY;  Surgeon: Rogene Houston, MD;  Location: [facility];  Service: Endoscopy;  Laterality: N/A;  1200  . Carotid endarterectomy Right   . Video bronchoscopy with endobronchial ultrasound N/A  05/29/2014    Procedure: VIDEO BRONCHOSCOPY WITH ENDOBRONCHIAL ULTRASOUND;  Surgeon: Melrose Nakayama, MD;  Location: [facility];  Service: Thoracic;  Laterality: N/A;    REVIEW OF SYSTEMS:  Review of Systems  Constitutional: Positive for weight loss. Negative for fever, chills, malaise/fatigue and diaphoresis.  HENT: Negative for congestion, ear discharge, ear pain, hearing loss, nosebleeds, sore throat and tinnitus.   Eyes: Negative for blurred vision, double vision, photophobia, pain, discharge and redness.  Respiratory: Negative for cough, hemoptysis, sputum production, shortness of breath, wheezing and stridor.   Cardiovascular: Negative for chest pain, palpitations, orthopnea, claudication, leg swelling and PND.  Gastrointestinal: Negative for heartburn, nausea, vomiting, abdominal pain, diarrhea, constipation, blood in stool and melena.       Hiccups after radiation treatments  Genitourinary: Negative.   Musculoskeletal: Negative.   Skin: Negative.   Neurological: Negative for dizziness, tingling, focal weakness, seizures, weakness and headaches.  Endo/Heme/Allergies: Does not bruise/bleed easily.  Psychiatric/Behavioral: Negative for depression. The patient is not nervous/anxious and does not have insomnia.      PHYSICAL EXAMINATION: Physical Exam  Constitutional: He is oriented to person, place, and time and well-developed, well-nourished, and in no distress.  HENT:  Head: Normocephalic and atraumatic.  Mouth/Throat: Oropharynx is clear and moist.  Eyes: Pupils are equal, round, and reactive to light.  Neck: Normal range of motion. Neck supple. No JVD present. No tracheal deviation present. No thyromegaly present.  Cardiovascular: Normal rate, regular rhythm, normal heart sounds and intact distal pulses.  Exam reveals no gallop and no friction rub.   No murmur heard. Pulmonary/Chest: Effort normal and breath sounds normal. No respiratory distress. He has no wheezes. He has no  rales.  Abdominal: Soft. Bowel sounds are normal. He exhibits no distension and no mass. There is no tenderness.  Musculoskeletal: Normal range of motion. He exhibits no edema or tenderness.  Lymphadenopathy:    He has no cervical adenopathy.  Neurological: He is alert and oriented to person, place, and time. He has normal reflexes. Gait normal.  Skin: Skin is warm and dry. No rash noted.    ECOG PERFORMANCE STATUS: 1 - Symptomatic but completely ambulatory  Blood pressure 110/69, pulse 110, temperature 98.2 F (36.8 C), temperature source Oral, resp. rate 18, height $RemoveBe'5\' 5"'[id number]$  (1.651 m), weight 119 lb 6.4 oz (54.159 kg), SpO2 99 %.  LABORATORY DATA: Lab Results  Component Value Date   WBC 6.0 07/10/2014   HGB 12.8* 07/10/2014   HCT 39.4 07/10/2014   MCV 92.3 07/10/2014   PLT 277 07/10/2014      Chemistry      Component Value Date/Time   NA 139 07/10/2014 0925   NA 136 05/23/2014 1015   NA 139 04/12/2014 1029   K 4.2 07/10/2014 0925   K 4.1 05/23/2014 1015   CL 106 05/23/2014 1015   CO2 22  07/10/2014 0925   CO2 25 05/23/2014 1015   BUN 21.0 07/10/2014 0925   BUN 10 05/23/2014 1015   BUN 12 04/12/2014 1029   CREATININE 1.2 07/10/2014 0925   CREATININE 1.07 05/23/2014 1015   CREATININE 1.05 11/04/2012 1242      Component Value Date/Time   CALCIUM 9.6 07/10/2014 0925   CALCIUM 9.3 05/23/2014 1015   ALKPHOS 106 07/10/2014 0925   ALKPHOS 90 05/23/2014 1015   AST 13 07/10/2014 0925   AST 19 05/23/2014 1015   ALT 26 07/10/2014 0925   ALT 17 05/23/2014 1015   BILITOT 0.76 07/10/2014 0925   BILITOT 0.6 05/23/2014 1015       RADIOGRAPHIC STUDIES:  No results found.   ASSESSMENT/PLAN:  No problem-specific assessment & plan notes found for this encounter.  patient is a very pleasant 59 year old male recently diagnosed with unresectable stage II a (T1a, N1, M0) non-small cell lung cancer poorly differentiated carcinoma that presented with right upper lobe lung nodule in  addition to right hilar lymphadenopathy. He is currently being treated with a course of concurrent chemoradiation. He is receiving his weekly chemotherapy here in [place] and his radiation therapy in [place], [place]. Thus far he is tolerating his treatment relatively well with the exception of some mild hiccups after radiation therapy. He will make the practitioners in [place] aware of these symptoms. Patient was discussed with and also seen by Dr. Julien Nordmann. He is advised to increase his by mouth intake both of food and fluids and to add a nutritional supplements such as Ensure or Boost at least twice daily. He will continue with this course of concurrent chemoradiation with follow-up in 2 weeks for another symptom management visit.  Awilda Metro E, PA-C 07/10/2014  All questions were answered. The patient knows to call the clinic with any problems, questions or concerns. We can certainly see the patient much sooner if necessary.  ADDENDUM: Hematology/Oncology Attending: I had a face to face encounter with the patient. I recommended his care plan. This is a very pleasant 59 years old white male recently diagnosed with unresectable a stage IIa non-small cell lung cancer. He is currently undergoing a course of concurrent chemoradiation with weekly carboplatin and paclitaxel and tolerated his chemotherapy fairly well. He received his radiation under the care of Dr. Sondra Come in [facility]. He is also tolerating his radiotherapy well except for mild sore throat.  I recommended for the patient to proceed with his treatment with chemotherapy as a scheduled. He was advised to consult with Dr. Sondra Come regarding the sore throat and any swallowing difficulty secondary to radiotherapy. He was also advised to increase his nutritional supplements and fluid intake. The patient would come back for follow-up visit in 2 weeks for reevaluation. He was advised to call immediately if he has any concerning  symptoms in the interval.  Disclaimer: This note was dictated with voice recognition software. Similar sounding words can inadvertently be transcribed and may be missed upon review. Eilleen Kempf., MD 07/10/2014

## 2014-07-10 NOTE — Patient Instructions (Signed)
Continue your course of concurrent chemoradiation as scheduled Follow-up in 2 weeks

## 2014-07-10 NOTE — Patient Instructions (Signed)
[facility] Discharge Instructions for Patients Receiving Chemotherapy  Today you received the following chemotherapy agents:  Taxol and Carboplatin  To help prevent nausea and vomiting after your treatment, we encourage you to take your nausea medication as ordered per MD.   If you develop nausea and vomiting that is not controlled by your nausea medication, call the clinic.   BELOW ARE SYMPTOMS THAT SHOULD BE REPORTED IMMEDIATELY:  *FEVER GREATER THAN 100.5 F  *CHILLS WITH OR WITHOUT FEVER  NAUSEA AND VOMITING THAT IS NOT CONTROLLED WITH YOUR NAUSEA MEDICATION  *UNUSUAL SHORTNESS OF BREATH  *UNUSUAL BRUISING OR BLEEDING  TENDERNESS IN MOUTH AND THROAT WITH OR WITHOUT PRESENCE OF ULCERS  *URINARY PROBLEMS  *BOWEL PROBLEMS  UNUSUAL RASH Items with * indicate a potential emergency and should be followed up as soon as possible.  Feel free to call the clinic you have any questions or concerns. The clinic phone number is [phone].   Please remember to pick up steroids from your pharmacy to improve appetite   Carboplatin injection What is this medicine? CARBOPLATIN (KAR boe pla tin) is a chemotherapy drug. It targets fast dividing cells, like cancer cells, and causes these cells to die. This medicine is used to treat ovarian cancer and many other cancers. This medicine may be used for other purposes; ask your health care provider or pharmacist if you have questions. COMMON BRAND NAME(S): Paraplatin What should I tell my health care provider before I take this medicine? They need to know if you have any of these conditions: -blood disorders -hearing problems -kidney disease -recent or ongoing radiation therapy -an unusual or allergic reaction to carboplatin, cisplatin, other chemotherapy, other medicines, foods, dyes, or preservatives -pregnant or trying to get pregnant -breast-feeding How should I use this medicine? This drug is usually given as an  infusion into a vein. It is administered in a hospital or clinic by a specially trained health care professional. Talk to your pediatrician regarding the use of this medicine in children. Special care may be needed. Overdosage: If you think you have taken too much of this medicine contact a poison control center or emergency room at once. NOTE: This medicine is only for you. Do not share this medicine with others. What if I miss a dose? It is important not to miss a dose. Call your doctor or health care professional if you are unable to keep an appointment. What may interact with this medicine? -medicines for seizures -medicines to increase blood counts like filgrastim, pegfilgrastim, sargramostim -some antibiotics like amikacin, gentamicin, neomycin, streptomycin, tobramycin -vaccines Talk to your doctor or health care professional before taking any of these medicines: -acetaminophen -aspirin -ibuprofen -ketoprofen -naproxen This list may not describe all possible interactions. Give your health care provider a list of all the medicines, herbs, non-prescription drugs, or dietary supplements you use. Also tell them if you smoke, drink alcohol, or use illegal drugs. Some items may interact with your medicine. What should I watch for while using this medicine? Your condition will be monitored carefully while you are receiving this medicine. You will need important blood work done while you are taking this medicine. This drug may make you feel generally unwell. This is not uncommon, as chemotherapy can affect healthy cells as well as cancer cells. Report any side effects. Continue your course of treatment even though you feel ill unless your doctor tells you to stop. In some cases, you may be given additional medicines to help with side  effects. Follow all directions for their use. Call your doctor or health care professional for advice if you get a fever, chills or sore throat, or other symptoms  of a cold or flu. Do not treat yourself. This drug decreases your body's ability to fight infections. Try to avoid being around people who are sick. This medicine may increase your risk to bruise or bleed. Call your doctor or health care professional if you notice any unusual bleeding. Be careful brushing and flossing your teeth or using a toothpick because you may get an infection or bleed more easily. If you have any dental work done, tell your dentist you are receiving this medicine. Avoid taking products that contain aspirin, acetaminophen, ibuprofen, naproxen, or ketoprofen unless instructed by your doctor. These medicines may hide a fever. Do not become pregnant while taking this medicine. Women should inform their doctor if they wish to become pregnant or think they might be pregnant. There is a potential for serious side effects to an unborn child. Talk to your health care professional or pharmacist for more information. Do not breast-feed an infant while taking this medicine. What side effects may I notice from receiving this medicine? Side effects that you should report to your doctor or health care professional as soon as possible: -allergic reactions like skin rash, itching or hives, swelling of the face, lips, or tongue -signs of infection - fever or chills, cough, sore throat, pain or difficulty passing urine -signs of decreased platelets or bleeding - bruising, pinpoint red spots on the skin, black, tarry stools, nosebleeds -signs of decreased red blood cells - unusually weak or tired, fainting spells, lightheadedness -breathing problems -changes in hearing -changes in vision -chest pain -high blood pressure -low blood counts - This drug may decrease the number of white blood cells, red blood cells and platelets. You may be at increased risk for infections and bleeding. -nausea and vomiting -pain, swelling, redness or irritation at the injection site -pain, tingling, numbness in the  hands or feet -problems with balance, talking, walking -trouble passing urine or change in the amount of urine Side effects that usually do not require medical attention (report to your doctor or health care professional if they continue or are bothersome): -hair loss -loss of appetite -metallic taste in the mouth or changes in taste This list may not describe all possible side effects. Call your doctor for medical advice about side effects. You may report side effects to FDA at 1-800-FDA-1088. Where should I keep my medicine? This drug is given in a hospital or clinic and will not be stored at home. NOTE: This sheet is a summary. It may not cover all possible information. If you have questions about this medicine, talk to your doctor, pharmacist, or health care provider.  2015, Elsevier/Gold Standard. (2007-07-13 14:38:05) Paclitaxel injection What is this medicine? PACLITAXEL (PAK li TAX el) is a chemotherapy drug. It targets fast dividing cells, like cancer cells, and causes these cells to die. This medicine is used to treat ovarian cancer, breast cancer, and other cancers. This medicine may be used for other purposes; ask your health care provider or pharmacist if you have questions. COMMON BRAND NAME(S): Onxol, Taxol What should I tell my health care provider before I take this medicine? They need to know if you have any of these conditions: -blood disorders -irregular heartbeat -infection (especially a virus infection such as chickenpox, cold sores, or herpes) -liver disease -previous or ongoing radiation therapy -an unusual or allergic reaction  to paclitaxel, alcohol, polyoxyethylated castor oil, other chemotherapy agents, other medicines, foods, dyes, or preservatives -pregnant or trying to get pregnant -breast-feeding How should I use this medicine? This drug is given as an infusion into a vein. It is administered in a hospital or clinic by a specially trained health care  professional. Talk to your pediatrician regarding the use of this medicine in children. Special care may be needed. Overdosage: If you think you have taken too much of this medicine contact a poison control center or emergency room at once. NOTE: This medicine is only for you. Do not share this medicine with others. What if I miss a dose? It is important not to miss your dose. Call your doctor or health care professional if you are unable to keep an appointment. What may interact with this medicine? Do not take this medicine with any of the following medications: -disulfiram -metronidazole This medicine may also interact with the following medications: -cyclosporine -diazepam -ketoconazole -medicines to increase blood counts like filgrastim, pegfilgrastim, sargramostim -other chemotherapy drugs like cisplatin, doxorubicin, epirubicin, etoposide, teniposide, vincristine -quinidine -testosterone -vaccines -verapamil Talk to your doctor or health care professional before taking any of these medicines: -acetaminophen -aspirin -ibuprofen -ketoprofen -naproxen This list may not describe all possible interactions. Give your health care provider a list of all the medicines, herbs, non-prescription drugs, or dietary supplements you use. Also tell them if you smoke, drink alcohol, or use illegal drugs. Some items may interact with your medicine. What should I watch for while using this medicine? Your condition will be monitored carefully while you are receiving this medicine. You will need important blood work done while you are taking this medicine. This drug may make you feel generally unwell. This is not uncommon, as chemotherapy can affect healthy cells as well as cancer cells. Report any side effects. Continue your course of treatment even though you feel ill unless your doctor tells you to stop. In some cases, you may be given additional medicines to help with side effects. Follow all  directions for their use. Call your doctor or health care professional for advice if you get a fever, chills or sore throat, or other symptoms of a cold or flu. Do not treat yourself. This drug decreases your body's ability to fight infections. Try to avoid being around people who are sick. This medicine may increase your risk to bruise or bleed. Call your doctor or health care professional if you notice any unusual bleeding. Be careful brushing and flossing your teeth or using a toothpick because you may get an infection or bleed more easily. If you have any dental work done, tell your dentist you are receiving this medicine. Avoid taking products that contain aspirin, acetaminophen, ibuprofen, naproxen, or ketoprofen unless instructed by your doctor. These medicines may hide a fever. Do not become pregnant while taking this medicine. Women should inform their doctor if they wish to become pregnant or think they might be pregnant. There is a potential for serious side effects to an unborn child. Talk to your health care professional or pharmacist for more information. Do not breast-feed an infant while taking this medicine. Men are advised not to father a child while receiving this medicine. What side effects may I notice from receiving this medicine? Side effects that you should report to your doctor or health care professional as soon as possible: -allergic reactions like skin rash, itching or hives, swelling of the face, lips, or tongue -low blood counts - This drug  may decrease the number of white blood cells, red blood cells and platelets. You may be at increased risk for infections and bleeding. -signs of infection - fever or chills, cough, sore throat, pain or difficulty passing urine -signs of decreased platelets or bleeding - bruising, pinpoint red spots on the skin, black, tarry stools, nosebleeds -signs of decreased red blood cells - unusually weak or tired, fainting spells,  lightheadedness -breathing problems -chest pain -high or low blood pressure -mouth sores -nausea and vomiting -pain, swelling, redness or irritation at the injection site -pain, tingling, numbness in the hands or feet -slow or irregular heartbeat -swelling of the ankle, feet, hands Side effects that usually do not require medical attention (report to your doctor or health care professional if they continue or are bothersome): -bone pain -complete hair loss including hair on your head, underarms, pubic hair, eyebrows, and eyelashes -changes in the color of fingernails -diarrhea -loosening of the fingernails -loss of appetite -muscle or joint pain -red flush to skin -sweating This list may not describe all possible side effects. Call your doctor for medical advice about side effects. You may report side effects to FDA at 1-800-FDA-1088. Where should I keep my medicine? This drug is given in a hospital or clinic and will not be stored at home. NOTE: This sheet is a summary. It may not cover all possible information. If you have questions about this medicine, talk to your doctor, pharmacist, or health care provider.  2015, Elsevier/Gold Standard. (2012-05-31 16:41:21)

## 2014-07-14 ENCOUNTER — Other Ambulatory Visit: Payer: Self-pay | Admitting: *Deleted

## 2014-07-14 DIAGNOSIS — C3490 Malignant neoplasm of unspecified part of unspecified bronchus or lung: Secondary | ICD-10-CM

## 2014-07-17 ENCOUNTER — Ambulatory Visit (HOSPITAL_BASED_OUTPATIENT_CLINIC_OR_DEPARTMENT_OTHER): Payer: Medicaid Other

## 2014-07-17 ENCOUNTER — Other Ambulatory Visit (HOSPITAL_BASED_OUTPATIENT_CLINIC_OR_DEPARTMENT_OTHER): Payer: Medicaid Other

## 2014-07-17 ENCOUNTER — Other Ambulatory Visit: Payer: Medicaid Other

## 2014-07-17 ENCOUNTER — Ambulatory Visit: Payer: Medicaid Other

## 2014-07-17 DIAGNOSIS — C3490 Malignant neoplasm of unspecified part of unspecified bronchus or lung: Secondary | ICD-10-CM

## 2014-07-17 DIAGNOSIS — C3411 Malignant neoplasm of upper lobe, right bronchus or lung: Secondary | ICD-10-CM

## 2014-07-17 DIAGNOSIS — C3491 Malignant neoplasm of unspecified part of right bronchus or lung: Secondary | ICD-10-CM

## 2014-07-17 DIAGNOSIS — Z5111 Encounter for antineoplastic chemotherapy: Secondary | ICD-10-CM

## 2014-07-17 LAB — CBC WITH DIFFERENTIAL/PLATELET
BASO%: 0.5 % (ref 0.0–2.0)
Basophils Absolute: 0 10*3/uL (ref 0.0–0.1)
EOS ABS: 0 10*3/uL (ref 0.0–0.5)
EOS%: 1 % (ref 0.0–7.0)
HCT: 36.4 % — ABNORMAL LOW (ref 38.4–49.9)
HGB: 12.2 g/dL — ABNORMAL LOW (ref 13.0–17.1)
LYMPH%: 22.5 % (ref 14.0–49.0)
MCH: 30.4 pg (ref 27.2–33.4)
MCHC: 33.5 g/dL (ref 32.0–36.0)
MCV: 90.8 fL (ref 79.3–98.0)
MONO#: 0.3 10*3/uL (ref 0.1–0.9)
MONO%: 7.3 % (ref 0.0–14.0)
NEUT%: 68.7 % (ref 39.0–75.0)
NEUTROS ABS: 2.6 10*3/uL (ref 1.5–6.5)
Platelets: 181 10*3/uL (ref 140–400)
RBC: 4.01 10*6/uL — AB (ref 4.20–5.82)
RDW: 13.8 % (ref 11.0–14.6)
WBC: 3.8 10*3/uL — ABNORMAL LOW (ref 4.0–10.3)
lymph#: 0.9 10*3/uL (ref 0.9–3.3)

## 2014-07-17 LAB — COMPREHENSIVE METABOLIC PANEL (CC13)
ALBUMIN: 3.4 g/dL — AB (ref 3.5–5.0)
ALT: 35 U/L (ref 0–55)
ANION GAP: 11 meq/L (ref 3–11)
AST: 19 U/L (ref 5–34)
Alkaline Phosphatase: 100 U/L (ref 40–150)
BUN: 13.4 mg/dL (ref 7.0–26.0)
CALCIUM: 9.1 mg/dL (ref 8.4–10.4)
CHLORIDE: 109 meq/L (ref 98–109)
CO2: 23 meq/L (ref 22–29)
CREATININE: 0.9 mg/dL (ref 0.7–1.3)
EGFR: 87 mL/min/{1.73_m2} — ABNORMAL LOW (ref >90)
GLUCOSE: 251 mg/dL — AB (ref 70–140)
Potassium: 4.1 mEq/L (ref 3.5–5.1)
Sodium: 142 mEq/L (ref 136–145)
Total Bilirubin: 0.52 mg/dL (ref 0.20–1.20)
Total Protein: 6.7 g/dL (ref 6.4–8.3)

## 2014-07-17 MED ORDER — DIPHENHYDRAMINE HCL 50 MG/ML IJ SOLN
INTRAMUSCULAR | Status: AC
  Filled 2014-07-17: qty 1

## 2014-07-17 MED ORDER — FAMOTIDINE IN NACL 20-0.9 MG/50ML-% IV SOLN
INTRAVENOUS | Status: AC
  Filled 2014-07-17: qty 50

## 2014-07-17 MED ORDER — FAMOTIDINE IN NACL 20-0.9 MG/50ML-% IV SOLN
20.0000 mg | Freq: Once | INTRAVENOUS | Status: AC
  Administered 2014-07-17: 20 mg via INTRAVENOUS

## 2014-07-17 MED ORDER — SODIUM CHLORIDE 0.9 % IV SOLN
172.6000 mg | Freq: Once | INTRAVENOUS | Status: AC
  Administered 2014-07-17: 170 mg via INTRAVENOUS
  Filled 2014-07-17: qty 17

## 2014-07-17 MED ORDER — SODIUM CHLORIDE 0.9 % IV SOLN
Freq: Once | INTRAVENOUS | Status: AC
  Administered 2014-07-17: 10:00:00 via INTRAVENOUS

## 2014-07-17 MED ORDER — PACLITAXEL CHEMO INJECTION 300 MG/50ML
45.0000 mg/m2 | Freq: Once | INTRAVENOUS | Status: AC
  Administered 2014-07-17: 72 mg via INTRAVENOUS
  Filled 2014-07-17: qty 12

## 2014-07-17 MED ORDER — DIPHENHYDRAMINE HCL 50 MG/ML IJ SOLN
50.0000 mg | Freq: Once | INTRAMUSCULAR | Status: AC
  Administered 2014-07-17: 50 mg via INTRAVENOUS

## 2014-07-17 MED ORDER — SODIUM CHLORIDE 0.9 % IV SOLN
Freq: Once | INTRAVENOUS | Status: AC
  Administered 2014-07-17: 10:00:00 via INTRAVENOUS
  Filled 2014-07-17: qty 8

## 2014-07-17 NOTE — Patient Instructions (Signed)
[facility] Discharge Instructions for Patients Receiving Chemotherapy  Today you received the following chemotherapy agents taxol/carboplatin  To help prevent nausea and vomiting after your treatment, we encourage you to take your nausea medication as directed   If you develop nausea and vomiting that is not controlled by your nausea medication, call the clinic.   BELOW ARE SYMPTOMS THAT SHOULD BE REPORTED IMMEDIATELY:  *FEVER GREATER THAN 100.5 F  *CHILLS WITH OR WITHOUT FEVER  NAUSEA AND VOMITING THAT IS NOT CONTROLLED WITH YOUR NAUSEA MEDICATION  *UNUSUAL SHORTNESS OF BREATH  *UNUSUAL BRUISING OR BLEEDING  TENDERNESS IN MOUTH AND THROAT WITH OR WITHOUT PRESENCE OF ULCERS  *URINARY PROBLEMS  *BOWEL PROBLEMS  UNUSUAL RASH Items with * indicate a potential emergency and should be followed up as soon as possible.  Feel free to call the clinic you have any questions or concerns. The clinic phone number is [phone].  

## 2014-07-24 ENCOUNTER — Ambulatory Visit (HOSPITAL_BASED_OUTPATIENT_CLINIC_OR_DEPARTMENT_OTHER): Payer: Medicaid Other

## 2014-07-24 ENCOUNTER — Encounter: Payer: Self-pay | Admitting: *Deleted

## 2014-07-24 ENCOUNTER — Ambulatory Visit: Payer: Medicaid Other

## 2014-07-24 ENCOUNTER — Other Ambulatory Visit (HOSPITAL_BASED_OUTPATIENT_CLINIC_OR_DEPARTMENT_OTHER): Payer: Medicaid Other

## 2014-07-24 ENCOUNTER — Other Ambulatory Visit: Payer: Medicaid Other

## 2014-07-24 DIAGNOSIS — Z5111 Encounter for antineoplastic chemotherapy: Secondary | ICD-10-CM

## 2014-07-24 DIAGNOSIS — C3411 Malignant neoplasm of upper lobe, right bronchus or lung: Secondary | ICD-10-CM | POA: Diagnosis present

## 2014-07-24 DIAGNOSIS — C3491 Malignant neoplasm of unspecified part of right bronchus or lung: Secondary | ICD-10-CM

## 2014-07-24 LAB — COMPREHENSIVE METABOLIC PANEL (CC13)
ALBUMIN: 3.4 g/dL — AB (ref 3.5–5.0)
ALK PHOS: 100 U/L (ref 40–150)
ALT: 30 U/L (ref 0–55)
AST: 19 U/L (ref 5–34)
Anion Gap: 12 mEq/L — ABNORMAL HIGH (ref 3–11)
BUN: 12 mg/dL (ref 7.0–26.0)
CO2: 20 mEq/L — ABNORMAL LOW (ref 22–29)
Calcium: 9.3 mg/dL (ref 8.4–10.4)
Chloride: 109 mEq/L (ref 98–109)
Creatinine: 1 mg/dL (ref 0.7–1.3)
EGFR: 84 mL/min/{1.73_m2} — ABNORMAL LOW (ref >90)
Glucose: 270 mg/dl — ABNORMAL HIGH (ref 70–140)
Potassium: 4.1 mEq/L (ref 3.5–5.1)
SODIUM: 140 meq/L (ref 136–145)
TOTAL PROTEIN: 6.8 g/dL (ref 6.4–8.3)
Total Bilirubin: 0.47 mg/dL (ref 0.20–1.20)

## 2014-07-24 LAB — CBC WITH DIFFERENTIAL/PLATELET
BASO%: 0.7 % (ref 0.0–2.0)
BASOS ABS: 0 10*3/uL (ref 0.0–0.1)
EOS ABS: 0 10*3/uL (ref 0.0–0.5)
EOS%: 0.8 % (ref 0.0–7.0)
HCT: 37.3 % — ABNORMAL LOW (ref 38.4–49.9)
HEMOGLOBIN: 12.4 g/dL — AB (ref 13.0–17.1)
LYMPH%: 18.4 % (ref 14.0–49.0)
MCH: 30.2 pg (ref 27.2–33.4)
MCHC: 33.1 g/dL (ref 32.0–36.0)
MCV: 91.2 fL (ref 79.3–98.0)
MONO#: 0.3 10*3/uL (ref 0.1–0.9)
MONO%: 7.9 % (ref 0.0–14.0)
NEUT%: 72.2 % (ref 39.0–75.0)
NEUTROS ABS: 2.4 10*3/uL (ref 1.5–6.5)
Platelets: 209 10*3/uL (ref 140–400)
RBC: 4.09 10*6/uL — ABNORMAL LOW (ref 4.20–5.82)
RDW: 14.2 % (ref 11.0–14.6)
WBC: 3.3 10*3/uL — ABNORMAL LOW (ref 4.0–10.3)
lymph#: 0.6 10*3/uL — ABNORMAL LOW (ref 0.9–3.3)

## 2014-07-24 MED ORDER — SODIUM CHLORIDE 0.9 % IV SOLN
172.6000 mg | Freq: Once | INTRAVENOUS | Status: AC
  Administered 2014-07-24: 170 mg via INTRAVENOUS
  Filled 2014-07-24: qty 17

## 2014-07-24 MED ORDER — PACLITAXEL CHEMO INJECTION 300 MG/50ML
45.0000 mg/m2 | Freq: Once | INTRAVENOUS | Status: AC
  Administered 2014-07-24: 72 mg via INTRAVENOUS
  Filled 2014-07-24: qty 12

## 2014-07-24 MED ORDER — DIPHENHYDRAMINE HCL 50 MG/ML IJ SOLN
50.0000 mg | Freq: Once | INTRAMUSCULAR | Status: AC
  Administered 2014-07-24: 50 mg via INTRAVENOUS

## 2014-07-24 MED ORDER — SODIUM CHLORIDE 0.9 % IV SOLN
Freq: Once | INTRAVENOUS | Status: AC
  Administered 2014-07-24: 10:00:00 via INTRAVENOUS

## 2014-07-24 MED ORDER — FAMOTIDINE IN NACL 20-0.9 MG/50ML-% IV SOLN
INTRAVENOUS | Status: AC
  Filled 2014-07-24: qty 50

## 2014-07-24 MED ORDER — DIPHENHYDRAMINE HCL 50 MG/ML IJ SOLN
INTRAMUSCULAR | Status: AC
Start: 2014-07-24 — End: 2014-07-24
  Filled 2014-07-24: qty 1

## 2014-07-24 MED ORDER — FAMOTIDINE IN NACL 20-0.9 MG/50ML-% IV SOLN
20.0000 mg | Freq: Once | INTRAVENOUS | Status: AC
  Administered 2014-07-24: 20 mg via INTRAVENOUS

## 2014-07-24 MED ORDER — SODIUM CHLORIDE 0.9 % IV SOLN
Freq: Once | INTRAVENOUS | Status: AC
  Administered 2014-07-24: 10:00:00 via INTRAVENOUS
  Filled 2014-07-24: qty 8

## 2014-07-24 NOTE — [facility] Oncology Navigator Note (Unsigned)
Spoke with patient today during chemo.  He stated he is doing well.  No needs identified at this time.

## 2014-07-24 NOTE — Patient Instructions (Signed)
[place] Discharge Instructions for Patients Receiving Chemotherapy  Today you received the following chemotherapy agents Taxol and Carboplatin  To help prevent nausea and vomiting after your treatment, we encourage you to take your nausea medication Compazine 10 mg every 6 hours as needed.   If you develop nausea and vomiting that is not controlled by your nausea medication, call the clinic.   BELOW ARE SYMPTOMS THAT SHOULD BE REPORTED IMMEDIATELY:  *FEVER GREATER THAN 100.5 F  *CHILLS WITH OR WITHOUT FEVER  NAUSEA AND VOMITING THAT IS NOT CONTROLLED WITH YOUR NAUSEA MEDICATION  *UNUSUAL SHORTNESS OF BREATH  *UNUSUAL BRUISING OR BLEEDING  TENDERNESS IN MOUTH AND THROAT WITH OR WITHOUT PRESENCE OF ULCERS  *URINARY PROBLEMS  *BOWEL PROBLEMS  UNUSUAL RASH Items with * indicate a potential emergency and should be followed up as soon as possible.  Feel free to call the clinic you have any questions or concerns. The clinic phone number is [phone].  Please show the [facility] at check-in to the Emergency Department and triage nurse.

## 2014-07-27 ENCOUNTER — Encounter: Payer: Self-pay | Admitting: *Deleted

## 2014-07-27 DIAGNOSIS — C3490 Malignant neoplasm of unspecified part of unspecified bronchus or lung: Secondary | ICD-10-CM

## 2014-07-31 ENCOUNTER — Ambulatory Visit (HOSPITAL_BASED_OUTPATIENT_CLINIC_OR_DEPARTMENT_OTHER): Payer: Medicaid Other

## 2014-07-31 ENCOUNTER — Other Ambulatory Visit (HOSPITAL_BASED_OUTPATIENT_CLINIC_OR_DEPARTMENT_OTHER): Payer: Medicaid Other

## 2014-07-31 ENCOUNTER — Other Ambulatory Visit: Payer: Medicaid Other

## 2014-07-31 ENCOUNTER — Ambulatory Visit: Payer: Medicaid Other

## 2014-07-31 DIAGNOSIS — C3411 Malignant neoplasm of upper lobe, right bronchus or lung: Secondary | ICD-10-CM

## 2014-07-31 DIAGNOSIS — Z5111 Encounter for antineoplastic chemotherapy: Secondary | ICD-10-CM

## 2014-07-31 DIAGNOSIS — C3491 Malignant neoplasm of unspecified part of right bronchus or lung: Secondary | ICD-10-CM

## 2014-07-31 LAB — CBC WITH DIFFERENTIAL/PLATELET
BASO%: 0.8 % (ref 0.0–2.0)
Basophils Absolute: 0 10*3/uL (ref 0.0–0.1)
EOS%: 1.3 % (ref 0.0–7.0)
Eosinophils Absolute: 0 10*3/uL (ref 0.0–0.5)
HCT: 35.8 % — ABNORMAL LOW (ref 38.4–49.9)
HGB: 11.9 g/dL — ABNORMAL LOW (ref 13.0–17.1)
LYMPH#: 0.5 10*3/uL — AB (ref 0.9–3.3)
LYMPH%: 16.2 % (ref 14.0–49.0)
MCH: 30.5 pg (ref 27.2–33.4)
MCHC: 33.1 g/dL (ref 32.0–36.0)
MCV: 92 fL (ref 79.3–98.0)
MONO#: 0.2 10*3/uL (ref 0.1–0.9)
MONO%: 6.4 % (ref 0.0–14.0)
NEUT%: 75.3 % — ABNORMAL HIGH (ref 39.0–75.0)
NEUTROS ABS: 2.2 10*3/uL (ref 1.5–6.5)
PLATELETS: 198 10*3/uL (ref 140–400)
RBC: 3.9 10*6/uL — AB (ref 4.20–5.82)
RDW: 14.9 % — AB (ref 11.0–14.6)
WBC: 2.9 10*3/uL — AB (ref 4.0–10.3)

## 2014-07-31 LAB — COMPREHENSIVE METABOLIC PANEL (CC13)
ALBUMIN: 3.6 g/dL (ref 3.5–5.0)
ALT: 25 U/L (ref 0–55)
AST: 16 U/L (ref 5–34)
Alkaline Phosphatase: 101 U/L (ref 40–150)
Anion Gap: 13 mEq/L — ABNORMAL HIGH (ref 3–11)
BILIRUBIN TOTAL: 0.46 mg/dL (ref 0.20–1.20)
BUN: 13.3 mg/dL (ref 7.0–26.0)
CO2: 19 meq/L — AB (ref 22–29)
Calcium: 9 mg/dL (ref 8.4–10.4)
Chloride: 110 mEq/L — ABNORMAL HIGH (ref 98–109)
Creatinine: 1 mg/dL (ref 0.7–1.3)
EGFR: 82 mL/min/{1.73_m2} — ABNORMAL LOW (ref >90)
GLUCOSE: 223 mg/dL — AB (ref 70–140)
Potassium: 4.1 mEq/L (ref 3.5–5.1)
Sodium: 143 mEq/L (ref 136–145)
Total Protein: 7 g/dL (ref 6.4–8.3)

## 2014-07-31 MED ORDER — FAMOTIDINE IN NACL 20-0.9 MG/50ML-% IV SOLN
INTRAVENOUS | Status: AC
  Filled 2014-07-31: qty 50

## 2014-07-31 MED ORDER — FAMOTIDINE IN NACL 20-0.9 MG/50ML-% IV SOLN
20.0000 mg | Freq: Once | INTRAVENOUS | Status: AC
  Administered 2014-07-31: 20 mg via INTRAVENOUS

## 2014-07-31 MED ORDER — DIPHENHYDRAMINE HCL 50 MG/ML IJ SOLN
50.0000 mg | Freq: Once | INTRAMUSCULAR | Status: AC
  Administered 2014-07-31: 50 mg via INTRAVENOUS

## 2014-07-31 MED ORDER — DIPHENHYDRAMINE HCL 50 MG/ML IJ SOLN
INTRAMUSCULAR | Status: AC
  Filled 2014-07-31: qty 1

## 2014-07-31 MED ORDER — SODIUM CHLORIDE 0.9 % IV SOLN
Freq: Once | INTRAVENOUS | Status: AC
  Administered 2014-07-31: 10:00:00 via INTRAVENOUS
  Filled 2014-07-31: qty 8

## 2014-07-31 MED ORDER — PACLITAXEL CHEMO INJECTION 300 MG/50ML
45.0000 mg/m2 | Freq: Once | INTRAVENOUS | Status: AC
  Administered 2014-07-31: 72 mg via INTRAVENOUS
  Filled 2014-07-31: qty 12

## 2014-07-31 MED ORDER — SODIUM CHLORIDE 0.9 % IV SOLN
172.6000 mg | Freq: Once | INTRAVENOUS | Status: AC
  Administered 2014-07-31: 170 mg via INTRAVENOUS
  Filled 2014-07-31: qty 17

## 2014-07-31 MED ORDER — SODIUM CHLORIDE 0.9 % IV SOLN
Freq: Once | INTRAVENOUS | Status: AC
  Administered 2014-07-31: 10:00:00 via INTRAVENOUS

## 2014-07-31 NOTE — Patient Instructions (Signed)
[facility] Discharge Instructions for Patients Receiving Chemotherapy  Today you received the following chemotherapy agents: taxol, carboplatin   To help prevent nausea and vomiting after your treatment, we encourage you to take your nausea medication.  Take it as often as prescribed.     If you develop nausea and vomiting that is not controlled by your nausea medication, call the clinic. If it is after clinic hours your family physician or the after hours number for the clinic or go to the Emergency Department.   BELOW ARE SYMPTOMS THAT SHOULD BE REPORTED IMMEDIATELY:  *FEVER GREATER THAN 100.5 F  *CHILLS WITH OR WITHOUT FEVER  NAUSEA AND VOMITING THAT IS NOT CONTROLLED WITH YOUR NAUSEA MEDICATION  *UNUSUAL SHORTNESS OF BREATH  *UNUSUAL BRUISING OR BLEEDING  TENDERNESS IN MOUTH AND THROAT WITH OR WITHOUT PRESENCE OF ULCERS  *URINARY PROBLEMS  *BOWEL PROBLEMS  UNUSUAL RASH Items with * indicate a potential emergency and should be followed up as soon as possible.  Feel free to call the clinic you have any questions or concerns. The clinic phone number is [phone].   I have been informed and understand all the instructions given to me. I know to contact the clinic, my physician, or go to the Emergency Department if any problems should occur. I do not have any questions at this time, but understand that I may call the clinic during office hours   should I have any questions or need assistance in obtaining follow up care.    __________________________________________  _____________  __________ Signature of Patient or Authorized Representative            Date                   Time    __________________________________________ Nurse's Signature

## 2014-08-07 ENCOUNTER — Ambulatory Visit: Payer: Medicaid Other

## 2014-08-07 ENCOUNTER — Ambulatory Visit (HOSPITAL_BASED_OUTPATIENT_CLINIC_OR_DEPARTMENT_OTHER): Payer: Medicaid Other

## 2014-08-07 ENCOUNTER — Other Ambulatory Visit (HOSPITAL_BASED_OUTPATIENT_CLINIC_OR_DEPARTMENT_OTHER): Payer: Medicaid Other

## 2014-08-07 ENCOUNTER — Other Ambulatory Visit: Payer: Medicaid Other

## 2014-08-07 VITALS — BP 99/72 | HR 105 | Temp 97.8°F

## 2014-08-07 DIAGNOSIS — C3491 Malignant neoplasm of unspecified part of right bronchus or lung: Secondary | ICD-10-CM

## 2014-08-07 DIAGNOSIS — Z5111 Encounter for antineoplastic chemotherapy: Secondary | ICD-10-CM

## 2014-08-07 DIAGNOSIS — C3411 Malignant neoplasm of upper lobe, right bronchus or lung: Secondary | ICD-10-CM

## 2014-08-07 DIAGNOSIS — C3490 Malignant neoplasm of unspecified part of unspecified bronchus or lung: Secondary | ICD-10-CM

## 2014-08-07 LAB — COMPREHENSIVE METABOLIC PANEL (CC13)
ALBUMIN: 3.4 g/dL — AB (ref 3.5–5.0)
ALT: 25 U/L (ref 0–55)
ANION GAP: 12 meq/L — AB (ref 3–11)
AST: 18 U/L (ref 5–34)
Alkaline Phosphatase: 92 U/L (ref 40–150)
BUN: 11 mg/dL (ref 7.0–26.0)
CO2: 20 meq/L — AB (ref 22–29)
Calcium: 9.3 mg/dL (ref 8.4–10.4)
Chloride: 106 mEq/L (ref 98–109)
Creatinine: 0.9 mg/dL (ref 0.7–1.3)
EGFR: 90 mL/min/{1.73_m2} — AB (ref >90)
GLUCOSE: 238 mg/dL — AB (ref 70–140)
Potassium: 3.9 mEq/L (ref 3.5–5.1)
Sodium: 139 mEq/L (ref 136–145)
TOTAL PROTEIN: 6.7 g/dL (ref 6.4–8.3)
Total Bilirubin: 0.44 mg/dL (ref 0.20–1.20)

## 2014-08-07 LAB — CBC WITH DIFFERENTIAL/PLATELET
BASO%: 0.6 % (ref 0.0–2.0)
BASOS ABS: 0 10*3/uL (ref 0.0–0.1)
EOS ABS: 0 10*3/uL (ref 0.0–0.5)
EOS%: 0.9 % (ref 0.0–7.0)
HEMATOCRIT: 36 % — AB (ref 38.4–49.9)
HGB: 11.8 g/dL — ABNORMAL LOW (ref 13.0–17.1)
LYMPH#: 0.4 10*3/uL — AB (ref 0.9–3.3)
LYMPH%: 11.8 % — AB (ref 14.0–49.0)
MCH: 30.3 pg (ref 27.2–33.4)
MCHC: 32.8 g/dL (ref 32.0–36.0)
MCV: 92.5 fL (ref 79.3–98.0)
MONO#: 0.2 10*3/uL (ref 0.1–0.9)
MONO%: 7.1 % (ref 0.0–14.0)
NEUT%: 79.6 % — AB (ref 39.0–75.0)
NEUTROS ABS: 2.6 10*3/uL (ref 1.5–6.5)
Platelets: 184 10*3/uL (ref 140–400)
RBC: 3.89 10*6/uL — ABNORMAL LOW (ref 4.20–5.82)
RDW: 15.8 % — ABNORMAL HIGH (ref 11.0–14.6)
WBC: 3.2 10*3/uL — AB (ref 4.0–10.3)

## 2014-08-07 MED ORDER — SODIUM CHLORIDE 0.9 % IV SOLN
Freq: Once | INTRAVENOUS | Status: AC
  Administered 2014-08-07: 10:00:00 via INTRAVENOUS

## 2014-08-07 MED ORDER — FAMOTIDINE IN NACL 20-0.9 MG/50ML-% IV SOLN
INTRAVENOUS | Status: AC
  Filled 2014-08-07: qty 50

## 2014-08-07 MED ORDER — CARBOPLATIN CHEMO INJECTION 450 MG/45ML
172.6000 mg | Freq: Once | INTRAVENOUS | Status: AC
  Administered 2014-08-07: 170 mg via INTRAVENOUS
  Filled 2014-08-07: qty 17

## 2014-08-07 MED ORDER — DIPHENHYDRAMINE HCL 50 MG/ML IJ SOLN
INTRAMUSCULAR | Status: AC
  Filled 2014-08-07: qty 1

## 2014-08-07 MED ORDER — DIPHENHYDRAMINE HCL 50 MG/ML IJ SOLN
50.0000 mg | Freq: Once | INTRAMUSCULAR | Status: AC
  Administered 2014-08-07: 50 mg via INTRAVENOUS

## 2014-08-07 MED ORDER — DEXTROSE 5 % IV SOLN
45.0000 mg/m2 | Freq: Once | INTRAVENOUS | Status: AC
  Administered 2014-08-07: 72 mg via INTRAVENOUS
  Filled 2014-08-07: qty 12

## 2014-08-07 MED ORDER — SODIUM CHLORIDE 0.9 % IV SOLN
Freq: Once | INTRAVENOUS | Status: AC
  Administered 2014-08-07: 10:00:00 via INTRAVENOUS
  Filled 2014-08-07: qty 8

## 2014-08-07 MED ORDER — FAMOTIDINE IN NACL 20-0.9 MG/50ML-% IV SOLN
20.0000 mg | Freq: Once | INTRAVENOUS | Status: AC
  Administered 2014-08-07: 20 mg via INTRAVENOUS

## 2014-08-07 NOTE — Patient Instructions (Signed)
[facility] Discharge Instructions for Patients Receiving Chemotherapy  Today you received the following chemotherapy agents taxol/carboplatin  To help prevent nausea and vomiting after your treatment, we encourage you to take your nausea medication as directed   If you develop nausea and vomiting that is not controlled by your nausea medication, call the clinic.   BELOW ARE SYMPTOMS THAT SHOULD BE REPORTED IMMEDIATELY:  *FEVER GREATER THAN 100.5 F  *CHILLS WITH OR WITHOUT FEVER  NAUSEA AND VOMITING THAT IS NOT CONTROLLED WITH YOUR NAUSEA MEDICATION  *UNUSUAL SHORTNESS OF BREATH  *UNUSUAL BRUISING OR BLEEDING  TENDERNESS IN MOUTH AND THROAT WITH OR WITHOUT PRESENCE OF ULCERS  *URINARY PROBLEMS  *BOWEL PROBLEMS  UNUSUAL RASH Items with * indicate a potential emergency and should be followed up as soon as possible.  Feel free to call the clinic you have any questions or concerns. The clinic phone number is [phone].  

## 2014-08-08 ENCOUNTER — Encounter: Payer: Self-pay | Admitting: *Deleted

## 2014-08-08 ENCOUNTER — Other Ambulatory Visit: Payer: Self-pay | Admitting: Family Medicine

## 2014-08-08 DIAGNOSIS — C3490 Malignant neoplasm of unspecified part of unspecified bronchus or lung: Secondary | ICD-10-CM

## 2014-08-09 ENCOUNTER — Telehealth: Payer: Self-pay | Admitting: Internal Medicine

## 2014-08-09 NOTE — Telephone Encounter (Signed)
s.w. pt wife and advised on appt....ok and aware

## 2014-08-14 ENCOUNTER — Encounter: Payer: Self-pay | Admitting: Oncology

## 2014-08-14 ENCOUNTER — Other Ambulatory Visit (HOSPITAL_BASED_OUTPATIENT_CLINIC_OR_DEPARTMENT_OTHER): Payer: Medicaid Other

## 2014-08-14 ENCOUNTER — Telehealth: Payer: Self-pay | Admitting: Oncology

## 2014-08-14 ENCOUNTER — Ambulatory Visit (HOSPITAL_BASED_OUTPATIENT_CLINIC_OR_DEPARTMENT_OTHER): Payer: Medicaid Other | Admitting: Oncology

## 2014-08-14 VITALS — BP 95/65 | HR 114 | Temp 98.2°F | Resp 18 | Ht 65.0 in | Wt 119.4 lb

## 2014-08-14 DIAGNOSIS — C3411 Malignant neoplasm of upper lobe, right bronchus or lung: Secondary | ICD-10-CM | POA: Diagnosis not present

## 2014-08-14 DIAGNOSIS — C3491 Malignant neoplasm of unspecified part of right bronchus or lung: Secondary | ICD-10-CM

## 2014-08-14 DIAGNOSIS — C3490 Malignant neoplasm of unspecified part of unspecified bronchus or lung: Secondary | ICD-10-CM

## 2014-08-14 LAB — COMPREHENSIVE METABOLIC PANEL (CC13)
ALK PHOS: 81 U/L (ref 40–150)
ALT: 25 U/L (ref 0–55)
AST: 21 U/L (ref 5–34)
Albumin: 3.3 g/dL — ABNORMAL LOW (ref 3.5–5.0)
Anion Gap: 15 mEq/L — ABNORMAL HIGH (ref 3–11)
BUN: 13 mg/dL (ref 7.0–26.0)
CALCIUM: 8.2 mg/dL — AB (ref 8.4–10.4)
CO2: 19 mEq/L — ABNORMAL LOW (ref 22–29)
CREATININE: 1.1 mg/dL (ref 0.7–1.3)
Chloride: 107 mEq/L (ref 98–109)
EGFR: 73 mL/min/{1.73_m2} — AB (ref >90)
Glucose: 227 mg/dl — ABNORMAL HIGH (ref 70–140)
Potassium: 4 mEq/L (ref 3.5–5.1)
Sodium: 141 mEq/L (ref 136–145)
Total Bilirubin: 0.35 mg/dL (ref 0.20–1.20)
Total Protein: 6.2 g/dL — ABNORMAL LOW (ref 6.4–8.3)

## 2014-08-14 LAB — CBC WITH DIFFERENTIAL/PLATELET
BASO%: 0.3 % (ref 0.0–2.0)
Basophils Absolute: 0 10*3/uL (ref 0.0–0.1)
EOS%: 1.3 % (ref 0.0–7.0)
Eosinophils Absolute: 0 10*3/uL (ref 0.0–0.5)
HEMATOCRIT: 30.6 % — AB (ref 38.4–49.9)
HGB: 10.6 g/dL — ABNORMAL LOW (ref 13.0–17.1)
LYMPH%: 14.1 % (ref 14.0–49.0)
MCH: 31.5 pg (ref 27.2–33.4)
MCHC: 34.6 g/dL (ref 32.0–36.0)
MCV: 90.8 fL (ref 79.3–98.0)
MONO#: 0.3 10*3/uL (ref 0.1–0.9)
MONO%: 8.7 % (ref 0.0–14.0)
NEUT#: 2.4 10*3/uL (ref 1.5–6.5)
NEUT%: 75.6 % — ABNORMAL HIGH (ref 39.0–75.0)
PLATELETS: 145 10*3/uL (ref 140–400)
RBC: 3.37 10*6/uL — ABNORMAL LOW (ref 4.20–5.82)
RDW: 15.6 % — AB (ref 11.0–14.6)
WBC: 3.1 10*3/uL — AB (ref 4.0–10.3)
lymph#: 0.4 10*3/uL — ABNORMAL LOW (ref 0.9–3.3)

## 2014-08-14 NOTE — Telephone Encounter (Signed)
Gave avs & calendar for May. °

## 2014-08-14 NOTE — Progress Notes (Signed)
No images are attached to the encounter. No scans are attached to the encounter. No scans are attached to the encounter. [facility] VISIT PROGRESS NOTE  [name], [place] [address]  DIAGNOSIS: Non-small cell carcinoma of lung, stage 2   Staging form: Lung, AJCC 7th Edition     Clinical stage from 06/22/2014: Stage IIA (T1a, N1, M0) - Signed by Curt Bears, MD on 06/22/2014  PRIOR THERAPY: none  CURRENT THERAPY: Concurrent chemotherapy radiation with chemotherapy with form of weekly carboplatin for an AUC of 2 and paclitaxel 45 mg/m given concurrent with radiation. The patient's radiation is given in [facility] under the care of Dr. Sondra Come.   INTERVAL HISTORY: Adam Warner 59 y.o. male returns for a scheduled regular symptom management visit for followup of his recently diagnosed stage II a (T1a, N1, M0) non-small cell lung cancer. The patient received concurrent chemotherapy with radiation. He completed 6 weeks of chemotherapy on 08/07/2014 and completed his radiation on 08/09/2014. He tolerated his treatment well. He voiced no specific complaints today. He denies fevers, chills, night sweats. He denies chest pain, shortness of breath, dyspnea on exertion. Reports occasional nonproductive cough. Denies abdominal pain, nausea, or vomiting. His appetite is normal for him which is not very robust. His weight is relatively stable.   MEDICAL HISTORY: Past Medical History  Diagnosis Date  . COPD (chronic obstructive pulmonary disease)   . Stroke   . Hypertension   . Heart attack 1991  . Pulmonary nodule   . Coronary artery disease     sees Dr Spero Curb at [facility] every 2-3 years  . Shortness of breath dyspnea   . Diabetes mellitus     Type Niddm x 2 years  . Full dentures     ALLERGIES:  is allergic to zyban.  MEDICATIONS:  Current Outpatient Prescriptions  Medication Sig Dispense Refill  . ACCU-CHEK AVIVA PLUS test strip USE  TO CHECK ONCE OR TWICE DAILY 100 each 0  . ACCU-CHEK FASTCLIX LANCETS MISC Check blood sugar bid and prn 102 each 0  . aspirin EC 325 MG tablet Take 325 mg by mouth every morning. Hold for surgery    . atorvastatin (LIPITOR) 40 MG tablet TAKE ONE TABLET BY MOUTH ONE TIME DAILY 30 tablet 6  . Blood Glucose Monitoring Suppl (ONE TOUCH ULTRA 2) W/DEVICE KIT Use monitor to test blood sugar qd Dx 250.00 1 each 0  . Cholecalciferol (VITAMIN D) 2000 UNITS CAPS Take 1 capsule by mouth daily.    Marland Kitchen DIOVAN 320 MG tablet TAKE ONE TABLET BY MOUTH ONE TIME DAILY 30 tablet 0  . metFORMIN (GLUCOPHAGE) 500 MG tablet Take 1 tablet (500 mg total) by mouth at bedtime. 30 tablet 6  . methylPREDNISolone (MEDROL DOSEPAK) 4 MG tablet follow package directions 21 tablet 0  . prochlorperazine (COMPAZINE) 10 MG tablet Take 1 tablet (10 mg total) by mouth every 6 (six) hours as needed for nausea or vomiting. 30 tablet 0  . tiotropium (SPIRIVA) 18 MCG inhalation capsule Place 1 capsule (18 mcg total) into inhaler and inhale daily. 30 capsule 6   No current facility-administered medications for this visit.    SURGICAL HISTORY:  Past Surgical History  Procedure Laterality Date  . Lung surgery      Dr Arlyce Dice  . Angioplasty    . Colonoscopy  10/22/2011    Procedure: COLONOSCOPY;  Surgeon: Rogene Houston, MD;  Location: [facility];  Service: Endoscopy;  Laterality: N/A;  1200  . Carotid endarterectomy Right   . Video bronchoscopy with endobronchial ultrasound N/A 05/29/2014    Procedure: VIDEO BRONCHOSCOPY WITH ENDOBRONCHIAL ULTRASOUND;  Surgeon: Melrose Nakayama, MD;  Location: [facility];  Service: Thoracic;  Laterality: N/A;    REVIEW OF SYSTEMS:  Review of Systems  Constitutional: Negative.   HENT: Negative.   Eyes: Negative.   Respiratory: Positive for cough. Negative for hemoptysis, sputum production, shortness of breath and wheezing.   Cardiovascular: Negative.   Gastrointestinal: Negative.   Genitourinary:  Negative.   Musculoskeletal: Negative.   Skin: Negative.   Neurological: Negative.   Endo/Heme/Allergies: Negative.   Psychiatric/Behavioral: Negative.      PHYSICAL EXAMINATION: Physical Exam  Constitutional: He is oriented to person, place, and time and well-developed, well-nourished, and in no distress.  HENT:  Head: Normocephalic and atraumatic.  Mouth/Throat: Oropharynx is clear and moist.  Eyes: Pupils are equal, round, and reactive to light.  Neck: Normal range of motion. Neck supple. No JVD present. No tracheal deviation present. No thyromegaly present.  Cardiovascular: Normal rate, regular rhythm, normal heart sounds and intact distal pulses.  Exam reveals no gallop and no friction rub.   No murmur heard. Pulmonary/Chest: Effort normal and breath sounds normal. No respiratory distress. He has no wheezes. He has no rales.  Abdominal: Soft. Bowel sounds are normal. He exhibits no distension and no mass. There is no tenderness.  Musculoskeletal: Normal range of motion. He exhibits no edema or tenderness.  Lymphadenopathy:    He has no cervical adenopathy.  Neurological: He is alert and oriented to person, place, and time. He has normal reflexes. Gait normal.  Skin: Skin is warm and dry. No rash noted.    ECOG PERFORMANCE STATUS: 1 - Symptomatic but completely ambulatory  Blood pressure 95/65, pulse 114, temperature 98.2 F (36.8 C), temperature source Oral, resp. rate 18, height $RemoveBe'5\' 5"'bMzNmsTDR$  (1.651 m), weight 119 lb 6.4 oz (54.159 kg), SpO2 99 %.  LABORATORY DATA: Lab Results  Component Value Date   WBC 3.1* 08/14/2014   HGB 10.6* 08/14/2014   HCT 30.6* 08/14/2014   MCV 90.8 08/14/2014   PLT 145 08/14/2014      Chemistry      Component Value Date/Time   NA 141 08/14/2014 1335   NA 136 05/23/2014 1015   NA 139 04/12/2014 1029   K 4.0 08/14/2014 1335   K 4.1 05/23/2014 1015   CL 106 05/23/2014 1015   CO2 19* 08/14/2014 1335   CO2 25 05/23/2014 1015   BUN 13.0  08/14/2014 1335   BUN 10 05/23/2014 1015   BUN 12 04/12/2014 1029   CREATININE 1.1 08/14/2014 1335   CREATININE 1.07 05/23/2014 1015   CREATININE 1.05 11/04/2012 1242      Component Value Date/Time   CALCIUM 8.2* 08/14/2014 1335   CALCIUM 9.3 05/23/2014 1015   ALKPHOS 81 08/14/2014 1335   ALKPHOS 90 05/23/2014 1015   AST 21 08/14/2014 1335   AST 19 05/23/2014 1015   ALT 25 08/14/2014 1335   ALT 17 05/23/2014 1015   BILITOT 0.35 08/14/2014 1335   BILITOT 0.6 05/23/2014 1015       RADIOGRAPHIC STUDIES:  No results found.   ASSESSMENT/PLAN:  No problem-specific assessment & plan notes found for this encounter.  patient is a very pleasant 59 year old male recently diagnosed with unresectable stage II a (T1a, N1, M0) non-small cell lung cancer poorly differentiated carcinoma that presented with right upper lobe  lung nodule in addition to right hilar lymphadenopathy. He is status post a course of concurrent chemoradiation. He tolerated his treatment well.  Patient was discussed with and also seen by Dr. Julien Nordmann. He is advised to increase his by mouth intake both of food and fluids and to add a nutritional supplements such as Ensure or Boost at least twice daily. He will have a CT of the chest in approximately one month to assess response to therapy. He will have a follow-up visit after the CT scan to review these results.  All questions were answered. The patient knows to call the clinic with any problems, questions or concerns. We can certainly see the patient much sooner if necessary.   Mikey Bussing, DNP, AGPCNP-BC, AOCNP  08/14/2014  ADDENDUM:  Hematology/Oncology Attending:  I had a face to face encounter with the patient. I recommended his care plan. This is a very pleasant 59 years old white male was initially unresectable stage IIA non-small cell lung cancer. The patient underwent a course of concurrent chemoradiation with weekly carboplatin and paclitaxel. He tolerated  this course of treatment fairly well except for fatigue and weight loss. We encouraged the patient to increase his oral intake of nutritional supplements. We will give the patient a break from treatment for the next 4 weeks and he would have repeat CT scan of the chest for reevaluation of his disease at that time. The patient would come back for follow-up visit for evaluation and discussion of his scan results in one month. He was advised to call immediately if he has any concerning symptoms in the interval.  Disclaimer: This note was dictated with voice recognition software. Similar sounding words can inadvertently be transcribed and may be missed upon review. Eilleen Kempf., MD 08/19/2014

## 2014-08-14 NOTE — Progress Notes (Signed)
Quick Note:  Call patient with the result and order K Dur 20 meq po qd X 7 days ______ 

## 2014-08-15 ENCOUNTER — Telehealth: Payer: Self-pay | Admitting: Medical Oncology

## 2014-08-15 DIAGNOSIS — E876 Hypokalemia: Secondary | ICD-10-CM

## 2014-08-15 DIAGNOSIS — R630 Anorexia: Secondary | ICD-10-CM

## 2014-08-15 MED ORDER — POTASSIUM CHLORIDE CRYS ER 20 MEQ PO TBCR: 20.0000 meq | EXTENDED_RELEASE_TABLET | Freq: Every day | ORAL | Status: DC

## 2014-08-15 MED ORDER — MEGESTROL ACETATE 625 MG/5ML PO SUSP: 625.0000 mg | Freq: Every day | ORAL | Status: DC

## 2014-08-15 NOTE — Telephone Encounter (Signed)
Pt notified. Pt asked for appetite stimulant and Mohamed prescribed megace.

## 2014-08-15 NOTE — Telephone Encounter (Signed)
-----   Message from Curt Bears, MD sent at 08/14/2014  9:01 PM EDT ----- Call patient with the result and order K Dur 20 meq po qd X 7 days.

## 2014-08-16 ENCOUNTER — Telehealth: Payer: Self-pay

## 2014-08-16 ENCOUNTER — Telehealth: Payer: Self-pay | Admitting: Medical Oncology

## 2014-08-16 ENCOUNTER — Encounter: Payer: Self-pay | Admitting: Internal Medicine

## 2014-08-16 DIAGNOSIS — C3491 Malignant neoplasm of unspecified part of right bronchus or lung: Secondary | ICD-10-CM

## 2014-08-16 MED ORDER — METHYLPREDNISOLONE 4 MG PO TBPK: ORAL_TABLET | ORAL | Status: DC

## 2014-08-16 NOTE — Telephone Encounter (Signed)
Prior authorization request for megace to [facility]

## 2014-08-16 NOTE — Progress Notes (Signed)
I placed form for prior auth for megace on desk of nurse for dr. Julien Nordmann.

## 2014-08-16 NOTE — Telephone Encounter (Signed)
Medrol dose pak ordered and pt notified.

## 2014-08-22 ENCOUNTER — Ambulatory Visit: Payer: Medicaid Other | Admitting: Family Medicine

## 2014-08-24 ENCOUNTER — Ambulatory Visit (INDEPENDENT_AMBULATORY_CARE_PROVIDER_SITE_OTHER): Payer: Medicaid Other | Admitting: Family Medicine

## 2014-08-24 ENCOUNTER — Telehealth: Payer: Self-pay | Admitting: Family Medicine

## 2014-08-24 ENCOUNTER — Encounter: Payer: Self-pay | Admitting: Family Medicine

## 2014-08-24 VITALS — BP 131/87 | HR 101 | Temp 97.1°F | Ht 65.0 in | Wt 117.0 lb

## 2014-08-24 DIAGNOSIS — E119 Type 2 diabetes mellitus without complications: Secondary | ICD-10-CM | POA: Diagnosis not present

## 2014-08-24 DIAGNOSIS — C3491 Malignant neoplasm of unspecified part of right bronchus or lung: Secondary | ICD-10-CM

## 2014-08-24 DIAGNOSIS — E785 Hyperlipidemia, unspecified: Secondary | ICD-10-CM

## 2014-08-24 DIAGNOSIS — Z0000 Encounter for general adult medical examination without abnormal findings: Secondary | ICD-10-CM | POA: Diagnosis not present

## 2014-08-24 DIAGNOSIS — N400 Enlarged prostate without lower urinary tract symptoms: Secondary | ICD-10-CM | POA: Diagnosis not present

## 2014-08-24 DIAGNOSIS — I700 Atherosclerosis of aorta: Secondary | ICD-10-CM | POA: Diagnosis not present

## 2014-08-24 DIAGNOSIS — I10 Essential (primary) hypertension: Secondary | ICD-10-CM

## 2014-08-24 LAB — POCT GLYCOSYLATED HEMOGLOBIN (HGB A1C): Hemoglobin A1C: 9.4

## 2014-08-24 MED ORDER — VALSARTAN 320 MG PO TABS: 320.0000 mg | ORAL_TABLET | Freq: Every day | ORAL | Status: DC

## 2014-08-24 MED ORDER — ATORVASTATIN CALCIUM 40 MG PO TABS: ORAL_TABLET | ORAL | Status: AC

## 2014-08-24 MED ORDER — TIOTROPIUM BROMIDE MONOHYDRATE 18 MCG IN CAPS: 18.0000 ug | ORAL_CAPSULE | Freq: Every day | RESPIRATORY_TRACT | Status: AC

## 2014-08-24 MED ORDER — METFORMIN HCL 500 MG PO TABS: 500.0000 mg | ORAL_TABLET | Freq: Two times a day (BID) | ORAL | Status: DC

## 2014-08-24 MED ORDER — GLUCERNA SHAKE PO LIQD: 237.0000 mL | Freq: Two times a day (BID) | ORAL | Status: AC

## 2014-08-24 NOTE — Addendum Note (Signed)
Addended by: Ilean China on: 08/24/2014 11:45 AM   Modules accepted: Orders

## 2014-08-24 NOTE — Patient Instructions (Addendum)
Continue current medications. Continue good therapeutic lifestyle changes which include good diet and exercise. Fall precautions discussed with patient. If an FOBT was given today- please return it to our front desk. If you are over 61 years old - you may need Prevnar 37 or the adult Pneumonia vaccine.  Flu Shots are still available at our office. If you still haven't had one please call to set up a nurse visit to get one.   After your visit with [place] today you will receive a survey in the mail or online from [employer] regarding your care with [place]. Please take a moment to fill this out. Your feedback is very important to [place] as you can help [place] better understand your patient needs as well as improve your experience and satisfaction. WE CARE ABOUT YOU!!!                        Medicare Annual Wellness Visit  [facility] and the medical providers at [facility] strive to bring you the best medical care.  In doing so we not only want to address your current medical conditions and concerns but also to detect new conditions early and prevent illness, disease and health-related problems.    Medicare offers a yearly Wellness Visit which allows our clinical staff to assess your need for preventative services including immunizations, lifestyle education, counseling to decrease risk of preventable diseases and screening for fall risk and other medical concerns.    This visit is provided free of charge (no copay) for all Medicare recipients. The clinical pharmacists at [facility] have begun to conduct these Wellness Visits which will also include a thorough review of all your medications.    As you primary medical provider recommend that you make an appointment for your Annual Wellness Visit if you have not done so already this year.  You may set up this appointment before you leave today or you may call back [phone]) and schedule an appointment.  Please make sure  when you call that you mention that you are scheduling your Annual Wellness Visit with the clinical pharmacist so that the appointment may be made for the proper length of time.    The patient should stay as active as possible he should follow up with the radiation oncologist and the regular oncologist as planned He should follow-up with the surgeon as planned

## 2014-08-24 NOTE — Telephone Encounter (Signed)
Pt aware to increase his Metformin

## 2014-08-24 NOTE — Progress Notes (Signed)
Subjective:    Patient ID: Adam Warner, male    DOB: 1955-10-04, 59 y.o.   MRN: [id number]  HPI 59 year old male comes in today for follow up on chronic medical conditions which include hyperlipidemia, hypertension, and diabetes. The patient has just finished 25 treatments of radiation and 6 rounds of chemotherapy and all this was completed 2 weeks ago. He is seeing Dr. Koleen Nimrod and will have a repeat CT scan done on May 23. This is for the one cancer. He says that the chemotherapy has run up his blood sugar some. But that it is better now. The surgeon does not want him losing any more weight than he has lost. With that he may need to have more chemotherapy. He denies chest pain. He does have some problems swallowing and thinks this is related to the chemotherapy. He has not had any black tarry stools or blood in the stool and has no problems with passing his water.  Patient Active Problem List   Diagnosis Date Noted  . Non-small cell carcinoma of lung, stage 2 06/22/2014  . FH: lung cancer 06/13/2014  . CAD, NATIVE VESSEL 03/08/2010  . CAROTID ARTERY STENOSIS, WITHOUT INFARCTION 03/08/2010  . LEG PAIN 03/08/2010  . SYNCOPE AND COLLAPSE 03/08/2010  . ABNORMAL CV (STRESS) TEST 03/08/2010  . Hyperlipidemia 02/12/2010  . SMOKER 02/12/2010  . C V A / STROKE 02/12/2010  . COPD GOLD II 02/12/2010  . Pulmonary nodule 02/12/2010  . ABNORMAL LUNG XRAY 02/12/2010  . Diabetes 02/11/2010  . Essential hypertension 02/11/2010  . CAD 02/11/2010  . TIA 02/11/2010   Outpatient Encounter Prescriptions as of 08/24/2014  Medication Sig  . ACCU-CHEK AVIVA PLUS test strip USE TO CHECK ONCE OR TWICE DAILY  . ACCU-CHEK FASTCLIX LANCETS MISC Check blood sugar bid and prn  . aspirin EC 325 MG tablet Take 325 mg by mouth every morning. Hold for surgery  . atorvastatin (LIPITOR) 40 MG tablet TAKE ONE TABLET BY MOUTH ONE TIME DAILY  . Blood Glucose Monitoring Suppl (ONE TOUCH ULTRA 2) W/DEVICE KIT Use monitor  to test blood sugar qd Dx 250.00  . Cholecalciferol (VITAMIN D) 2000 UNITS CAPS Take 1 capsule by mouth daily.  Marland Kitchen DIOVAN 320 MG tablet TAKE ONE TABLET BY MOUTH ONE TIME DAILY  . metFORMIN (GLUCOPHAGE) 500 MG tablet Take 1 tablet (500 mg total) by mouth at bedtime.  . methylPREDNISolone (MEDROL DOSEPAK) 4 MG TBPK tablet Take per package instructions  . potassium chloride SA (K-DUR,KLOR-CON) 20 MEQ tablet Take 1 tablet (20 mEq total) by mouth daily.  . prochlorperazine (COMPAZINE) 10 MG tablet Take 1 tablet (10 mg total) by mouth every 6 (six) hours as needed for nausea or vomiting.  . tiotropium (SPIRIVA) 18 MCG inhalation capsule Place 1 capsule (18 mcg total) into inhaler and inhale daily.   No facility-administered encounter medications on file as of 08/24/2014.       Review of Systems  Constitutional: Negative.   HENT: Negative.   Eyes: Negative.   Respiratory: Negative.   Cardiovascular: Negative.   Gastrointestinal: Negative.   Genitourinary: Negative.   Musculoskeletal: Negative.   Skin: Negative.   Allergic/Immunologic: Negative.   Neurological: Negative.   Hematological: Negative.   Psychiatric/Behavioral: Negative.        Objective:   Physical Exam  Constitutional: He is oriented to person, place, and time. No distress.  Thin and somewhat older looking than his stated age but alert  HENT:  Head: Normocephalic and  atraumatic.  Right Ear: External ear normal.  Left Ear: External ear normal.  Nose: Nose normal.  Mouth/Throat: Oropharynx is clear and moist. No oropharyngeal exudate.  Edentulous  Eyes: Conjunctivae and EOM are normal. Pupils are equal, round, and reactive to light. Right eye exhibits no discharge. Left eye exhibits no discharge. No scleral icterus.  Neck: Normal range of motion. Neck supple. No thyromegaly present.  No thyromegaly or anterior cervical adenopathy  Cardiovascular: Normal rate, regular rhythm, normal heart sounds and intact distal pulses.   Exam reveals no gallop and no friction rub.   No murmur heard. At 84/m  Pulmonary/Chest: Effort normal and breath sounds normal. No respiratory distress. He has no wheezes. He has no rales. He exhibits no tenderness.  Breath sounds are good bilaterally and there is no axillary adenopathy  Abdominal: Soft. Bowel sounds are normal. He exhibits no mass. There is no tenderness. There is no rebound and no guarding.  Patient continues to have an abdominal bruit and he has had regular ultrasounds to evaluate this and everything is stable. He has abdominal aortic atherosclerosis without aneurysm  Genitourinary: Rectum normal and penis normal.  The prostate is enlarged and smooth without lumps or masses. There are no rectal masses. There are no inguinal hernias palpable. The external genitalia were normal. The inguinal nodes were absent.  Musculoskeletal: Normal range of motion. He exhibits no edema or tenderness.  Lymphadenopathy:    He has no cervical adenopathy.  Neurological: He is alert and oriented to person, place, and time. He has normal reflexes. No cranial nerve deficit.  Skin: Skin is warm and dry. No rash noted. No erythema. No pallor.  It is a rash on his back secondary to the radiation treatment  Psychiatric: He has a normal mood and affect. His behavior is normal. Judgment and thought content normal.  Nursing note and vitals reviewed.    BP 131/87 mmHg  Pulse 101  Temp(Src) 97.1 F (36.2 C) (Oral)  Ht _0  (1.651 m)  Wt 117 lb (53.071 kg)  BMI 19.47 kg/m2      Assessment & Plan:  1. Essential hypertension -The blood pressure is under good control today and patient will continue current treatment  2. Hyperlipidemia -He will continue current treatment pending results of lab work - NMR, lipoprofile  3. Type 2 diabetes mellitus without complication -Patient will continue with metformin pending results of lab work - POCT glycosylated hemoglobin (Hb A1C)  4. Health care  maintenance - Vit D  25 hydroxy (rtn osteoporosis monitoring) - PSA, total and free  5. Non-small cell cancer of right lung -The patient will continue follow-up with oncology and his surgeon as planned and get his repeat CT scan as planned in the this month.  6. Abdominal aortic atherosclerosis -He will continue with his current cholesterol treatment  7. BPH (benign prostatic hyperplasia) -He is having no problems with this at the present time.  Meds ordered this encounter  Medications  . tiotropium (SPIRIVA) 18 MCG inhalation capsule    Sig: Place 1 capsule (18 mcg total) into inhaler and inhale daily.    Dispense:  30 capsule    Refill:  6  . valsartan (DIOVAN) 320 MG tablet    Sig: Take 1 tablet (320 mg total) by mouth daily.    Dispense:  30 tablet    Refill:  6  . atorvastatin (LIPITOR) 40 MG tablet    Sig: TAKE ONE TABLET BY MOUTH ONE TIME DAILY  Dispense:  30 tablet    Refill:  6  . feeding supplement, GLUCERNA SHAKE, (GLUCERNA SHAKE) LIQD    Sig: Take 237 mLs by mouth 2 (two) times daily between meals.    Dispense:  60 Can    Refill:  5   Patient Instructions  Continue current medications. Continue good therapeutic lifestyle changes which include good diet and exercise. Fall precautions discussed with patient. If an FOBT was given today- please return it to our front desk. If you are over 50 years old - you may need Prevnar 37 or the adult Pneumonia vaccine.  Flu Shots are still available at our office. If you still haven't had one please call to set up a nurse visit to get one.   After your visit with [place] today you will receive a survey in the mail or online from [employer] regarding your care with [place]. Please take a moment to fill this out. Your feedback is very important to [place] as you can help [place] better understand your patient needs as well as improve your experience and satisfaction. WE CARE ABOUT YOU!!!                        Medicare Annual Wellness  Visit  [facility] and the medical providers at [facility] strive to bring you the best medical care.  In doing so we not only want to address your current medical conditions and concerns but also to detect new conditions early and prevent illness, disease and health-related problems.    Medicare offers a yearly Wellness Visit which allows our clinical staff to assess your need for preventative services including immunizations, lifestyle education, counseling to decrease risk of preventable diseases and screening for fall risk and other medical concerns.    This visit is provided free of charge (no copay) for all Medicare recipients. The clinical pharmacists at [facility] have begun to conduct these Wellness Visits which will also include a thorough review of all your medications.    As you primary medical provider recommend that you make an appointment for your Annual Wellness Visit if you have not done so already this year.  You may set up this appointment before you leave today or you may call back ([phone]) and schedule an appointment.  Please make sure when you call that you mention that you are scheduling your Annual Wellness Visit with the clinical pharmacist so that the appointment may be made for the proper length of time.    The patient should stay as active as possible he should follow up with the radiation oncologist and the regular oncologist as planned He should follow-up with the surgeon as planned    Arrie Senate MD

## 2014-08-25 LAB — VITAMIN D 25 HYDROXY (VIT D DEFICIENCY, FRACTURES): Vit D, 25-Hydroxy: 29.2 ng/mL — ABNORMAL LOW (ref 30.0–100.0)

## 2014-08-25 LAB — NMR, LIPOPROFILE
Cholesterol: 204 mg/dL — ABNORMAL HIGH (ref 100–199)
HDL Cholesterol by NMR: 89 mg/dL (ref >39)
HDL PARTICLE NUMBER: 35.4 umol/L (ref >=30.5)
LDL Particle Number: 963 nmol/L (ref <1000)
LDL SIZE: 21.2 nm (ref >20.5)
LDL-C: 100 mg/dL — AB (ref 0–99)
Triglycerides by NMR: 74 mg/dL (ref 0–149)

## 2014-08-25 LAB — PSA, TOTAL AND FREE
PSA, Free Pct: 41.1 %
PSA, Free: 0.37 ng/mL
Prostate Specific Ag, Serum: 0.9 ng/mL (ref 0.0–4.0)

## 2014-09-11 ENCOUNTER — Ambulatory Visit (HOSPITAL_COMMUNITY)
Admission: RE | Admit: 2014-09-11 | Discharge: 2014-09-11 | Disposition: A | Discharge status: Home / Self Care | Payer: Medicaid Other | Source: Ambulatory Visit | Attending: Oncology | Admitting: Oncology

## 2014-09-11 ENCOUNTER — Encounter (HOSPITAL_COMMUNITY): Payer: Self-pay

## 2014-09-11 ENCOUNTER — Other Ambulatory Visit: Payer: Medicaid Other

## 2014-09-11 ENCOUNTER — Other Ambulatory Visit (HOSPITAL_BASED_OUTPATIENT_CLINIC_OR_DEPARTMENT_OTHER): Payer: Medicaid Other

## 2014-09-11 DIAGNOSIS — C3490 Malignant neoplasm of unspecified part of unspecified bronchus or lung: Secondary | ICD-10-CM | POA: Diagnosis present

## 2014-09-11 DIAGNOSIS — C3411 Malignant neoplasm of upper lobe, right bronchus or lung: Secondary | ICD-10-CM | POA: Diagnosis present

## 2014-09-11 LAB — COMPREHENSIVE METABOLIC PANEL (CC13)
ALBUMIN: 3.5 g/dL (ref 3.5–5.0)
ALK PHOS: 87 U/L (ref 40–150)
ALT: 17 U/L (ref 0–55)
ANION GAP: 15 meq/L — AB (ref 3–11)
AST: 15 U/L (ref 5–34)
BUN: 10.6 mg/dL (ref 7.0–26.0)
CALCIUM: 8.6 mg/dL (ref 8.4–10.4)
CO2: 18 mEq/L — ABNORMAL LOW (ref 22–29)
CREATININE: 0.9 mg/dL (ref 0.7–1.3)
Chloride: 106 mEq/L (ref 98–109)
EGFR: 89 mL/min/{1.73_m2} — ABNORMAL LOW (ref >90)
GLUCOSE: 150 mg/dL — AB (ref 70–140)
POTASSIUM: 3.9 meq/L (ref 3.5–5.1)
SODIUM: 139 meq/L (ref 136–145)
Total Bilirubin: 0.51 mg/dL (ref 0.20–1.20)
Total Protein: 6.7 g/dL (ref 6.4–8.3)

## 2014-09-11 LAB — CBC WITH DIFFERENTIAL/PLATELET
BASO%: 0.8 % (ref 0.0–2.0)
Basophils Absolute: 0 10*3/uL (ref 0.0–0.1)
EOS%: 1.8 % (ref 0.0–7.0)
Eosinophils Absolute: 0.1 10*3/uL (ref 0.0–0.5)
HCT: 34.8 % — ABNORMAL LOW (ref 38.4–49.9)
HGB: 11.8 g/dL — ABNORMAL LOW (ref 13.0–17.1)
LYMPH#: 0.6 10*3/uL — AB (ref 0.9–3.3)
LYMPH%: 15.8 % (ref 14.0–49.0)
MCH: 32.5 pg (ref 27.2–33.4)
MCHC: 33.9 g/dL (ref 32.0–36.0)
MCV: 95.7 fL (ref 79.3–98.0)
MONO#: 0.3 10*3/uL (ref 0.1–0.9)
MONO%: 7.4 % (ref 0.0–14.0)
NEUT%: 74.2 % (ref 39.0–75.0)
NEUTROS ABS: 2.8 10*3/uL (ref 1.5–6.5)
PLATELETS: 233 10*3/uL (ref 140–400)
RBC: 3.64 10*6/uL — ABNORMAL LOW (ref 4.20–5.82)
RDW: 18.6 % — ABNORMAL HIGH (ref 11.0–14.6)
WBC: 3.8 10*3/uL — AB (ref 4.0–10.3)

## 2014-09-11 MED ORDER — IOHEXOL 300 MG/ML  SOLN
100.0000 mL | Freq: Once | INTRAMUSCULAR | Status: AC | PRN
Start: 2014-09-11 — End: 2014-09-11
  Administered 2014-09-11: 80 mL via INTRAVENOUS

## 2014-09-14 ENCOUNTER — Telehealth: Payer: Self-pay | Admitting: Internal Medicine

## 2014-09-14 ENCOUNTER — Encounter: Payer: Self-pay | Admitting: Internal Medicine

## 2014-09-14 ENCOUNTER — Ambulatory Visit (HOSPITAL_BASED_OUTPATIENT_CLINIC_OR_DEPARTMENT_OTHER): Payer: Medicaid Other | Admitting: Internal Medicine

## 2014-09-14 VITALS — BP 131/78 | HR 97 | Temp 97.5°F | Resp 18 | Ht 65.0 in | Wt 118.4 lb

## 2014-09-14 DIAGNOSIS — C3491 Malignant neoplasm of unspecified part of right bronchus or lung: Secondary | ICD-10-CM

## 2014-09-14 DIAGNOSIS — Z720 Tobacco use: Secondary | ICD-10-CM | POA: Diagnosis not present

## 2014-09-14 DIAGNOSIS — C3411 Malignant neoplasm of upper lobe, right bronchus or lung: Secondary | ICD-10-CM

## 2014-09-14 NOTE — Telephone Encounter (Signed)
lvm for pt regarding to [place] appt....mailed pt appt sched/avs and letter

## 2014-09-14 NOTE — Progress Notes (Signed)
[facility] [phone]   Fax:([phone]) [place], [place] [address]  DIAGNOSIS: Unresectable a stage IIA (T1a, N1, M0) non-small cell lung cancer, invasive poorly differentiated carcinoma diagnosed in March 2016  PRIOR THERAPY: A course of concurrent chemoradiation with weekly carboplatin for AUC of 2 and paclitaxel 45 MG/M2  CURRENT THERAPY: None.  INTERVAL HISTORY: Adam Warner 59 y.o. male returns to the clinic today for follow-up visit. The patient is feeling fine today was no specific complaints except for shortness breath with exertion. The patient tolerated the course of concurrent chemoradiation fairly well with no significant complaints. He denied having any significant chest pain, cough or hemoptysis. He denied having any significant nausea or vomiting, no fever or chills. He had repeat CT scan of the chest performed recently and he is here today for evaluation and discussion of his scan results.  MEDICAL HISTORY: Past Medical History  Diagnosis Date  . COPD (chronic obstructive pulmonary disease)   . Stroke   . Hypertension   . Heart attack 1991  . Pulmonary nodule   . Coronary artery disease     sees Dr Spero Curb at [facility] every 2-3 years  . Shortness of breath dyspnea   . Diabetes mellitus     Type Niddm x 2 years  . Full dentures     ALLERGIES:  is allergic to zyban.  MEDICATIONS:  Current Outpatient Prescriptions  Medication Sig Dispense Refill  . ACCU-CHEK AVIVA PLUS test strip USE TO CHECK ONCE OR TWICE DAILY 100 each 0  . ACCU-CHEK FASTCLIX LANCETS MISC Check blood sugar bid and prn 102 each 0  . aspirin EC 325 MG tablet Take 325 mg by mouth every morning. Hold for surgery    . atorvastatin (LIPITOR) 40 MG tablet TAKE ONE TABLET BY MOUTH ONE TIME DAILY 30 tablet 6  . Blood Glucose Monitoring Suppl (ONE TOUCH ULTRA 2) W/DEVICE KIT Use monitor to test blood sugar qd Dx  250.00 1 each 0  . Cholecalciferol (VITAMIN D) 2000 UNITS CAPS Take 1 capsule by mouth daily.    . feeding supplement, GLUCERNA SHAKE, (GLUCERNA SHAKE) LIQD Take 237 mLs by mouth 2 (two) times daily between meals. 60 Can 5  . metFORMIN (GLUCOPHAGE) 500 MG tablet Take 1 tablet (500 mg total) by mouth 2 (two) times daily with a meal. 60 tablet 2  . methylPREDNISolone (MEDROL DOSEPAK) 4 MG TBPK tablet Take per package instructions 21 tablet 0  . potassium chloride SA (K-DUR,KLOR-CON) 20 MEQ tablet Take 1 tablet (20 mEq total) by mouth daily. 7 tablet 0  . prochlorperazine (COMPAZINE) 10 MG tablet Take 1 tablet (10 mg total) by mouth every 6 (six) hours as needed for nausea or vomiting. 30 tablet 0  . tiotropium (SPIRIVA) 18 MCG inhalation capsule Place 1 capsule (18 mcg total) into inhaler and inhale daily. 30 capsule 6  . valsartan (DIOVAN) 320 MG tablet Take 1 tablet (320 mg total) by mouth daily. 30 tablet 6   No current facility-administered medications for this visit.    SURGICAL HISTORY:  Past Surgical History  Procedure Laterality Date  . Lung surgery      Dr Arlyce Dice  . Angioplasty    . Colonoscopy  10/22/2011    Procedure: COLONOSCOPY;  Surgeon: Rogene Houston, MD;  Location: [facility];  Service: Endoscopy;  Laterality: N/A;  1200  . Carotid endarterectomy Right   .  Video bronchoscopy with endobronchial ultrasound N/A 05/29/2014    Procedure: VIDEO BRONCHOSCOPY WITH ENDOBRONCHIAL ULTRASOUND;  Surgeon: Melrose Nakayama, MD;  Location: [facility];  Service: Thoracic;  Laterality: N/A;    REVIEW OF SYSTEMS:  Constitutional: negative Eyes: negative Ears, nose, mouth, throat, and face: negative Respiratory: positive for dyspnea on exertion Cardiovascular: negative Gastrointestinal: negative Genitourinary:negative Integument/breast: negative Hematologic/lymphatic: negative Musculoskeletal:negative Neurological: negative Behavioral/Psych: negative Endocrine:  negative Allergic/Immunologic: negative   PHYSICAL EXAMINATION: General appearance: alert, cooperative and no distress Head: Normocephalic, without obvious abnormality, atraumatic Neck: no adenopathy, no JVD, supple, symmetrical, trachea midline and thyroid not enlarged, symmetric, no tenderness/mass/nodules Lymph nodes: Cervical, supraclavicular, and axillary nodes normal. Resp: clear to auscultation bilaterally Back: symmetric, no curvature. ROM normal. No CVA tenderness. Cardio: regular rate and rhythm, S1, S2 normal, no murmur, click, rub or gallop GI: soft, non-tender; bowel sounds normal; no masses,  no organomegaly Extremities: extremities normal, atraumatic, no cyanosis or edema Neurologic: Alert and oriented X 3, normal strength and tone. Normal symmetric reflexes. Normal coordination and gait  ECOG PERFORMANCE STATUS: 1 - Symptomatic but completely ambulatory  Blood pressure 131/78, pulse 97, temperature 97.5 F (36.4 C), temperature source Oral, resp. rate 18, height _0  (1.651 m), weight 118 lb 6.4 oz (53.706 kg), SpO2 99 %.  LABORATORY DATA: Lab Results  Component Value Date   WBC 3.8* 09/11/2014   HGB 11.8* 09/11/2014   HCT 34.8* 09/11/2014   MCV 95.7 09/11/2014   PLT 233 09/11/2014      Chemistry      Component Value Date/Time   NA 139 09/11/2014 1352   NA 136 05/23/2014 1015   NA 139 04/12/2014 1029   K 3.9 09/11/2014 1352   K 4.1 05/23/2014 1015   CL 106 05/23/2014 1015   CO2 18* 09/11/2014 1352   CO2 25 05/23/2014 1015   BUN 10.6 09/11/2014 1352   BUN 10 05/23/2014 1015   BUN 12 04/12/2014 1029   CREATININE 0.9 09/11/2014 1352   CREATININE 1.07 05/23/2014 1015   CREATININE 1.05 11/04/2012 1242      Component Value Date/Time   CALCIUM 8.6 09/11/2014 1352   CALCIUM 9.3 05/23/2014 1015   ALKPHOS 87 09/11/2014 1352   ALKPHOS 90 05/23/2014 1015   AST 15 09/11/2014 1352   AST 19 05/23/2014 1015   ALT 17 09/11/2014 1352   ALT 17 05/23/2014 1015    BILITOT 0.51 09/11/2014 1352   BILITOT 0.6 05/23/2014 1015       RADIOGRAPHIC STUDIES: Ct Chest W Contrast  09/11/2014   CLINICAL DATA:  Non-small cell lung cancer stage II.  EXAM: CT CHEST WITH CONTRAST  TECHNIQUE: Multidetector CT imaging of the chest was performed during intravenous contrast administration.  CONTRAST:  23m OMNIPAQUE IOHEXOL 300 MG/ML  SOLN  COMPARISON:  06/08/2014  FINDINGS: Mediastinum: Normal heart size. There is no pericardial effusion identified. Atherosclerotic calcifications involve the thoracic aorta as well as the left circumflex and LAD coronary artery, The right hilar mass measures 2.2 by 2.3 cm, image 24 of series 2. On the previous exam this measured 2.9 x 3.3 cm. There is a adjacent right hilar lymph node which measures 7 mm, image 31/ series 2. Previously 9 mm. The trachea is patent and appears midline. Normal appearance of the esophagus.  Lungs/Pleura: Centrilobular and paraseptal emphysema identified. Nodule in the right upper lobe measures 1 cm, image 18 of series 6. Previously 8 mm. Subpleural nodule within the lateral right upper lobe measures 8 mm,  image 24/series 5. Previously 0.9 cm. Diffuse peripheral interstitial reticulation is again noted which appears basilar predominant. Suture line within the left lung appears unchanged from previous exam.  Upper Abdomen: Similar appearance of small scattered low-attenuation foci within the liver which may represent small cysts. These remain too small to characterize. The gallbladder appears normal. The adrenal glands are both unremarkable. Visualized portions of the spleen are normal.  Musculoskeletal: Review of the visualized bony structures is negative for aggressive lytic or sclerotic bone lesion.  IMPRESSION: 1. Interval decrease in size of left hilar mass and adenopathy. 2. Pulmonary nodules within the left upper lobe are not significantly changed when compared with previous exam. 3. Atherosclerotic disease    Electronically Signed   By: Kerby Moors M.D.   On: 09/11/2014 15:21    ASSESSMENT AND PLAN: This is a very pleasant 59 years old white male with history of stage II a non-small cell lung cancer status post a course of concurrent chemoradiation with weekly carboplatin and paclitaxel. The recent CT scan of the chest showed interval decrease in the size of the left hilar mass and adenopathy. I discussed the scan results with the patient. I recommended for him to see Dr. Roxan Hockey for reevaluation for surgical resection based on his pulmonary function tests. The patient is not a surgical candidate, I would see him back for reevaluation and consideration of consolidation chemotherapy with 3 cycles of carboplatin for AUC of 5 and paclitaxel 175 MG/M2 every 3 weeks with Neulasta support. I will discuss this treatment in more details with the patient after his surgical evaluation. He was advised to call immediately if he has any concerning symptoms in the interval. The patient was encouraged to quit smoking and we offered him to smoke cessation program. The patient voices understanding of current disease status and treatment options and is in agreement with the current care plan.  All questions were answered. The patient knows to call the clinic with any problems, questions or concerns. We can certainly see the patient much sooner if necessary.  I spent 15 minutes counseling the patient face to face. The total time spent in the appointment was 25 minutes.  Disclaimer: This note was dictated with voice recognition software. Similar sounding words can inadvertently be transcribed and may not be corrected upon review.

## 2014-09-14 NOTE — Telephone Encounter (Signed)
lvm for pt regarding to [place] [facility] appt....mailed pt appt sched/avs and letter

## 2014-09-19 ENCOUNTER — Telehealth: Payer: Self-pay | Admitting: *Deleted

## 2014-09-19 NOTE — Telephone Encounter (Signed)
Oncology Nurse Navigator Documentation  Oncology Nurse Navigator Flowsheets 09/19/2014  Navigator Encounter Type Coordination of care.  I received in-basket today regarding appt for T-Surgery.  I called and spoke with patient and his wife answered.  I gave her appt time and place.  She verbalized understanding of appt time and place  Patient Visit Type Surgery  Treatment Phase Other  Time Spent with Patient 15

## 2014-09-26 ENCOUNTER — Telehealth: Payer: Self-pay | Admitting: *Deleted

## 2014-09-26 ENCOUNTER — Encounter: Payer: Self-pay | Admitting: Thoracic Surgery (Cardiothoracic Vascular Surgery)

## 2014-09-26 ENCOUNTER — Encounter: Payer: Self-pay | Admitting: *Deleted

## 2014-09-26 ENCOUNTER — Ambulatory Visit (INDEPENDENT_AMBULATORY_CARE_PROVIDER_SITE_OTHER): Payer: Medicaid Other | Admitting: Thoracic Surgery (Cardiothoracic Vascular Surgery)

## 2014-09-26 VITALS — BP 112/79 | HR 125 | Resp 20 | Ht 65.0 in | Wt 117.0 lb

## 2014-09-26 DIAGNOSIS — C3491 Malignant neoplasm of unspecified part of right bronchus or lung: Secondary | ICD-10-CM

## 2014-09-26 NOTE — Progress Notes (Signed)
[address]       [place] [address]             [phone]       HPI:  Adam Warner returns today to discuss possible surgical resection.  He is a 59 year old man who first presented with a lung nodule in 2015. I recommended surgical resection initially, but his wife had just had a massive stroke and he was dealing with that and refused surgery. On his first follow-up scan the nodule in the right upper lobe had decreased in size. On subsequent follow-up scans a had increasing size of right hilar adenopathy. I did an EBUS earlier this year which showed poorly differentiated carcinoma. He has been treated with 6 cycles of carboplatin and paclitaxel and has received 45 gray of radiation. He had repeat scanning done is now sent for consideration for surgical resection.  He tolerated chemotherapy and radiation well. He says his appetite is pretty much been unchanged. He did lose 2 pounds over the past 3 months. He now weighs about 117 pounds. He's not having any problems with shortness of breath.  Past Medical History  Diagnosis Date  . COPD (chronic obstructive pulmonary disease)   . Stroke   . Hypertension   . Heart attack 1991  . Pulmonary nodule   . Coronary artery disease     sees Dr Spero Curb at [facility] every 2-3 years  . Shortness of breath dyspnea   . Diabetes mellitus     Type Niddm x 2 years  . Full dentures    Past Surgical History  Procedure Laterality Date  . Lung surgery      Dr Arlyce Dice  . Angioplasty    . Colonoscopy  10/22/2011    Procedure: COLONOSCOPY;  Surgeon: Rogene Houston, MD;  [facility];  Service: Endoscopy;  Laterality: N/A;  1200  . Carotid endarterectomy Right   . Video bronchoscopy with endobronchial ultrasound N/A 05/29/2014    Procedure: VIDEO BRONCHOSCOPY WITH ENDOBRONCHIAL ULTRASOUND;  Surgeon: Melrose Nakayama, MD;  Location: [facility];  Service: Thoracic;  Laterality: N/A;     Current Outpatient Prescriptions    Medication Sig Dispense Refill  . ACCU-CHEK AVIVA PLUS test strip USE TO CHECK ONCE OR TWICE DAILY 100 each 0  . ACCU-CHEK FASTCLIX LANCETS MISC Check blood sugar bid and prn 102 each 0  . aspirin EC 325 MG tablet Take 325 mg by mouth every morning. Hold for surgery    . atorvastatin (LIPITOR) 40 MG tablet TAKE ONE TABLET BY MOUTH ONE TIME DAILY 30 tablet 6  . Blood Glucose Monitoring Suppl (ONE TOUCH ULTRA 2) W/DEVICE KIT Use monitor to test blood sugar qd Dx 250.00 1 each 0  . Cholecalciferol (VITAMIN D) 2000 UNITS CAPS Take 1 capsule by mouth daily.    . feeding supplement, GLUCERNA SHAKE, (GLUCERNA SHAKE) LIQD Take 237 mLs by mouth 2 (two) times daily between meals. 60 Can 5  . metFORMIN (GLUCOPHAGE) 500 MG tablet Take 1 tablet (500 mg total) by mouth 2 (two) times daily with a meal. 60 tablet 2  . methylPREDNISolone (MEDROL DOSEPAK) 4 MG TBPK tablet Take per package instructions 21 tablet 0  . potassium chloride SA (K-DUR,KLOR-CON) 20 MEQ tablet Take 1 tablet (20 mEq total) by mouth daily. 7 tablet 0  . prochlorperazine (COMPAZINE) 10 MG tablet Take 1 tablet (10 mg total) by mouth every 6 (six) hours as needed for nausea or vomiting. [place]  tablet 0  . tiotropium (SPIRIVA) 18 MCG inhalation capsule Place 1 capsule (18 mcg total) into inhaler and inhale daily. 30 capsule 6  . valsartan (DIOVAN) 320 MG tablet Take 1 tablet (320 mg total) by mouth daily. 30 tablet 6   No current facility-administered medications for this visit.    Physical Exam BP 112/79 mmHg  Pulse 125  Resp 20  Ht 5' 5" (1.651 m)  Wt 117 lb (53.071 kg)  BMI 19.47 kg/m2  SpO2 69% Thin 59 year old man appears older than stated age, no acute distress Alert and oriented 3 with no focal neurologic deficits Cardiac regular rate and rhythm normal S1 and S2 Lungs diminished breath sounds bilaterally, no wheezing No cervical or subclavicular adenopathy No peripheral edema  Diagnostic Tests: CT CHEST WITH  CONTRAST  TECHNIQUE: Multidetector CT imaging of the chest was performed during intravenous contrast administration.  CONTRAST: 27m OMNIPAQUE IOHEXOL 300 MG/ML SOLN  COMPARISON: 06/08/2014  FINDINGS: Mediastinum: Normal heart size. There is no pericardial effusion identified. Atherosclerotic calcifications involve the thoracic aorta as well as the left circumflex and LAD coronary artery, The right hilar mass measures 2.2 by 2.3 cm, image 24 of series 2. On the previous exam this measured 2.9 x 3.3 cm. There is a adjacent right hilar lymph node which measures 7 mm, image 31/ series 2. Previously 9 mm. The trachea is patent and appears midline. Normal appearance of the esophagus.  Lungs/Pleura: Centrilobular and paraseptal emphysema identified. Nodule in the right upper lobe measures 1 cm, image 18 of series 6. Previously 8 mm. Subpleural nodule within the lateral right upper lobe measures 8 mm, image 24/series 5. Previously 0.9 cm. Diffuse peripheral interstitial reticulation is again noted which appears basilar predominant. Suture line within the left lung appears unchanged from previous exam.  Upper Abdomen: Similar appearance of small scattered low-attenuation foci within the liver which may represent small cysts. These remain too small to characterize. The gallbladder appears normal. The adrenal glands are both unremarkable. Visualized portions of the spleen are normal.  Musculoskeletal: Review of the visualized bony structures is negative for aggressive lytic or sclerotic bone lesion.  IMPRESSION: 1. Interval decrease in size of left hilar mass and adenopathy. 2. Pulmonary nodules within the left upper lobe are not significantly changed when compared with previous exam. 3. Atherosclerotic disease   Electronically Signed  By: TKerby MoorsM.D.  On: 09/11/2014 15:21  Impression: 59year old man with stage IIa non-small cell carcinoma who was  unresectable with a lobectomy and therefore sent for chemotherapy and radiation. He has completed 6 cycles of chemotherapy and 45 gray of radiation. He has had a good partial response to treatment.  I personally reviewed the scans and concur with the findings as noted above. I also reviewed the scans in detail with Adam Warner his family.  Reviewing the scan shows the right hilar node completely encasing the posterior descending pulmonary artery branch to the right upper lobe. There is a long segment of the main PA with there is no distinguishable plane between the PA and the hilar node. My suspicion is that this would not be resectable with a lobectomy and would still require a pneumonectomy at this point. He is not a candidate for a pneumonectomy. Overall he is a relatively poor surgical candidate anyway.  I discussed the options of surgical exploration, with a significant chance of unresectability versus completing a full course of chemotherapy and radiation. His family asked about the possibility of surgery after a complete  course of chemotherapy and radiation. That certainly is possible, although I suspect that we would still be in the same situation where there is a high likelihood of finding and is unresectable time of surgery. After a lengthy discussion of all the options, Adam Warner would like to complete his chemotherapy and radiation and then reassess after that.  Plan: Complete full course of chemotherapy and radiation.  I'll be happy to see him back after that to see if there has been no response to warrant surgical resection.  Melrose Nakayama, MD [facility] [phone]

## 2014-09-26 NOTE — Telephone Encounter (Signed)
Oncology Nurse Navigator Documentation  Oncology Nurse Navigator Flowsheets 09/26/2014  Navigator Encounter Type Other  Patient Visit Type   Treatment Phase   Coordination of Care MD Appointments/I received a message from Dr. Roxan Hockey regarding patient needing further treatment and no surgery at this time.  I spoke with Dr. Julien Nordmann.  He stated patient could come now for an appt if available.  Unfortunately, after speaking with [facility] and trying to call him I was unable to reach.  I will notify scheduling to schedule appt with Dr. Julien Nordmann or any APP per Dr. Julien Nordmann.    Time Spent with Patient 15

## 2014-09-28 ENCOUNTER — Telehealth: Payer: Self-pay | Admitting: Hematology and Oncology

## 2014-09-28 ENCOUNTER — Telehealth: Payer: Self-pay | Admitting: Internal Medicine

## 2014-09-28 NOTE — Telephone Encounter (Signed)
Adam Warner from Dr Sondra Come office and confirmed appts for pt

## 2014-10-05 ENCOUNTER — Other Ambulatory Visit (HOSPITAL_BASED_OUTPATIENT_CLINIC_OR_DEPARTMENT_OTHER): Payer: Medicaid Other

## 2014-10-05 ENCOUNTER — Telehealth: Payer: Self-pay | Admitting: *Deleted

## 2014-10-05 ENCOUNTER — Ambulatory Visit (HOSPITAL_BASED_OUTPATIENT_CLINIC_OR_DEPARTMENT_OTHER): Payer: Medicaid Other | Admitting: Physician Assistant

## 2014-10-05 ENCOUNTER — Encounter: Payer: Self-pay | Admitting: Physician Assistant

## 2014-10-05 VITALS — BP 114/72 | HR 110 | Temp 98.4°F | Resp 18 | Ht 65.0 in | Wt 116.5 lb

## 2014-10-05 DIAGNOSIS — C3411 Malignant neoplasm of upper lobe, right bronchus or lung: Secondary | ICD-10-CM | POA: Diagnosis not present

## 2014-10-05 DIAGNOSIS — Z720 Tobacco use: Secondary | ICD-10-CM

## 2014-10-05 DIAGNOSIS — C3491 Malignant neoplasm of unspecified part of right bronchus or lung: Secondary | ICD-10-CM

## 2014-10-05 DIAGNOSIS — Z5111 Encounter for antineoplastic chemotherapy: Secondary | ICD-10-CM

## 2014-10-05 DIAGNOSIS — C3490 Malignant neoplasm of unspecified part of unspecified bronchus or lung: Secondary | ICD-10-CM

## 2014-10-05 LAB — CBC WITH DIFFERENTIAL/PLATELET
BASO%: 0.3 % (ref 0.0–2.0)
Basophils Absolute: 0 10*3/uL (ref 0.0–0.1)
EOS%: 1.3 % (ref 0.0–7.0)
Eosinophils Absolute: 0.1 10*3/uL (ref 0.0–0.5)
HCT: 36.2 % — ABNORMAL LOW (ref 38.4–49.9)
HGB: 12.4 g/dL — ABNORMAL LOW (ref 13.0–17.1)
LYMPH%: 13.2 % — AB (ref 14.0–49.0)
MCH: 33 pg (ref 27.2–33.4)
MCHC: 34.3 g/dL (ref 32.0–36.0)
MCV: 96.3 fL (ref 79.3–98.0)
MONO#: 0.6 10*3/uL (ref 0.1–0.9)
MONO%: 9.8 % (ref 0.0–14.0)
NEUT#: 4.8 10*3/uL (ref 1.5–6.5)
NEUT%: 75.4 % — AB (ref 39.0–75.0)
Platelets: 267 10*3/uL (ref 140–400)
RBC: 3.76 10*6/uL — ABNORMAL LOW (ref 4.20–5.82)
RDW: 14.3 % (ref 11.0–14.6)
WBC: 6.3 10*3/uL (ref 4.0–10.3)
lymph#: 0.8 10*3/uL — ABNORMAL LOW (ref 0.9–3.3)

## 2014-10-05 LAB — COMPREHENSIVE METABOLIC PANEL (CC13)
ALK PHOS: 109 U/L (ref 40–150)
ALT: 11 U/L (ref 0–55)
AST: 12 U/L (ref 5–34)
Albumin: 3.4 g/dL — ABNORMAL LOW (ref 3.5–5.0)
Anion Gap: 10 mEq/L (ref 3–11)
BUN: 17.5 mg/dL (ref 7.0–26.0)
CO2: 20 mEq/L — ABNORMAL LOW (ref 22–29)
Calcium: 9.4 mg/dL (ref 8.4–10.4)
Chloride: 108 mEq/L (ref 98–109)
Creatinine: 1 mg/dL (ref 0.7–1.3)
EGFR: 77 mL/min/{1.73_m2} — AB (ref >90)
Glucose: 174 mg/dl — ABNORMAL HIGH (ref 70–140)
Potassium: 4.3 mEq/L (ref 3.5–5.1)
SODIUM: 137 meq/L (ref 136–145)
TOTAL PROTEIN: 7 g/dL (ref 6.4–8.3)
Total Bilirubin: 0.61 mg/dL (ref 0.20–1.20)

## 2014-10-05 NOTE — Telephone Encounter (Signed)
I advised scheduler to move labs

## 2014-10-05 NOTE — Progress Notes (Addendum)
[facility] Telephone:([phone]) [phone]   Fax:(336) [place], [address] [place] [address]  DIAGNOSIS: Unresectable a stage IIA (T1a, N1, M0) non-small cell lung cancer, invasive poorly differentiated carcinoma diagnosed in March 2016  PRIOR THERAPY: A course of concurrent chemoradiation with weekly carboplatin for AUC of 2 and paclitaxel 45 MG/M2  CURRENT THERAPY: None.  INTERVAL HISTORY: Adam Warner 59 y.o. male returns to the clinic today for follow-up visit. The patient is feeling fine today was no specific complaints except for shortness breath with exertion. He was deemed not a good surgical candidate by Dr. Roxan Hockey due to the right hilar node completely encasing the posterior descending pulmonary artery. This could not be managed by a lobectomy and would require a pneumonectomy and the patient is not a good candidate for this procedure. He needs some further radiation concurrent with chemotherapy.  He has been simulated by Dr. Sondra Come and is due to start his radiation next week. The patient tolerated the initial course of concurrent chemoradiation fairly well.  He denied having any significant chest pain, cough or hemoptysis. He denied having any significant nausea or vomiting, no fever or chills.   MEDICAL HISTORY: Past Medical History  Diagnosis Date  . COPD (chronic obstructive pulmonary disease)   . Stroke   . Hypertension   . Heart attack 1991  . Pulmonary nodule   . Coronary artery disease     sees Dr Spero Curb at [facility] every 2-3 years  . Shortness of breath dyspnea   . Diabetes mellitus     Type Niddm x 2 years  . Full dentures     ALLERGIES:  is allergic to zyban.  MEDICATIONS:  Current Outpatient Prescriptions  Medication Sig Dispense Refill  . ACCU-CHEK AVIVA PLUS test strip USE TO CHECK ONCE OR TWICE DAILY 100 each 0  . ACCU-CHEK FASTCLIX LANCETS MISC Check blood sugar bid and prn 102 each  0  . aspirin EC 325 MG tablet Take 325 mg by mouth every morning. Hold for surgery    . atorvastatin (LIPITOR) 40 MG tablet TAKE ONE TABLET BY MOUTH ONE TIME DAILY 30 tablet 6  . Blood Glucose Monitoring Suppl (ONE TOUCH ULTRA 2) W/DEVICE KIT Use monitor to test blood sugar qd Dx 250.00 1 each 0  . Cholecalciferol (VITAMIN D) 2000 UNITS CAPS Take 1 capsule by mouth daily.    . feeding supplement, GLUCERNA SHAKE, (GLUCERNA SHAKE) LIQD Take 237 mLs by mouth 2 (two) times daily between meals. 60 Can 5  . metFORMIN (GLUCOPHAGE) 500 MG tablet Take 1 tablet (500 mg total) by mouth 2 (two) times daily with a meal. 60 tablet 2  . prochlorperazine (COMPAZINE) 10 MG tablet Take 1 tablet (10 mg total) by mouth every 6 (six) hours as needed for nausea or vomiting. 30 tablet 0  . tiotropium (SPIRIVA) 18 MCG inhalation capsule Place 1 capsule (18 mcg total) into inhaler and inhale daily. 30 capsule 6  . valsartan (DIOVAN) 320 MG tablet Take 1 tablet (320 mg total) by mouth daily. 30 tablet 6   No current facility-administered medications for this visit.    SURGICAL HISTORY:  Past Surgical History  Procedure Laterality Date  . Lung surgery      Dr Arlyce Dice  . Angioplasty    . Colonoscopy  10/22/2011    Procedure: COLONOSCOPY;  Surgeon: Rogene Houston, MD;  [facility];  Service: Endoscopy;  Laterality: N/A;  1200  . Carotid endarterectomy Right   . Video bronchoscopy with endobronchial ultrasound N/A 05/29/2014    Procedure: VIDEO BRONCHOSCOPY WITH ENDOBRONCHIAL ULTRASOUND;  Surgeon: Melrose Nakayama, MD;  Location: [facility];  Service: Thoracic;  Laterality: N/A;    REVIEW OF SYSTEMS:  Constitutional: negative Eyes: negative Ears, nose, mouth, throat, and face: negative Respiratory: positive for dyspnea on exertion Cardiovascular: negative Gastrointestinal: negative Genitourinary:negative Integument/breast: negative Hematologic/lymphatic: negative Musculoskeletal:negative Neurological:  negative Behavioral/Psych: negative Endocrine: negative Allergic/Immunologic: negative   PHYSICAL EXAMINATION: General appearance: alert, cooperative and no distress Head: Normocephalic, without obvious abnormality, atraumatic Neck: no adenopathy, no JVD, supple, symmetrical, trachea midline and thyroid not enlarged, symmetric, no tenderness/mass/nodules Lymph nodes: Cervical, supraclavicular, and axillary nodes normal. Resp: clear to auscultation bilaterally Back: symmetric, no curvature. ROM normal. No CVA tenderness. Cardio: regular rate and rhythm, S1, S2 normal, no murmur, click, rub or gallop GI: soft, non-tender; bowel sounds normal; no masses,  no organomegaly Extremities: extremities normal, atraumatic, no cyanosis or edema Neurologic: Alert and oriented X 3, normal strength and tone. Normal symmetric reflexes. Normal coordination and gait  ECOG PERFORMANCE STATUS: 1 - Symptomatic but completely ambulatory  Blood pressure 114/72, pulse 110, temperature 98.4 F (36.9 C), temperature source Oral, resp. rate 18, height _0  (1.651 m), weight 116 lb 8 oz (52.844 kg), SpO2 99 %.  LABORATORY DATA: Lab Results  Component Value Date   WBC 6.3 10/05/2014   HGB 12.4* 10/05/2014   HCT 36.2* 10/05/2014   MCV 96.3 10/05/2014   PLT 267 10/05/2014      Chemistry      Component Value Date/Time   NA 137 10/05/2014 0921   NA 136 05/23/2014 1015   NA 139 04/12/2014 1029   K 4.3 10/05/2014 0921   K 4.1 05/23/2014 1015   CL 106 05/23/2014 1015   CO2 20* 10/05/2014 0921   CO2 25 05/23/2014 1015   BUN 17.5 10/05/2014 0921   BUN 10 05/23/2014 1015   BUN 12 04/12/2014 1029   CREATININE 1.0 10/05/2014 0921   CREATININE 1.07 05/23/2014 1015   CREATININE 1.05 11/04/2012 1242      Component Value Date/Time   CALCIUM 9.4 10/05/2014 0921   CALCIUM 9.3 05/23/2014 1015   ALKPHOS 109 10/05/2014 0921   ALKPHOS 90 05/23/2014 1015   AST 12 10/05/2014 0921   AST 19 05/23/2014 1015   ALT  11 10/05/2014 0921   ALT 17 05/23/2014 1015   BILITOT 0.61 10/05/2014 0921   BILITOT 0.6 05/23/2014 1015       RADIOGRAPHIC STUDIES: Ct Chest W Contrast  09/27/2014   ADDENDUM REPORT: 09/27/2014 14:57  IMPRESSION: 1. Interval decrease in size of right hilar mass and adenopathy.  2. Pulmonary nodules within the left upper lobe are not significantly changed when compared with previous exam.  3. Atherosclerotic disease   Electronically Signed   By: Kerby Moors M.D.   On: 09/27/2014 14:57   09/27/2014   CLINICAL DATA:  Non-small cell lung cancer stage II.  EXAM: CT CHEST WITH CONTRAST  TECHNIQUE: Multidetector CT imaging of the chest was performed during intravenous contrast administration.  CONTRAST:  38m OMNIPAQUE IOHEXOL 300 MG/ML  SOLN  COMPARISON:  06/08/2014  FINDINGS: Mediastinum: Normal heart size. There is no pericardial effusion identified. Atherosclerotic calcifications involve the thoracic aorta as well as the left circumflex and LAD coronary artery, The right hilar mass measures 2.2 by 2.3 cm, image 24 of series 2. On the previous exam  this measured 2.9 x 3.3 cm. There is a adjacent right hilar lymph node which measures 7 mm, image 31/ series 2. Previously 9 mm. The trachea is patent and appears midline. Normal appearance of the esophagus.  Lungs/Pleura: Centrilobular and paraseptal emphysema identified. Nodule in the right upper lobe measures 1 cm, image 18 of series 6. Previously 8 mm. Subpleural nodule within the lateral right upper lobe measures 8 mm, image 24/series 5. Previously 0.9 cm. Diffuse peripheral interstitial reticulation is again noted which appears basilar predominant. Suture line within the left lung appears unchanged from previous exam.  Upper Abdomen: Similar appearance of small scattered low-attenuation foci within the liver which may represent small cysts. These remain too small to characterize. The gallbladder appears normal. The adrenal glands are both unremarkable.  Visualized portions of the spleen are normal.  Musculoskeletal: Review of the visualized bony structures is negative for aggressive lytic or sclerotic bone lesion.  IMPRESSION: 1. Interval decrease in size of left hilar mass and adenopathy. 2. Pulmonary nodules within the left upper lobe are not significantly changed when compared with previous exam. 3. Atherosclerotic disease  Electronically Signed: By: Kerby Moors M.D. On: 09/11/2014 15:21    ASSESSMENT AND PLAN: This is a very pleasant 59 years old white male with history of stage II a non-small cell lung cancer status post a course of concurrent chemoradiation with weekly carboplatin and paclitaxel. The recent CT scan of the chest showed interval decrease in the size of the left hilar mass and adenopathy. The patient was discussed with and also seen by Dr. Julien Nordmann. He will require further concurrent chemoradiation, an additional 2-3 weeks of therapy. We will arrange for him to begin early next week and we'll plan to see him in follow-up in 2 weeks. He was advised to call immediately if he has any concerning symptoms in the interval. The patient was again encouraged to quit smoking and we offered him to smoke cessation program. The patient voices understanding of current disease status and treatment options and is in agreement with the current care plan.  All questions were answered. The patient knows to call the clinic with any problems, questions or concerns. We can certainly see the patient much sooner if necessary.  Carlton Adam, PA-C 10/05/2014   ADDENDUM: Hematology/Oncology Attending: I had a face to face encounter with the patient. I recommended his care plan. This is a very pleasant 59 years old white male with unresectable a stage II a non-small cell lung cancer status post short course of concurrent chemoradiation followed by surgical evaluation by Dr. Roxan Hockey. Unfortunately the patient was not a good surgical candidate for  resection because of his poor pulmonary function test angina status. He is here today for reevaluation and to resume his treatment with concurrent chemoradiation for 3 more weeks. I discussed the treatment plan with the patient and he would like to proceed with his treatment as a scheduled. He would come back for follow-up visit in 2 weeks for reevaluation and management of any adverse effect of his treatment. He was advised to call immediately if he has any concerning symptoms in the interval.  Disclaimer: This note was dictated with voice recognition software. Similar sounding words can inadvertently be transcribed and may be missed upon review. Eilleen Kempf., MD 10/10/2014

## 2014-10-05 NOTE — Telephone Encounter (Signed)
Per staff message and POF I have scheduled appts. Advised scheduler of appts. JMW  

## 2014-10-09 ENCOUNTER — Other Ambulatory Visit: Payer: Self-pay | Admitting: Internal Medicine

## 2014-10-09 ENCOUNTER — Other Ambulatory Visit (HOSPITAL_BASED_OUTPATIENT_CLINIC_OR_DEPARTMENT_OTHER): Payer: Medicaid Other

## 2014-10-09 ENCOUNTER — Ambulatory Visit (HOSPITAL_BASED_OUTPATIENT_CLINIC_OR_DEPARTMENT_OTHER): Payer: Medicaid Other

## 2014-10-09 VITALS — BP 146/78 | HR 84 | Temp 98.0°F | Resp 18

## 2014-10-09 DIAGNOSIS — C3411 Malignant neoplasm of upper lobe, right bronchus or lung: Secondary | ICD-10-CM

## 2014-10-09 DIAGNOSIS — Z5111 Encounter for antineoplastic chemotherapy: Secondary | ICD-10-CM

## 2014-10-09 DIAGNOSIS — C3491 Malignant neoplasm of unspecified part of right bronchus or lung: Secondary | ICD-10-CM

## 2014-10-09 LAB — COMPREHENSIVE METABOLIC PANEL (CC13)
ALK PHOS: 122 U/L (ref 40–150)
ALT: 10 U/L (ref 0–55)
AST: 13 U/L (ref 5–34)
Albumin: 3.7 g/dL (ref 3.5–5.0)
Anion Gap: 10 mEq/L (ref 3–11)
BUN: 13.1 mg/dL (ref 7.0–26.0)
CO2: 22 mEq/L (ref 22–29)
CREATININE: 0.9 mg/dL (ref 0.7–1.3)
Calcium: 10.2 mg/dL (ref 8.4–10.4)
Chloride: 110 mEq/L — ABNORMAL HIGH (ref 98–109)
EGFR: >90 mL/min/{1.73_m2} (ref >90)
Glucose: 149 mg/dl — ABNORMAL HIGH (ref 70–140)
Potassium: 4 mEq/L (ref 3.5–5.1)
Sodium: 141 mEq/L (ref 136–145)
Total Bilirubin: 0.62 mg/dL (ref 0.20–1.20)
Total Protein: 7.6 g/dL (ref 6.4–8.3)

## 2014-10-09 LAB — CBC WITH DIFFERENTIAL/PLATELET
BASO%: 0.5 % (ref 0.0–2.0)
Basophils Absolute: 0 10*3/uL (ref 0.0–0.1)
EOS%: 1.6 % (ref 0.0–7.0)
Eosinophils Absolute: 0.1 10*3/uL (ref 0.0–0.5)
HCT: 39.4 % (ref 38.4–49.9)
HGB: 13.3 g/dL (ref 13.0–17.1)
LYMPH%: 10.2 % — ABNORMAL LOW (ref 14.0–49.0)
MCH: 33.1 pg (ref 27.2–33.4)
MCHC: 33.7 g/dL (ref 32.0–36.0)
MCV: 98.1 fL — AB (ref 79.3–98.0)
MONO#: 0.6 10*3/uL (ref 0.1–0.9)
MONO%: 9.1 % (ref 0.0–14.0)
NEUT#: 4.9 10*3/uL (ref 1.5–6.5)
NEUT%: 78.6 % — AB (ref 39.0–75.0)
PLATELETS: 296 10*3/uL (ref 140–400)
RBC: 4.01 10*6/uL — AB (ref 4.20–5.82)
RDW: 14.6 % (ref 11.0–14.6)
WBC: 6.2 10*3/uL (ref 4.0–10.3)
lymph#: 0.6 10*3/uL — ABNORMAL LOW (ref 0.9–3.3)

## 2014-10-09 MED ORDER — FAMOTIDINE IN NACL 20-0.9 MG/50ML-% IV SOLN
20.0000 mg | Freq: Once | INTRAVENOUS | Status: AC
  Administered 2014-10-09: 20 mg via INTRAVENOUS

## 2014-10-09 MED ORDER — SODIUM CHLORIDE 0.9 % IV SOLN
Freq: Once | INTRAVENOUS | Status: AC
  Administered 2014-10-09: 10:00:00 via INTRAVENOUS
  Filled 2014-10-09: qty 8

## 2014-10-09 MED ORDER — SODIUM CHLORIDE 0.9 % IV SOLN
172.6000 mg | Freq: Once | INTRAVENOUS | Status: AC
  Administered 2014-10-09: 170 mg via INTRAVENOUS
  Filled 2014-10-09: qty 17

## 2014-10-09 MED ORDER — DIPHENHYDRAMINE HCL 50 MG/ML IJ SOLN
50.0000 mg | Freq: Once | INTRAMUSCULAR | Status: AC
  Administered 2014-10-09: 50 mg via INTRAVENOUS

## 2014-10-09 MED ORDER — PACLITAXEL CHEMO INJECTION 300 MG/50ML
45.0000 mg/m2 | Freq: Once | INTRAVENOUS | Status: AC
  Administered 2014-10-09: 72 mg via INTRAVENOUS
  Filled 2014-10-09: qty 12

## 2014-10-09 MED ORDER — FAMOTIDINE IN NACL 20-0.9 MG/50ML-% IV SOLN
INTRAVENOUS | Status: AC
  Filled 2014-10-09: qty 50

## 2014-10-09 MED ORDER — DIPHENHYDRAMINE HCL 50 MG/ML IJ SOLN
INTRAMUSCULAR | Status: AC
  Filled 2014-10-09: qty 1

## 2014-10-09 MED ORDER — SODIUM CHLORIDE 0.9 % IV SOLN
Freq: Once | INTRAVENOUS | Status: AC
  Administered 2014-10-09: 10:00:00 via INTRAVENOUS

## 2014-10-09 NOTE — Patient Instructions (Signed)
[facility] Discharge Instructions for Patients Receiving Chemotherapy  Today you received the following chemotherapy agents Taxol and Carboplatin.  To help prevent nausea and vomiting after your treatment, we encourage you to take your nausea medication as prescribed.   If you develop nausea and vomiting that is not controlled by your nausea medication, call the clinic.   BELOW ARE SYMPTOMS THAT SHOULD BE REPORTED IMMEDIATELY:  *FEVER GREATER THAN 100.5 F  *CHILLS WITH OR WITHOUT FEVER  NAUSEA AND VOMITING THAT IS NOT CONTROLLED WITH YOUR NAUSEA MEDICATION  *UNUSUAL SHORTNESS OF BREATH  *UNUSUAL BRUISING OR BLEEDING  TENDERNESS IN MOUTH AND THROAT WITH OR WITHOUT PRESENCE OF ULCERS  *URINARY PROBLEMS  *BOWEL PROBLEMS  UNUSUAL RASH Items with * indicate a potential emergency and should be followed up as soon as possible.  Feel free to call the clinic you have any questions or concerns. The clinic phone number is [phone].  Please show the CHEMO ALERT CARD at check-in to the Emergency Department and triage nurse.   

## 2014-10-10 ENCOUNTER — Telehealth: Payer: Self-pay | Admitting: Internal Medicine

## 2014-10-10 DIAGNOSIS — Z5111 Encounter for antineoplastic chemotherapy: Secondary | ICD-10-CM | POA: Insufficient documentation

## 2014-10-10 NOTE — Telephone Encounter (Signed)
returned call to Misty Stanley at [facility] [phone] ext [phone] she needed to confirm pt appts

## 2014-10-10 NOTE — Patient Instructions (Signed)
Continue your course of concurrent chemoradiation as scheduled Followup in 2 weeks 

## 2014-10-16 ENCOUNTER — Ambulatory Visit (HOSPITAL_BASED_OUTPATIENT_CLINIC_OR_DEPARTMENT_OTHER): Payer: Medicaid Other | Admitting: Oncology

## 2014-10-16 ENCOUNTER — Other Ambulatory Visit (HOSPITAL_BASED_OUTPATIENT_CLINIC_OR_DEPARTMENT_OTHER): Payer: Medicaid Other

## 2014-10-16 ENCOUNTER — Encounter: Payer: Self-pay | Admitting: Oncology

## 2014-10-16 ENCOUNTER — Ambulatory Visit (HOSPITAL_BASED_OUTPATIENT_CLINIC_OR_DEPARTMENT_OTHER): Payer: Medicaid Other

## 2014-10-16 ENCOUNTER — Telehealth: Payer: Self-pay | Admitting: Oncology

## 2014-10-16 VITALS — BP 121/81 | HR 113 | Temp 97.5°F | Resp 18 | Ht 65.0 in | Wt 116.2 lb

## 2014-10-16 DIAGNOSIS — Z5111 Encounter for antineoplastic chemotherapy: Secondary | ICD-10-CM

## 2014-10-16 DIAGNOSIS — C3490 Malignant neoplasm of unspecified part of unspecified bronchus or lung: Secondary | ICD-10-CM

## 2014-10-16 DIAGNOSIS — C3491 Malignant neoplasm of unspecified part of right bronchus or lung: Secondary | ICD-10-CM

## 2014-10-16 DIAGNOSIS — C3411 Malignant neoplasm of upper lobe, right bronchus or lung: Secondary | ICD-10-CM

## 2014-10-16 LAB — COMPREHENSIVE METABOLIC PANEL (CC13)
ALT: 12 U/L (ref 0–55)
AST: 11 U/L (ref 5–34)
Albumin: 3.3 g/dL — ABNORMAL LOW (ref 3.5–5.0)
Alkaline Phosphatase: 106 U/L (ref 40–150)
Anion Gap: 9 mEq/L (ref 3–11)
BUN: 15.4 mg/dL (ref 7.0–26.0)
CO2: 22 mEq/L (ref 22–29)
Calcium: 9.3 mg/dL (ref 8.4–10.4)
Chloride: 108 mEq/L (ref 98–109)
Creatinine: 1.1 mg/dL (ref 0.7–1.3)
EGFR: 76 mL/min/{1.73_m2} — ABNORMAL LOW (ref >90)
Glucose: 225 mg/dl — ABNORMAL HIGH (ref 70–140)
Potassium: 4.3 mEq/L (ref 3.5–5.1)
Sodium: 139 mEq/L (ref 136–145)
Total Bilirubin: 0.54 mg/dL (ref 0.20–1.20)
Total Protein: 6.8 g/dL (ref 6.4–8.3)

## 2014-10-16 LAB — CBC WITH DIFFERENTIAL/PLATELET
BASO%: 0.5 % (ref 0.0–2.0)
Basophils Absolute: 0 10*3/uL (ref 0.0–0.1)
EOS%: 1.3 % (ref 0.0–7.0)
Eosinophils Absolute: 0.1 10*3/uL (ref 0.0–0.5)
HEMATOCRIT: 35.2 % — AB (ref 38.4–49.9)
HGB: 11.9 g/dL — ABNORMAL LOW (ref 13.0–17.1)
LYMPH#: 0.5 10*3/uL — AB (ref 0.9–3.3)
LYMPH%: 8.2 % — ABNORMAL LOW (ref 14.0–49.0)
MCH: 32.9 pg (ref 27.2–33.4)
MCHC: 33.6 g/dL (ref 32.0–36.0)
MCV: 97.8 fL (ref 79.3–98.0)
MONO#: 0.6 10*3/uL (ref 0.1–0.9)
MONO%: 9.9 % (ref 0.0–14.0)
NEUT#: 4.7 10*3/uL (ref 1.5–6.5)
NEUT%: 80.1 % — ABNORMAL HIGH (ref 39.0–75.0)
Platelets: 269 10*3/uL (ref 140–400)
RBC: 3.61 10*6/uL — ABNORMAL LOW (ref 4.20–5.82)
RDW: 13.5 % (ref 11.0–14.6)
WBC: 5.8 10*3/uL (ref 4.0–10.3)

## 2014-10-16 MED ORDER — SODIUM CHLORIDE 0.9 % IV SOLN
172.6000 mg | Freq: Once | INTRAVENOUS | Status: AC
  Administered 2014-10-16: 170 mg via INTRAVENOUS
  Filled 2014-10-16: qty 17

## 2014-10-16 MED ORDER — SODIUM CHLORIDE 0.9 % IV SOLN
Freq: Once | INTRAVENOUS | Status: AC
  Administered 2014-10-16: 11:00:00 via INTRAVENOUS
  Filled 2014-10-16: qty 8

## 2014-10-16 MED ORDER — FAMOTIDINE IN NACL 20-0.9 MG/50ML-% IV SOLN
20.0000 mg | Freq: Once | INTRAVENOUS | Status: AC
  Administered 2014-10-16: 20 mg via INTRAVENOUS

## 2014-10-16 MED ORDER — SODIUM CHLORIDE 0.9 % IV SOLN
Freq: Once | INTRAVENOUS | Status: AC
  Administered 2014-10-16: 11:00:00 via INTRAVENOUS

## 2014-10-16 MED ORDER — FAMOTIDINE IN NACL 20-0.9 MG/50ML-% IV SOLN
INTRAVENOUS | Status: AC
  Filled 2014-10-16: qty 50

## 2014-10-16 MED ORDER — DIPHENHYDRAMINE HCL 50 MG/ML IJ SOLN
50.0000 mg | Freq: Once | INTRAMUSCULAR | Status: AC
  Administered 2014-10-16: 50 mg via INTRAVENOUS

## 2014-10-16 MED ORDER — DIPHENHYDRAMINE HCL 50 MG/ML IJ SOLN
INTRAMUSCULAR | Status: AC
  Filled 2014-10-16: qty 1

## 2014-10-16 MED ORDER — PACLITAXEL CHEMO INJECTION 300 MG/50ML
45.0000 mg/m2 | Freq: Once | INTRAVENOUS | Status: AC
  Administered 2014-10-16: 72 mg via INTRAVENOUS
  Filled 2014-10-16: qty 12

## 2014-10-16 NOTE — Telephone Encounter (Signed)
Labs/ov added per 06/27 POF, also sent msg to move chemo in the am on 07/05 per pt request will add labs once this is done, request that pt get copy of schedule before leaving chemo today... KJ

## 2014-10-16 NOTE — Patient Instructions (Signed)
[facility] Discharge Instructions for Patients Receiving Chemotherapy  Today you received the following chemotherapy agents taxol/carboplatin  To help prevent nausea and vomiting after your treatment, we encourage you to take your nausea medication as directed   If you develop nausea and vomiting that is not controlled by your nausea medication, call the clinic.   BELOW ARE SYMPTOMS THAT SHOULD BE REPORTED IMMEDIATELY:  *FEVER GREATER THAN 100.5 F  *CHILLS WITH OR WITHOUT FEVER  NAUSEA AND VOMITING THAT IS NOT CONTROLLED WITH YOUR NAUSEA MEDICATION  *UNUSUAL SHORTNESS OF BREATH  *UNUSUAL BRUISING OR BLEEDING  TENDERNESS IN MOUTH AND THROAT WITH OR WITHOUT PRESENCE OF ULCERS  *URINARY PROBLEMS  *BOWEL PROBLEMS  UNUSUAL RASH Items with * indicate a potential emergency and should be followed up as soon as possible.  Feel free to call the clinic you have any questions or concerns. The clinic phone number is [phone].  

## 2014-10-16 NOTE — Progress Notes (Signed)
[facility] [phone]   Fax:(336) [place], [place] [address]  DIAGNOSIS: Unresectable a stage IIA (T1a, N1, M0) non-small cell lung cancer, invasive poorly differentiated carcinoma diagnosed in March 2016  PRIOR THERAPY: A course of concurrent chemoradiation with weekly carboplatin for AUC of 2 and paclitaxel 45 MG/M2  CURRENT THERAPY: concurrent chemoradiation with weekly carboplatin for AUC of 2 and paclitaxel 45 mg/m2 resumed on 10/09/14 .  INTERVAL HISTORY: Adam Warner 59 y.o. male returns to the clinic today for follow-up visit. The patient is feeling fine today was no specific complaints except for shortness breath with exertion. He was deemed not a good surgical candidate by Dr. Roxan Hockey due to the right hilar node completely encasing the posterior descending pulmonary artery. This could not be managed by a lobectomy and would require a pneumonectomy and the patient is not a good candidate for this procedure. He has now restarted radiation concurrent with chemotherapy. He denied having any significant chest pain, cough or hemoptysis. He denied having any significant nausea or vomiting, no fever or chills.   MEDICAL HISTORY: Past Medical History  Diagnosis Date  . COPD (chronic obstructive pulmonary disease)   . Stroke   . Hypertension   . Heart attack 1991  . Pulmonary nodule   . Coronary artery disease     sees Dr Spero Curb at [facility] every 2-3 years  . Shortness of breath dyspnea   . Diabetes mellitus     Type Niddm x 2 years  . Full dentures     ALLERGIES:  is allergic to zyban.  MEDICATIONS:  Current Outpatient Prescriptions  Medication Sig Dispense Refill  . ACCU-CHEK AVIVA PLUS test strip USE TO CHECK ONCE OR TWICE DAILY 100 each 0  . ACCU-CHEK FASTCLIX LANCETS MISC Check blood sugar bid and prn 102 each 0  . aspirin EC 325 MG tablet Take 325 mg by mouth every  morning. Hold for surgery    . atorvastatin (LIPITOR) 40 MG tablet TAKE ONE TABLET BY MOUTH ONE TIME DAILY 30 tablet 6  . Blood Glucose Monitoring Suppl (ONE TOUCH ULTRA 2) W/DEVICE KIT Use monitor to test blood sugar qd Dx 250.00 1 each 0  . Cholecalciferol (VITAMIN D) 2000 UNITS CAPS Take 1 capsule by mouth daily.    . feeding supplement, GLUCERNA SHAKE, (GLUCERNA SHAKE) LIQD Take 237 mLs by mouth 2 (two) times daily between meals. 60 Can 5  . metFORMIN (GLUCOPHAGE) 500 MG tablet Take 1 tablet (500 mg total) by mouth 2 (two) times daily with a meal. 60 tablet 2  . prochlorperazine (COMPAZINE) 10 MG tablet Take 1 tablet (10 mg total) by mouth every 6 (six) hours as needed for nausea or vomiting. 30 tablet 0  . tiotropium (SPIRIVA) 18 MCG inhalation capsule Place 1 capsule (18 mcg total) into inhaler and inhale daily. 30 capsule 6  . valsartan (DIOVAN) 320 MG tablet Take 1 tablet (320 mg total) by mouth daily. 30 tablet 6   No current facility-administered medications for this visit.    SURGICAL HISTORY:  Past Surgical History  Procedure Laterality Date  . Lung surgery      Dr Arlyce Dice  . Angioplasty    . Colonoscopy  10/22/2011    Procedure: COLONOSCOPY;  Surgeon: Rogene Houston, MD;  [facility];  Service: Endoscopy;  Laterality: N/A;  1200  . Carotid endarterectomy Right   .  Video bronchoscopy with endobronchial ultrasound N/A 05/29/2014    Procedure: VIDEO BRONCHOSCOPY WITH ENDOBRONCHIAL ULTRASOUND;  Surgeon: Melrose Nakayama, MD;  Location: [facility];  Service: Thoracic;  Laterality: N/A;    REVIEW OF SYSTEMS:  Constitutional: negative Eyes: negative Ears, nose, mouth, throat, and face: negative Respiratory: positive for dyspnea on exertion Cardiovascular: negative Gastrointestinal: negative Genitourinary:negative Integument/breast: negative Hematologic/lymphatic: negative Musculoskeletal:negative Neurological: negative Behavioral/Psych: negative Endocrine:  negative Allergic/Immunologic: negative   PHYSICAL EXAMINATION: General appearance: alert, cooperative and no distress Head: Normocephalic, without obvious abnormality, atraumatic Neck: no adenopathy, no JVD, supple, symmetrical, trachea midline and thyroid not enlarged, symmetric, no tenderness/mass/nodules Lymph nodes: Cervical, supraclavicular, and axillary nodes normal. Resp: clear to auscultation bilaterally Back: symmetric, no curvature. ROM normal. No CVA tenderness. Cardio: regular rate and rhythm, S1, S2 normal, no murmur, click, rub or gallop GI: soft, non-tender; bowel sounds normal; no masses,  no organomegaly Extremities: extremities normal, atraumatic, no cyanosis or edema Neurologic: Alert and oriented X 3, normal strength and tone. Normal symmetric reflexes. Normal coordination and gait  ECOG PERFORMANCE STATUS: 1 - Symptomatic but completely ambulatory  Blood pressure 121/81, pulse 113, temperature 97.5 F (36.4 C), temperature source Oral, resp. rate 18, height $RemoveBe'5\' 5"'wHHpZOOMA$  (1.651 m), weight 116 lb 3.2 oz (52.708 kg), SpO2 97 %.  LABORATORY DATA: Lab Results  Component Value Date   WBC 5.8 10/16/2014   HGB 11.9* 10/16/2014   HCT 35.2* 10/16/2014   MCV 97.8 10/16/2014   PLT 269 10/16/2014      Chemistry      Component Value Date/Time   NA 139 10/16/2014 0929   NA 136 05/23/2014 1015   NA 139 04/12/2014 1029   K 4.3 10/16/2014 0929   K 4.1 05/23/2014 1015   CL 106 05/23/2014 1015   CO2 22 10/16/2014 0929   CO2 25 05/23/2014 1015   BUN 15.4 10/16/2014 0929   BUN 10 05/23/2014 1015   BUN 12 04/12/2014 1029   CREATININE 1.1 10/16/2014 0929   CREATININE 1.07 05/23/2014 1015   CREATININE 1.05 11/04/2012 1242      Component Value Date/Time   CALCIUM 9.3 10/16/2014 0929   CALCIUM 9.3 05/23/2014 1015   ALKPHOS 106 10/16/2014 0929   ALKPHOS 90 05/23/2014 1015   AST 11 10/16/2014 0929   AST 19 05/23/2014 1015   ALT 12 10/16/2014 0929   ALT 17 05/23/2014 1015    BILITOT 0.54 10/16/2014 0929   BILITOT 0.6 05/23/2014 1015       RADIOGRAPHIC STUDIES: No results found.  ASSESSMENT AND PLAN: This is a very pleasant 59 year old white male with history of stage II a non-small cell lung cancer status post a course of concurrent chemoradiation with weekly carboplatin and paclitaxel. The recent CT scan of the chest showed interval decrease in the size of the left hilar mass and adenopathy.  The patient was discussed with and also seen by Dr. Julien Nordmann. He is receiving further concurrent chemoradiation. He will proceed with week 2 today. He has one more week scheduled next week. We will see him back in 2 weeks and then order a restaging CT scan at that time.   He was advised to call immediately if he has any concerning symptoms in the interval. The patient was again encouraged to quit smoking and we offered him to smoke cessation program. The patient voices understanding of current disease status and treatment options and is in agreement with the current care plan.  All questions were answered. The  patient knows to call the clinic with any problems, questions or concerns. We can certainly see the patient much sooner if necessary.  Mikey Bussing, DNP, AGPCNP-BC, AOCNP 10/16/2014  ADDENDUM: Hematology/Oncology Attending:  I had a face to face encounter with the patient. I recommended his care plan. This is a very pleasant 59 years old white male with unresectable a stage II a non-small cell lung cancer who initially underwent a short course of concurrent chemoradiation followed by surgical evaluation but with his poor pulmonary function the patient was felt to be nonsurgical candidate for resection.  He is currently in resuming his concurrent chemoradiation with weekly carboplatin and paclitaxel is status post 1 week of treatment. The patient is feeling fine and tolerating his treatment fairly well.  I recommended for him to continue his current treatment as a  scheduled.  He would come back for follow-up visit in 2 weeks for reevaluation and management of any adverse effect of his treatment.  Disclaimer: This note was dictated with voice recognition software. Similar sounding words can inadvertently be transcribed and may be missed upon review. Eilleen Kempf., MD 10/18/2014

## 2014-10-17 ENCOUNTER — Telehealth: Payer: Self-pay | Admitting: Oncology

## 2014-10-17 NOTE — Telephone Encounter (Signed)
Advised pt that we didn't have anything any earlier that the time we have him scheduled for on 07/05, mailed out schedule to pt... KJ

## 2014-10-24 ENCOUNTER — Other Ambulatory Visit (HOSPITAL_BASED_OUTPATIENT_CLINIC_OR_DEPARTMENT_OTHER): Payer: Medicaid Other

## 2014-10-24 ENCOUNTER — Ambulatory Visit (HOSPITAL_BASED_OUTPATIENT_CLINIC_OR_DEPARTMENT_OTHER): Payer: Medicaid Other

## 2014-10-24 VITALS — BP 135/80 | HR 112 | Temp 98.7°F | Resp 20

## 2014-10-24 DIAGNOSIS — Z5111 Encounter for antineoplastic chemotherapy: Secondary | ICD-10-CM

## 2014-10-24 DIAGNOSIS — C3491 Malignant neoplasm of unspecified part of right bronchus or lung: Secondary | ICD-10-CM

## 2014-10-24 DIAGNOSIS — C3411 Malignant neoplasm of upper lobe, right bronchus or lung: Secondary | ICD-10-CM

## 2014-10-24 LAB — COMPREHENSIVE METABOLIC PANEL (CC13)
ALT: 12 U/L (ref 0–55)
AST: 12 U/L (ref 5–34)
Albumin: 3.3 g/dL — ABNORMAL LOW (ref 3.5–5.0)
Alkaline Phosphatase: 97 U/L (ref 40–150)
Anion Gap: 11 mEq/L (ref 3–11)
BUN: 11.9 mg/dL (ref 7.0–26.0)
CO2: 20 mEq/L — ABNORMAL LOW (ref 22–29)
Calcium: 9.5 mg/dL (ref 8.4–10.4)
Chloride: 103 mEq/L (ref 98–109)
Creatinine: 0.9 mg/dL (ref 0.7–1.3)
EGFR: 88 mL/min/{1.73_m2} — ABNORMAL LOW (ref >90)
Glucose: 235 mg/dl — ABNORMAL HIGH (ref 70–140)
POTASSIUM: 4.1 meq/L (ref 3.5–5.1)
Sodium: 135 mEq/L — ABNORMAL LOW (ref 136–145)
Total Bilirubin: 0.39 mg/dL (ref 0.20–1.20)
Total Protein: 6.9 g/dL (ref 6.4–8.3)

## 2014-10-24 LAB — CBC WITH DIFFERENTIAL/PLATELET
BASO%: 0.7 % (ref 0.0–2.0)
BASOS ABS: 0 10*3/uL (ref 0.0–0.1)
EOS ABS: 0 10*3/uL (ref 0.0–0.5)
EOS%: 0.8 % (ref 0.0–7.0)
HCT: 32.4 % — ABNORMAL LOW (ref 38.4–49.9)
HEMOGLOBIN: 11 g/dL — AB (ref 13.0–17.1)
LYMPH%: 6.9 % — AB (ref 14.0–49.0)
MCH: 33.2 pg (ref 27.2–33.4)
MCHC: 34.1 g/dL (ref 32.0–36.0)
MCV: 97.4 fL (ref 79.3–98.0)
MONO#: 0.5 10*3/uL (ref 0.1–0.9)
MONO%: 8.6 % (ref 0.0–14.0)
NEUT#: 4.7 10*3/uL (ref 1.5–6.5)
NEUT%: 83 % — AB (ref 39.0–75.0)
Platelets: 242 10*3/uL (ref 140–400)
RBC: 3.33 10*6/uL — AB (ref 4.20–5.82)
RDW: 13.3 % (ref 11.0–14.6)
WBC: 5.7 10*3/uL (ref 4.0–10.3)
lymph#: 0.4 10*3/uL — ABNORMAL LOW (ref 0.9–3.3)

## 2014-10-24 MED ORDER — DIPHENHYDRAMINE HCL 50 MG/ML IJ SOLN
50.0000 mg | Freq: Once | INTRAMUSCULAR | Status: AC
  Administered 2014-10-24: 50 mg via INTRAVENOUS

## 2014-10-24 MED ORDER — SODIUM CHLORIDE 0.9 % IV SOLN
Freq: Once | INTRAVENOUS | Status: AC
  Administered 2014-10-24: 15:00:00 via INTRAVENOUS
  Filled 2014-10-24: qty 8

## 2014-10-24 MED ORDER — SODIUM CHLORIDE 0.9 % IV SOLN
172.6000 mg | Freq: Once | INTRAVENOUS | Status: AC
  Administered 2014-10-24: 170 mg via INTRAVENOUS
  Filled 2014-10-24: qty 17

## 2014-10-24 MED ORDER — PACLITAXEL CHEMO INJECTION 300 MG/50ML
45.0000 mg/m2 | Freq: Once | INTRAVENOUS | Status: AC
  Administered 2014-10-24: 72 mg via INTRAVENOUS
  Filled 2014-10-24: qty 12

## 2014-10-24 MED ORDER — SODIUM CHLORIDE 0.9 % IV SOLN
Freq: Once | INTRAVENOUS | Status: AC
  Administered 2014-10-24: 15:00:00 via INTRAVENOUS

## 2014-10-24 MED ORDER — FAMOTIDINE IN NACL 20-0.9 MG/50ML-% IV SOLN
20.0000 mg | Freq: Once | INTRAVENOUS | Status: AC
  Administered 2014-10-24: 20 mg via INTRAVENOUS

## 2014-10-24 MED ORDER — FAMOTIDINE IN NACL 20-0.9 MG/50ML-% IV SOLN
INTRAVENOUS | Status: AC
  Filled 2014-10-24: qty 50

## 2014-10-24 MED ORDER — DIPHENHYDRAMINE HCL 50 MG/ML IJ SOLN
INTRAMUSCULAR | Status: AC
Start: 2014-10-24 — End: 2014-10-24
  Filled 2014-10-24: qty 1

## 2014-10-24 NOTE — Patient Instructions (Signed)
Cancer Center Discharge Instructions for Patients Receiving Chemotherapy  Today you received the following chemotherapy agents Taxol and Carboplatin  To help prevent nausea and vomiting after your treatment, we encourage you to take your nausea medication Compazine 10 mg every 6 hours as needed.   If you develop nausea and vomiting that is not controlled by your nausea medication, call the clinic.   BELOW ARE SYMPTOMS THAT SHOULD BE REPORTED IMMEDIATELY:  *FEVER GREATER THAN 100.5 F  *CHILLS WITH OR WITHOUT FEVER  NAUSEA AND VOMITING THAT IS NOT CONTROLLED WITH YOUR NAUSEA MEDICATION  *UNUSUAL SHORTNESS OF BREATH  *UNUSUAL BRUISING OR BLEEDING  TENDERNESS IN MOUTH AND THROAT WITH OR WITHOUT PRESENCE OF ULCERS  *URINARY PROBLEMS  *BOWEL PROBLEMS  UNUSUAL RASH Items with * indicate a potential emergency and should be followed up as soon as possible.  Feel free to call the clinic you have any questions or concerns. The clinic phone number is [phone].  Please show the CHEMO ALERT CARD at check-in to the Emergency Department and triage nurse.   

## 2014-10-30 ENCOUNTER — Ambulatory Visit (HOSPITAL_BASED_OUTPATIENT_CLINIC_OR_DEPARTMENT_OTHER): Payer: Medicaid Other | Admitting: Oncology

## 2014-10-30 ENCOUNTER — Encounter: Payer: Self-pay | Admitting: Oncology

## 2014-10-30 ENCOUNTER — Other Ambulatory Visit (HOSPITAL_BASED_OUTPATIENT_CLINIC_OR_DEPARTMENT_OTHER): Payer: Medicaid Other

## 2014-10-30 ENCOUNTER — Telehealth: Payer: Self-pay | Admitting: Internal Medicine

## 2014-10-30 VITALS — BP 112/46 | HR 77 | Temp 98.2°F | Resp 18 | Ht 65.0 in | Wt 113.4 lb

## 2014-10-30 DIAGNOSIS — C3411 Malignant neoplasm of upper lobe, right bronchus or lung: Secondary | ICD-10-CM

## 2014-10-30 DIAGNOSIS — C3490 Malignant neoplasm of unspecified part of unspecified bronchus or lung: Secondary | ICD-10-CM

## 2014-10-30 DIAGNOSIS — C3491 Malignant neoplasm of unspecified part of right bronchus or lung: Secondary | ICD-10-CM

## 2014-10-30 LAB — COMPREHENSIVE METABOLIC PANEL (CC13)
ALBUMIN: 3.3 g/dL — AB (ref 3.5–5.0)
ALT: 12 U/L (ref 0–55)
ANION GAP: 11 meq/L (ref 3–11)
AST: 13 U/L (ref 5–34)
Alkaline Phosphatase: 96 U/L (ref 40–150)
BUN: 19 mg/dL (ref 7.0–26.0)
CALCIUM: 9.3 mg/dL (ref 8.4–10.4)
CO2: 22 mEq/L (ref 22–29)
Chloride: 107 mEq/L (ref 98–109)
Creatinine: 1.1 mg/dL (ref 0.7–1.3)
EGFR: 71 mL/min/{1.73_m2} — ABNORMAL LOW (ref >90)
GLUCOSE: 198 mg/dL — AB (ref 70–140)
Potassium: 4 mEq/L (ref 3.5–5.1)
Sodium: 139 mEq/L (ref 136–145)
TOTAL PROTEIN: 6.9 g/dL (ref 6.4–8.3)
Total Bilirubin: 0.68 mg/dL (ref 0.20–1.20)

## 2014-10-30 LAB — CBC WITH DIFFERENTIAL/PLATELET
BASO%: 0.5 % (ref 0.0–2.0)
Basophils Absolute: 0 10*3/uL (ref 0.0–0.1)
EOS%: 1.2 % (ref 0.0–7.0)
Eosinophils Absolute: 0.1 10*3/uL (ref 0.0–0.5)
HEMATOCRIT: 32.5 % — AB (ref 38.4–49.9)
HGB: 11 g/dL — ABNORMAL LOW (ref 13.0–17.1)
LYMPH%: 12.6 % — AB (ref 14.0–49.0)
MCH: 32.5 pg (ref 27.2–33.4)
MCHC: 33.8 g/dL (ref 32.0–36.0)
MCV: 96.2 fL (ref 79.3–98.0)
MONO#: 0.4 10*3/uL (ref 0.1–0.9)
MONO%: 9.4 % (ref 0.0–14.0)
NEUT#: 3.1 10*3/uL (ref 1.5–6.5)
NEUT%: 76.3 % — ABNORMAL HIGH (ref 39.0–75.0)
PLATELETS: 167 10*3/uL (ref 140–400)
RBC: 3.38 10*6/uL — ABNORMAL LOW (ref 4.20–5.82)
RDW: 12.9 % (ref 11.0–14.6)
WBC: 4 10*3/uL (ref 4.0–10.3)
lymph#: 0.5 10*3/uL — ABNORMAL LOW (ref 0.9–3.3)

## 2014-10-30 NOTE — Progress Notes (Signed)
[facility] [phone]   Fax:(336) [place], [place] [address]  DIAGNOSIS: Unresectable a stage IIA (T1a, N1, M0) non-small cell lung cancer, invasive poorly differentiated carcinoma diagnosed in March 2016  PRIOR THERAPY: A course of concurrent chemoradiation with weekly carboplatin for AUC of 2 and paclitaxel 45 MG/M2  CURRENT THERAPY: concurrent chemoradiation with weekly carboplatin for AUC of 2 and paclitaxel 45 mg/m2 resumed on 10/09/14. Completed his last dose on 10/24/14.  INTERVAL HISTORY: Adam Warner 59 y.o. male returns to the clinic today for follow-up visit. The patient is feeling fine today was no specific complaints except for shortness breath with exertion. He was deemed not a good surgical candidate by Dr. Roxan Hockey due to the right hilar node completely encasing the posterior descending pulmonary artery. This could not be managed by a lobectomy and would require a pneumonectomy and the patient is not a good candidate for this procedure. He restarted radiation concurrent with chemotherapy. He received his 3rd planned dose of chemo last week. Last XRT is today which has been delayed due to issues with the radiation machine. He denied having any significant chest pain, cough or hemoptysis. He denied having any significant nausea or vomiting, no fever or chills.   MEDICAL HISTORY: Past Medical History  Diagnosis Date  . COPD (chronic obstructive pulmonary disease)   . Stroke   . Hypertension   . Heart attack 1991  . Pulmonary nodule   . Coronary artery disease     sees Dr Spero Curb at [facility] every 2-3 years  . Shortness of breath dyspnea   . Diabetes mellitus     Type Niddm x 2 years  . Full dentures     ALLERGIES:  is allergic to zyban.  MEDICATIONS:  Current Outpatient Prescriptions  Medication Sig Dispense Refill  . ACCU-CHEK AVIVA PLUS test strip USE TO CHECK ONCE OR  TWICE DAILY 100 each 0  . ACCU-CHEK FASTCLIX LANCETS MISC Check blood sugar bid and prn 102 each 0  . aspirin EC 325 MG tablet Take 325 mg by mouth every morning. Hold for surgery    . atorvastatin (LIPITOR) 40 MG tablet TAKE ONE TABLET BY MOUTH ONE TIME DAILY 30 tablet 6  . Blood Glucose Monitoring Suppl (ONE TOUCH ULTRA 2) W/DEVICE KIT Use monitor to test blood sugar qd Dx 250.00 1 each 0  . Cholecalciferol (VITAMIN D) 2000 UNITS CAPS Take 1 capsule by mouth daily.    . feeding supplement, GLUCERNA SHAKE, (GLUCERNA SHAKE) LIQD Take 237 mLs by mouth 2 (two) times daily between meals. 60 Can 5  . metFORMIN (GLUCOPHAGE) 500 MG tablet Take 1 tablet (500 mg total) by mouth 2 (two) times daily with a meal. 60 tablet 2  . prochlorperazine (COMPAZINE) 10 MG tablet Take 1 tablet (10 mg total) by mouth every 6 (six) hours as needed for nausea or vomiting. 30 tablet 0  . tiotropium (SPIRIVA) 18 MCG inhalation capsule Place 1 capsule (18 mcg total) into inhaler and inhale daily. 30 capsule 6  . valsartan (DIOVAN) 320 MG tablet Take 1 tablet (320 mg total) by mouth daily. 30 tablet 6   No current facility-administered medications for this visit.    SURGICAL HISTORY:  Past Surgical History  Procedure Laterality Date  . Lung surgery      Dr Arlyce Dice  . Angioplasty    . Colonoscopy  10/22/2011    Procedure:  COLONOSCOPY;  Surgeon: Rogene Houston, MD;  [facility];  Service: Endoscopy;  Laterality: N/A;  1200  . Carotid endarterectomy Right   . Video bronchoscopy with endobronchial ultrasound N/A 05/29/2014    Procedure: VIDEO BRONCHOSCOPY WITH ENDOBRONCHIAL ULTRASOUND;  Surgeon: Melrose Nakayama, MD;  Location: [facility];  Service: Thoracic;  Laterality: N/A;    REVIEW OF SYSTEMS:  Constitutional: negative Eyes: negative Ears, nose, mouth, throat, and face: negative Respiratory: positive for dyspnea on exertion Cardiovascular: negative Gastrointestinal:  negative Genitourinary:negative Integument/breast: negative Hematologic/lymphatic: negative Musculoskeletal:negative Neurological: negative Behavioral/Psych: negative Endocrine: negative Allergic/Immunologic: negative   PHYSICAL EXAMINATION: General appearance: alert, cooperative and no distress Head: Normocephalic, without obvious abnormality, atraumatic Neck: no adenopathy, no JVD, supple, symmetrical, trachea midline and thyroid not enlarged, symmetric, no tenderness/mass/nodules Lymph nodes: Cervical, supraclavicular, and axillary nodes normal. Resp: clear to auscultation bilaterally Back: symmetric, no curvature. ROM normal. No CVA tenderness. Cardio: regular rate and rhythm, S1, S2 normal, no murmur, click, rub or gallop GI: soft, non-tender; bowel sounds normal; no masses,  no organomegaly Extremities: extremities normal, atraumatic, no cyanosis or edema Neurologic: Alert and oriented X 3, normal strength and tone. Normal symmetric reflexes. Normal coordination and gait  ECOG PERFORMANCE STATUS: 1 - Symptomatic but completely ambulatory  Blood pressure 112/46, pulse 77, temperature 98.2 F (36.8 C), temperature source Oral, resp. rate 18, height $RemoveBe'5\' 5"'gnnXGEhyo$  (1.651 m), weight 113 lb 6.4 oz (51.438 kg), SpO2 100 %.  LABORATORY DATA: Lab Results  Component Value Date   WBC 4.0 10/30/2014   HGB 11.0* 10/30/2014   HCT 32.5* 10/30/2014   MCV 96.2 10/30/2014   PLT 167 10/30/2014      Chemistry      Component Value Date/Time   NA 139 10/30/2014 0950   NA 136 05/23/2014 1015   NA 139 04/12/2014 1029   K 4.0 10/30/2014 0950   K 4.1 05/23/2014 1015   CL 106 05/23/2014 1015   CO2 22 10/30/2014 0950   CO2 25 05/23/2014 1015   BUN 19.0 10/30/2014 0950   BUN 10 05/23/2014 1015   BUN 12 04/12/2014 1029   CREATININE 1.1 10/30/2014 0950   CREATININE 1.07 05/23/2014 1015   CREATININE 1.05 11/04/2012 1242      Component Value Date/Time   CALCIUM 9.3 10/30/2014 0950   CALCIUM 9.3  05/23/2014 1015   ALKPHOS 96 10/30/2014 0950   ALKPHOS 90 05/23/2014 1015   AST 13 10/30/2014 0950   AST 19 05/23/2014 1015   ALT 12 10/30/2014 0950   ALT 17 05/23/2014 1015   BILITOT 0.68 10/30/2014 0950   BILITOT 0.6 05/23/2014 1015       RADIOGRAPHIC STUDIES: No results found.  ASSESSMENT AND PLAN: This is a very pleasant 59 year old white male with history of stage II a non-small cell lung cancer status post a course of concurrent chemoradiation with weekly carboplatin and paclitaxel. The recent CT scan of the chest showed interval decrease in the size of the left hilar mass and adenopathy.  The patient was discussed with and also seen by Dr. Julien Nordmann. He received further concurrent chemoradiation. Has completed all 3 planned doses. Will plan to see him back in about 1 month with a restaging CT prior to this visit.  He was advised to call immediately if he has any concerning symptoms in the interval. The patient was again encouraged to quit smoking and we offered him to smoke cessation program. The patient voices understanding of current disease status and  treatment options and is in agreement with the current care plan.  All questions were answered. The patient knows to call the clinic with any problems, questions or concerns. We can certainly see the patient much sooner if necessary.  Mikey Bussing, DNP, AGPCNP-BC, AOCNP 10/30/2014  ADDENDUM: Hematology/Oncology Attending: I had a face to face encounter with the patient today. I recommended his care plan. This is a very pleasant 59 years old white male with unresectable a stage IIA non-small cell lung cancer who completed the remaining portion of his concurrent chemoradiation today. He tolerated the course of the treatment fairly well with no significant adverse effects. We will arrange for the patient to have repeat CT scan of the chest for restaging of his disease in one month's and he would come back for follow-up visit at  that time. He was advised to call immediately if he has any concerning symptoms in the interval.  Disclaimer: This note was dictated with voice recognition software. Similar sounding words can inadvertently be transcribed and may be missed upon review. Eilleen Kempf., MD 10/30/2014

## 2014-10-30 NOTE — Telephone Encounter (Signed)
s.w. pt and advised on Aug appt....pt ok and aware °

## 2014-11-27 ENCOUNTER — Encounter (HOSPITAL_COMMUNITY): Payer: Self-pay

## 2014-11-27 ENCOUNTER — Telehealth: Payer: Self-pay | Admitting: *Deleted

## 2014-11-27 ENCOUNTER — Ambulatory Visit (HOSPITAL_COMMUNITY)
Admission: RE | Admit: 2014-11-27 | Discharge: 2014-11-27 | Disposition: A | Discharge status: Home / Self Care | Payer: Medicaid Other | Source: Ambulatory Visit | Attending: Oncology | Admitting: Oncology

## 2014-11-27 ENCOUNTER — Other Ambulatory Visit (HOSPITAL_BASED_OUTPATIENT_CLINIC_OR_DEPARTMENT_OTHER): Payer: Medicaid Other

## 2014-11-27 DIAGNOSIS — C3490 Malignant neoplasm of unspecified part of unspecified bronchus or lung: Secondary | ICD-10-CM | POA: Diagnosis present

## 2014-11-27 DIAGNOSIS — Z902 Acquired absence of lung [part of]: Secondary | ICD-10-CM | POA: Diagnosis not present

## 2014-11-27 DIAGNOSIS — K769 Liver disease, unspecified: Secondary | ICD-10-CM | POA: Diagnosis not present

## 2014-11-27 DIAGNOSIS — R590 Localized enlarged lymph nodes: Secondary | ICD-10-CM | POA: Insufficient documentation

## 2014-11-27 DIAGNOSIS — M899 Disorder of bone, unspecified: Secondary | ICD-10-CM | POA: Insufficient documentation

## 2014-11-27 DIAGNOSIS — C3411 Malignant neoplasm of upper lobe, right bronchus or lung: Secondary | ICD-10-CM | POA: Diagnosis not present

## 2014-11-27 DIAGNOSIS — R0602 Shortness of breath: Secondary | ICD-10-CM | POA: Diagnosis not present

## 2014-11-27 DIAGNOSIS — R634 Abnormal weight loss: Secondary | ICD-10-CM | POA: Diagnosis not present

## 2014-11-27 DIAGNOSIS — C3491 Malignant neoplasm of unspecified part of right bronchus or lung: Secondary | ICD-10-CM

## 2014-11-27 LAB — COMPREHENSIVE METABOLIC PANEL (CC13)
ALBUMIN: 3 g/dL — AB (ref 3.5–5.0)
ALK PHOS: 110 U/L (ref 40–150)
ALT: 8 U/L (ref 0–55)
ANION GAP: 8 meq/L (ref 3–11)
AST: 10 U/L (ref 5–34)
BUN: 13.5 mg/dL (ref 7.0–26.0)
CALCIUM: 9.6 mg/dL (ref 8.4–10.4)
CO2: 25 mEq/L (ref 22–29)
Chloride: 107 mEq/L (ref 98–109)
Creatinine: 1 mg/dL (ref 0.7–1.3)
EGFR: 80 mL/min/{1.73_m2} — ABNORMAL LOW (ref >90)
Glucose: 187 mg/dl — ABNORMAL HIGH (ref 70–140)
Potassium: 4.6 mEq/L (ref 3.5–5.1)
SODIUM: 139 meq/L (ref 136–145)
TOTAL PROTEIN: 7.1 g/dL (ref 6.4–8.3)
Total Bilirubin: 0.53 mg/dL (ref 0.20–1.20)

## 2014-11-27 LAB — CBC WITH DIFFERENTIAL/PLATELET
BASO%: 0.5 % (ref 0.0–2.0)
Basophils Absolute: 0 10*3/uL (ref 0.0–0.1)
EOS ABS: 0 10*3/uL (ref 0.0–0.5)
EOS%: 0.7 % (ref 0.0–7.0)
HCT: 32.3 % — ABNORMAL LOW (ref 38.4–49.9)
HGB: 10.9 g/dL — ABNORMAL LOW (ref 13.0–17.1)
LYMPH%: 8.4 % — ABNORMAL LOW (ref 14.0–49.0)
MCH: 31.8 pg (ref 27.2–33.4)
MCHC: 33.7 g/dL (ref 32.0–36.0)
MCV: 94.2 fL (ref 79.3–98.0)
MONO#: 0.6 10*3/uL (ref 0.1–0.9)
MONO%: 14.7 % — ABNORMAL HIGH (ref 0.0–14.0)
NEUT%: 75.7 % — AB (ref 39.0–75.0)
NEUTROS ABS: 3.3 10*3/uL (ref 1.5–6.5)
Platelets: 255 10*3/uL (ref 140–400)
RBC: 3.43 10*6/uL — ABNORMAL LOW (ref 4.20–5.82)
RDW: 14 % (ref 11.0–14.6)
WBC: 4.3 10*3/uL (ref 4.0–10.3)
lymph#: 0.4 10*3/uL — ABNORMAL LOW (ref 0.9–3.3)

## 2014-11-27 MED ORDER — IOHEXOL 300 MG/ML  SOLN
75.0000 mL | Freq: Once | INTRAMUSCULAR | Status: AC | PRN
  Administered 2014-11-27: 75 mL via INTRAVENOUS

## 2014-11-27 NOTE — Telephone Encounter (Signed)
Voicemail received to call for today's results of CT chest or let [facility] know if we've received report.  Her are results for provider and will notify Duanne Guess it has been received.   IMPRESSION: 1. Interval worsening of interstitial opacity in the right lung, mainly anteriorly. This is associated with a new tiny right pleural effusion. These changes may be related to infection, but 2 per progression could also have this appearance. 2. Interval development of a lytic bone lesion in the T11 vertebral body, compatible with bony metastatic involvement. 3. Interval development of a 2.5 cm left liver lesion, highly suspicious for metastatic disease. Other scattered smaller liver lesions also raise concern for metastases. 4. New gastrohepatic and hepatoduodenal ligament lymphadenopathy concerning for interval development of metastatic involvement of upper abdominal lymph nodes. These results will be called to the ordering clinician or representative by the Radiologist Assistant, and communication documented in the PACS or zVision Dashboard.

## 2014-11-28 ENCOUNTER — Other Ambulatory Visit: Payer: Self-pay | Admitting: *Deleted

## 2014-12-01 ENCOUNTER — Telehealth: Payer: Self-pay | Admitting: *Deleted

## 2014-12-01 ENCOUNTER — Telehealth: Payer: Self-pay | Admitting: Physician Assistant

## 2014-12-01 ENCOUNTER — Other Ambulatory Visit: Payer: Self-pay | Admitting: *Deleted

## 2014-12-01 ENCOUNTER — Telehealth: Payer: Self-pay | Admitting: Internal Medicine

## 2014-12-01 NOTE — Telephone Encounter (Signed)
POF sent for appt next week

## 2014-12-01 NOTE — Telephone Encounter (Signed)
Called patient and he is aware of his new appointment

## 2014-12-01 NOTE — Telephone Encounter (Signed)
He sees Dr. Sondra Come in [place]. He needs appointment with me or APP here in one week

## 2014-12-01 NOTE — Telephone Encounter (Signed)
8/12 pof noted and patient already has an appointment in place 8/15   [facility]

## 2014-12-01 NOTE — Telephone Encounter (Signed)
TC from patient stating that he is having back pain-has been having it for > 2 weeks. 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 He has tried Tylenol without any relief. He also states he saw Dr. Sondra Come recently and it to begin radiation treatments on Monday. Did not see any appts in his chart. He states he has an appt @ 10:15 Monday @ another location for this.  Pt rates his pain @ 5-6 out of 10

## 2014-12-04 ENCOUNTER — Ambulatory Visit: Payer: Medicaid Other | Admitting: Internal Medicine

## 2014-12-04 ENCOUNTER — Telehealth: Payer: Self-pay | Admitting: Physician Assistant

## 2014-12-04 NOTE — Telephone Encounter (Signed)
Patient request to come in sooner due to an appointment conflict   Adam Warner

## 2014-12-06 ENCOUNTER — Telehealth: Payer: Self-pay | Admitting: Internal Medicine

## 2014-12-06 ENCOUNTER — Encounter: Payer: Self-pay | Admitting: Physician Assistant

## 2014-12-06 ENCOUNTER — Telehealth: Payer: Self-pay | Admitting: *Deleted

## 2014-12-06 ENCOUNTER — Ambulatory Visit (HOSPITAL_BASED_OUTPATIENT_CLINIC_OR_DEPARTMENT_OTHER): Payer: Medicaid Other | Admitting: Physician Assistant

## 2014-12-06 VITALS — BP 110/67 | HR 106 | Temp 98.5°F | Resp 18 | Ht 65.0 in | Wt 111.9 lb

## 2014-12-06 DIAGNOSIS — C3411 Malignant neoplasm of upper lobe, right bronchus or lung: Secondary | ICD-10-CM | POA: Diagnosis present

## 2014-12-06 DIAGNOSIS — C3490 Malignant neoplasm of unspecified part of unspecified bronchus or lung: Secondary | ICD-10-CM

## 2014-12-06 DIAGNOSIS — C7951 Secondary malignant neoplasm of bone: Secondary | ICD-10-CM | POA: Diagnosis not present

## 2014-12-06 MED ORDER — OXYCODONE-ACETAMINOPHEN 5-325 MG PO TABS: 1.0000 | ORAL_TABLET | Freq: Four times a day (QID) | ORAL | Status: DC | PRN

## 2014-12-06 NOTE — Telephone Encounter (Signed)
Pt confirmed labs/ov per 08/17 POF, gave pt avs and calendar.Cherylann Banas, sent msg to add chemo

## 2014-12-06 NOTE — Progress Notes (Addendum)
[facility] [place] [phone]   Fax:(336) [place], [place] [address]  DIAGNOSIS: Metastatic non-small cell lung cancer initially diagnosed as Unresectable a stage IIA (T1a, N1, M0) non-small cell lung cancer, invasive poorly differentiated carcinoma diagnosed in March 2016  PRIOR THERAPY:  1) A course of concurrent chemoradiation with weekly carboplatin for AUC of 2 and paclitaxel 45 MG/M2. 2) concurrent chemoradiation with weekly carboplatin for AUC of 2 and paclitaxel 45 mg/m2 resumed on 10/09/14. Completed his last dose on 10/24/14.  CURRENT THERAPY: Systemic chemotherapy with carboplatin for AUC of 5 on day 1 and gemcitabine 1000 MG/M2 on days 1 and a every 3 weeks. First dose on 12/12/2014.  INTERVAL HISTORY: Adam Warner 59 y.o. male returns to the clinic today for follow-up visit. He is status post a course of concurrent chemoradiation. He recently had a restaging CT scan of the chest to reevaluate his disease and presents to discuss the results. He does report some back pain. He is currently undergoing radiation therapy for T11 bone metastatic lesion. He receives his radiation therapy in [facility]. The patient is feeling fine today was no specific complaints except for shortness breath with exertion.  He denied having any significant chest pain, cough or hemoptysis. He denied having any significant nausea or vomiting, no fever or chills.   MEDICAL HISTORY: Past Medical History  Diagnosis Date  . COPD (chronic obstructive pulmonary disease)   . Stroke   . Hypertension   . Heart attack 1991  . Pulmonary nodule   . Coronary artery disease     sees Dr Spero Curb at [facility] every 2-3 years  . Shortness of breath dyspnea   . Full dentures   . Diabetes mellitus     Type Niddm x 2 years    ALLERGIES:  is allergic to zyban.  MEDICATIONS:  Current Outpatient Prescriptions  Medication Sig  Dispense Refill  . ACCU-CHEK AVIVA PLUS test strip USE TO CHECK ONCE OR TWICE DAILY 100 each 0  . ACCU-CHEK FASTCLIX LANCETS MISC Check blood sugar bid and prn 102 each 0  . aspirin EC 325 MG tablet Take 325 mg by mouth every morning. Hold for surgery    . atorvastatin (LIPITOR) 40 MG tablet TAKE ONE TABLET BY MOUTH ONE TIME DAILY 30 tablet 6  . Blood Glucose Monitoring Suppl (ONE TOUCH ULTRA 2) W/DEVICE KIT Use monitor to test blood sugar qd Dx 250.00 1 each 0  . Cholecalciferol (VITAMIN D) 2000 UNITS CAPS Take 1 capsule by mouth daily.    . feeding supplement, GLUCERNA SHAKE, (GLUCERNA SHAKE) LIQD Take 237 mLs by mouth 2 (two) times daily between meals. 60 Can 5  . metFORMIN (GLUCOPHAGE) 500 MG tablet Take 1 tablet (500 mg total) by mouth 2 (two) times daily with a meal. 60 tablet 2  . oxyCODONE-acetaminophen (PERCOCET/ROXICET) 5-325 MG per tablet Take 1 tablet by mouth every 6 (six) hours as needed for moderate pain or severe pain. 30 tablet 0  . prochlorperazine (COMPAZINE) 10 MG tablet Take 1 tablet (10 mg total) by mouth every 6 (six) hours as needed for nausea or vomiting. 30 tablet 0  . tiotropium (SPIRIVA) 18 MCG inhalation capsule Place 1 capsule (18 mcg total) into inhaler and inhale daily. 30 capsule 6  . valsartan (DIOVAN) 320 MG tablet Take 1 tablet (320 mg total) by mouth daily. 30 tablet 6   No current  facility-administered medications for this visit.    SURGICAL HISTORY:  Past Surgical History  Procedure Laterality Date  . Lung surgery      Dr Arlyce Dice  . Angioplasty    . Colonoscopy  10/22/2011    Procedure: COLONOSCOPY;  Surgeon: Rogene Houston, MD;  Location: [facility];  Service: Endoscopy;  Laterality: N/A;  1200  . Carotid endarterectomy Right   . Video bronchoscopy with endobronchial ultrasound N/A 05/29/2014    Procedure: VIDEO BRONCHOSCOPY WITH ENDOBRONCHIAL ULTRASOUND;  Surgeon: Melrose Nakayama, MD;  Location: [facility];  Service: Thoracic;  Laterality: N/A;     REVIEW OF SYSTEMS:  Constitutional: negative Eyes: negative Ears, nose, mouth, throat, and face: negative Respiratory: positive for dyspnea on exertion Cardiovascular: negative Gastrointestinal: negative Genitourinary:negative Integument/breast: negative Hematologic/lymphatic: negative Musculoskeletal:positive for back pain Neurological: negative Behavioral/Psych: negative Endocrine: negative Allergic/Immunologic: negative   PHYSICAL EXAMINATION: General appearance: alert, cooperative and no distress Head: Normocephalic, without obvious abnormality, atraumatic Neck: no adenopathy, no JVD, supple, symmetrical, trachea midline and thyroid not enlarged, symmetric, no tenderness/mass/nodules Lymph nodes: Cervical, supraclavicular, and axillary nodes normal. Resp: clear to auscultation bilaterally Back: symmetric, no curvature. ROM normal. No CVA tenderness. Cardio: regular rate and rhythm, S1, S2 normal, no murmur, click, rub or gallop GI: soft, non-tender; bowel sounds normal; no masses,  no organomegaly Extremities: extremities normal, atraumatic, no cyanosis or edema Neurologic: Alert and oriented X 3, normal strength and tone. Normal symmetric reflexes. Normal coordination and gait  ECOG PERFORMANCE STATUS: 1 - Symptomatic but completely ambulatory  Blood pressure 110/67, pulse 106, temperature 98.5 F (36.9 C), temperature source Oral, resp. rate 18, height $RemoveBe'5\' 5"'HcpJzkvoQ$  (1.651 m), weight 111 lb 14.4 oz (50.758 kg), SpO2 98 %.  LABORATORY DATA: Lab Results  Component Value Date   WBC 4.3 11/27/2014   HGB 10.9* 11/27/2014   HCT 32.3* 11/27/2014   MCV 94.2 11/27/2014   PLT 255 11/27/2014      Chemistry      Component Value Date/Time   NA 139 11/27/2014 0927   NA 136 05/23/2014 1015   NA 139 04/12/2014 1029   K 4.6 11/27/2014 0927   K 4.1 05/23/2014 1015   CL 106 05/23/2014 1015   CO2 25 11/27/2014 0927   CO2 25 05/23/2014 1015   BUN 13.5 11/27/2014 0927   BUN 10  05/23/2014 1015   BUN 12 04/12/2014 1029   CREATININE 1.0 11/27/2014 0927   CREATININE 1.07 05/23/2014 1015   CREATININE 1.05 11/04/2012 1242      Component Value Date/Time   CALCIUM 9.6 11/27/2014 0927   CALCIUM 9.3 05/23/2014 1015   ALKPHOS 110 11/27/2014 0927   ALKPHOS 90 05/23/2014 1015   AST 10 11/27/2014 0927   AST 19 05/23/2014 1015   ALT 8 11/27/2014 0927   ALT 17 05/23/2014 1015   BILITOT 0.53 11/27/2014 0927   BILITOT 0.6 05/23/2014 1015       RADIOGRAPHIC STUDIES: Ct Chest W Contrast  11/27/2014   CLINICAL DATA:  Subsequent encounter for non-small-cell lung cancer. Status post left upper lobectomy. Weight loss with shortness of breath on exertion.  EXAM: CT CHEST WITH CONTRAST  TECHNIQUE: Multidetector CT imaging of the chest was performed during intravenous contrast administration.  CONTRAST:  42mL OMNIPAQUE IOHEXOL 300 MG/ML  SOLN  COMPARISON:  09/11/2014  FINDINGS: Mediastinum / Lymph Nodes: There is no axillary lymphadenopathy. Right hilar lesion measured previously at 2.3 x 2.2 cm now measures 2.5 x 2.1 cm. Inferior right hilar lymph  node measured previously at 7 mm short axis is stable at 7 mm. Small paratracheal and subcarinal lymph nodes are stable and do not meet CT criteria for lymphadenopathy. Heart size is normal. Trace anterior pericardial fluid is slightly progressed in the interval. Coronary artery calcification is evident.  Lungs / Pleura: Subpleural reticulation in the lungs and new is slightly more advanced on the right since the prior study. Surgical suture line is seen in the left lung, as before. 10 mm anterior right upper lobe nodule measured previously has resolved in the interval. 8 mm peripheral right lung nodule seen on image 28 of series 5 today is stable. Tiny right pleural effusion is new in the interval.  MSK / Soft Tissues: Lytic lesion in the right aspect of the T11 vertebral body is new in the interval. This lesion measures approximately 2.6 cm.   Upper Abdomen: 2.5 cm lesion in the lateral segment left liver is new in the interval. Other scattered very tiny low-density liver lesions also appear new. 14 mm gastrohepatic ligament lymph node seen on image 49 series 2 is new since the prior study. 14 mm short axis hepatoduodenal ligament lymph node on image 54 series 2 is new since the prior study.  IMPRESSION: 1. Interval worsening of interstitial opacity in the right lung, mainly anteriorly. This is associated with a new tiny right pleural effusion. These changes may be related to infection, but 2 per progression could also have this appearance. 2. Interval development of a lytic bone lesion in the T11 vertebral body, compatible with bony metastatic involvement. 3. Interval development of a 2.5 cm left liver lesion, highly suspicious for metastatic disease. Other scattered smaller liver lesions also raise concern for metastases. 4. New gastrohepatic and hepatoduodenal ligament lymphadenopathy concerning for interval development of metastatic involvement of upper abdominal lymph nodes. These results will be called to the ordering clinician or representative by the Radiologist Assistant, and communication documented in the PACS or zVision Dashboard.   Electronically Signed   By: Misty Stanley M.D.   On: 11/27/2014 11:39    ASSESSMENT AND PLAN: This is a very pleasant 59 year old white male with history of stage II a non-small cell lung cancer status post a course of concurrent chemoradiation with weekly carboplatin and paclitaxel. The recent CT scan of the chest showed interval decrease in the size of the left hilar mass and adenopathy.  The patient was discussed with and also seen by Dr. Julien Nordmann. He has a recent restaging CT scan of the chest with contrast revealed evidence for disease progression with the new bone metastasis at T11 as well as development of a 2.5 cm left liver lesion is suspicious for metastatic disease, additionally there were other  smaller liver lesions that were concerning for metastatic disease. There was new gastrohepatic and hepatoduodenal ligament lymphadenopathy. He is currently undergoing radiation therapy to the T11 metastatic bone lesion in [facility]. Patient will be started on systemic chemotherapy with carboplatin and gemcitabine given on days 1 and 8 every 3 weeks. Should he have further disease progression after 3 cycles of this chemotherapy we will consider changing him to immunotherapy with nivoulumab. He was advised to call immediately if he has any concerning symptoms in the interval. The patient was again encouraged to quit smoking and we offered him to smoke cessation program. The patient voices understanding of current disease status and treatment options and is in agreement with the current care plan.  All questions were answered. The  patient knows to call the clinic with any problems, questions or concerns. We can certainly see the patient much sooner if necessary.  Carlton Adam, PA-C  12/06/2014  ADDENDUM: Hematology/Oncology Attending: I had a face to face encounter with the patient. I recommended his care plan. This is a very pleasant 59 years old white male with metastatic non-small cell lung cancer initially diagnosed as unresectable a stage IIA status post a course of concurrent chemoradiation. Unfortunately the recent CT scan of the chest showed evidence for disease progression with development of lytic bone lesions in the T11 vertebral body compatible with bony metastatic involvement as well as new 2.5 cm left liver lesion and a new gastrohepatic and hepatoduodenal ligament lymphadenopathy. I had a lengthy discussion with the patient today about his current condition and treatment options. I gave the patient the option of palliative care and hospice referral versus consideration of systemic chemotherapy with carboplatin for AUC of 5 on day 1 and gemcitabine 1000 MG/M2 on days 1 and 8  every 3 weeks. The patient is interested in proceeding with systemic chemotherapy. I discussed with him the adverse effect of this treatment including but not limited to alopecia, myelosuppression, nausea and vomiting, peripheral neuropathy, liver or renal dysfunction. The patient is expected to start the first dose of this treatment on 12/12/2014 He would come back for follow-up visit in 2 weeks for reevaluation and management of any adverse effect of his treatment. The patient was advised to call immediately if he has any concerning symptoms in the interval.  Disclaimer: This note was dictated with voice recognition software. Similar sounding words can inadvertently be transcribed and may be missed upon review. Eilleen Kempf., MD 12/12/2014

## 2014-12-06 NOTE — Telephone Encounter (Signed)
Per staff message and POF I have scheduled appts. Advised scheduler of appts. JMW  

## 2014-12-08 ENCOUNTER — Ambulatory Visit: Payer: Medicaid Other | Admitting: Physician Assistant

## 2014-12-11 ENCOUNTER — Telehealth: Payer: Self-pay | Admitting: Internal Medicine

## 2014-12-11 NOTE — Telephone Encounter (Signed)
Lft msg with lady of the house confirming labs moved closed to chemo for 08/23 .... Cherylann Banas

## 2014-12-12 ENCOUNTER — Other Ambulatory Visit: Payer: Self-pay | Admitting: Internal Medicine

## 2014-12-12 ENCOUNTER — Ambulatory Visit (HOSPITAL_BASED_OUTPATIENT_CLINIC_OR_DEPARTMENT_OTHER): Payer: Medicaid Other

## 2014-12-12 ENCOUNTER — Other Ambulatory Visit (HOSPITAL_BASED_OUTPATIENT_CLINIC_OR_DEPARTMENT_OTHER): Payer: Medicaid Other

## 2014-12-12 VITALS — BP 103/69 | HR 110 | Temp 97.8°F

## 2014-12-12 DIAGNOSIS — Z5111 Encounter for antineoplastic chemotherapy: Secondary | ICD-10-CM

## 2014-12-12 DIAGNOSIS — C3491 Malignant neoplasm of unspecified part of right bronchus or lung: Secondary | ICD-10-CM

## 2014-12-12 DIAGNOSIS — C3411 Malignant neoplasm of upper lobe, right bronchus or lung: Secondary | ICD-10-CM

## 2014-12-12 DIAGNOSIS — C3490 Malignant neoplasm of unspecified part of unspecified bronchus or lung: Secondary | ICD-10-CM

## 2014-12-12 LAB — COMPREHENSIVE METABOLIC PANEL (CC13)
ALBUMIN: 2.8 g/dL — AB (ref 3.5–5.0)
ALT: 7 U/L (ref 0–55)
AST: 9 U/L (ref 5–34)
Alkaline Phosphatase: 116 U/L (ref 40–150)
Anion Gap: 12 mEq/L — ABNORMAL HIGH (ref 3–11)
BUN: 11.3 mg/dL (ref 7.0–26.0)
CO2: 23 mEq/L (ref 22–29)
Calcium: 9.5 mg/dL (ref 8.4–10.4)
Chloride: 105 mEq/L (ref 98–109)
Creatinine: 1.2 mg/dL (ref 0.7–1.3)
EGFR: 66 mL/min/{1.73_m2} — AB (ref >90)
Glucose: 316 mg/dl — ABNORMAL HIGH (ref 70–140)
Potassium: 4.4 mEq/L (ref 3.5–5.1)
SODIUM: 140 meq/L (ref 136–145)
TOTAL PROTEIN: 6.9 g/dL (ref 6.4–8.3)
Total Bilirubin: 0.5 mg/dL (ref 0.20–1.20)

## 2014-12-12 LAB — CBC WITH DIFFERENTIAL/PLATELET
BASO%: 0.5 % (ref 0.0–2.0)
Basophils Absolute: 0 10*3/uL (ref 0.0–0.1)
EOS%: 1.1 % (ref 0.0–7.0)
Eosinophils Absolute: 0.1 10*3/uL (ref 0.0–0.5)
HCT: 32.7 % — ABNORMAL LOW (ref 38.4–49.9)
HGB: 10.9 g/dL — ABNORMAL LOW (ref 13.0–17.1)
LYMPH%: 4.7 % — AB (ref 14.0–49.0)
MCH: 31.2 pg (ref 27.2–33.4)
MCHC: 33.3 g/dL (ref 32.0–36.0)
MCV: 93.6 fL (ref 79.3–98.0)
MONO#: 0.6 10*3/uL (ref 0.1–0.9)
MONO%: 10.6 % (ref 0.0–14.0)
NEUT%: 83.1 % — ABNORMAL HIGH (ref 39.0–75.0)
NEUTROS ABS: 4.7 10*3/uL (ref 1.5–6.5)
Platelets: 337 10*3/uL (ref 140–400)
RBC: 3.5 10*6/uL — ABNORMAL LOW (ref 4.20–5.82)
RDW: 14.2 % (ref 11.0–14.6)
WBC: 5.6 10*3/uL (ref 4.0–10.3)
lymph#: 0.3 10*3/uL — ABNORMAL LOW (ref 0.9–3.3)

## 2014-12-12 MED ORDER — CARBOPLATIN CHEMO INJECTION 450 MG/45ML
360.0000 mg | Freq: Once | INTRAVENOUS | Status: AC
  Administered 2014-12-12: 360 mg via INTRAVENOUS
  Filled 2014-12-12: qty 36

## 2014-12-12 MED ORDER — DEXAMETHASONE SODIUM PHOSPHATE 100 MG/10ML IJ SOLN
Freq: Once | INTRAMUSCULAR | Status: AC
  Administered 2014-12-12: 14:00:00 via INTRAVENOUS
  Filled 2014-12-12: qty 8

## 2014-12-12 MED ORDER — SODIUM CHLORIDE 0.9 % IV SOLN
Freq: Once | INTRAVENOUS | Status: AC
  Administered 2014-12-12: 14:00:00 via INTRAVENOUS

## 2014-12-12 MED ORDER — GEMCITABINE HCL CHEMO INJECTION 1 GM/26.3ML
1000.0000 mg/m2 | Freq: Once | INTRAVENOUS | Status: AC
  Administered 2014-12-12: 1520 mg via INTRAVENOUS
  Filled 2014-12-12: qty 39.98

## 2014-12-12 NOTE — Patient Instructions (Signed)
[facility] Discharge Instructions for Patients Receiving Chemotherapy  Today you received the following chemotherapy agents Gemzar and Carboplatin   To help prevent nausea and vomiting after your treatment, we encourage you to take your nausea medication  As prescribed by your doctor. If you develop nausea and vomiting that is not controlled by your nausea medication, call the clinic.   BELOW ARE SYMPTOMS THAT SHOULD BE REPORTED IMMEDIATELY:  *FEVER GREATER THAN 100.5 F  *CHILLS WITH OR WITHOUT FEVER  NAUSEA AND VOMITING THAT IS NOT CONTROLLED WITH YOUR NAUSEA MEDICATION  *UNUSUAL SHORTNESS OF BREATH  *UNUSUAL BRUISING OR BLEEDING  TENDERNESS IN MOUTH AND THROAT WITH OR WITHOUT PRESENCE OF ULCERS  *URINARY PROBLEMS  *BOWEL PROBLEMS Carboplatin injection What is this medicine? CARBOPLATIN (KAR boe pla tin) is a chemotherapy drug. It targets fast dividing cells, like cancer cells, and causes these cells to die. This medicine is used to treat ovarian cancer and many other cancers. This medicine may be used for other purposes; ask your health care provider or pharmacist if you have questions. COMMON BRAND NAME(S): Paraplatin What should I tell my health care provider before I take this medicine? They need to know if you have any of these conditions: -blood disorders -hearing problems -kidney disease -recent or ongoing radiation therapy -an unusual or allergic reaction to carboplatin, cisplatin, other chemotherapy, other medicines, foods, dyes, or preservatives -pregnant or trying to get pregnant -breast-feeding How should I use this medicine? This drug is usually given as an infusion into a vein. It is administered in a hospital or clinic by a specially trained health care professional. Talk to your pediatrician regarding the use of this medicine in children. Special care may be needed. Overdosage: If you think you have taken too much of this medicine contact a  poison control center or emergency room at once. NOTE: This medicine is only for you. Do not share this medicine with others. What if I miss a dose? It is important not to miss a dose. Call your doctor or health care professional if you are unable to keep an appointment. What may interact with this medicine? -medicines for seizures -medicines to increase blood counts like filgrastim, pegfilgrastim, sargramostim -some antibiotics like amikacin, gentamicin, neomycin, streptomycin, tobramycin -vaccines Talk to your doctor or health care professional before taking any of these medicines: -acetaminophen -aspirin -ibuprofen -ketoprofen -naproxen This list may not describe all possible interactions. Give your health care provider a list of all the medicines, herbs, non-prescription drugs, or dietary supplements you use. Also tell them if you smoke, drink alcohol, or use illegal drugs. Some items may interact with your medicine. What should I watch for while using this medicine? Your condition will be monitored carefully while you are receiving this medicine. You will need important blood work done while you are taking this medicine. This drug may make you feel generally unwell. This is not uncommon, as chemotherapy can affect healthy cells as well as cancer cells. Report any side effects. Continue your course of treatment even though you feel ill unless your doctor tells you to stop. In some cases, you may be given additional medicines to help with side effects. Follow all directions for their use. Call your doctor or health care professional for advice if you get a fever, chills or sore throat, or other symptoms of a cold or flu. Do not treat yourself. This drug decreases your body's ability to fight infections. Try to avoid being around people who are sick.  This medicine may increase your risk to bruise or bleed. Call your doctor or health care professional if you notice any unusual bleeding. Be  careful brushing and flossing your teeth or using a toothpick because you may get an infection or bleed more easily. If you have any dental work done, tell your dentist you are receiving this medicine. Avoid taking products that contain aspirin, acetaminophen, ibuprofen, naproxen, or ketoprofen unless instructed by your doctor. These medicines may hide a fever. Do not become pregnant while taking this medicine. Women should inform their doctor if they wish to become pregnant or think they might be pregnant. There is a potential for serious side effects to an unborn child. Talk to your health care professional or pharmacist for more information. Do not breast-feed an infant while taking this medicine. What side effects may I notice from receiving this medicine? Side effects that you should report to your doctor or health care professional as soon as possible: -allergic reactions like skin rash, itching or hives, swelling of the face, lips, or tongue -signs of infection - fever or chills, cough, sore throat, pain or difficulty passing urine -signs of decreased platelets or bleeding - bruising, pinpoint red spots on the skin, black, tarry stools, nosebleeds -signs of decreased red blood cells - unusually weak or tired, fainting spells, lightheadedness -breathing problems -changes in hearing -changes in vision -chest pain -high blood pressure -low blood counts - This drug may decrease the number of white blood cells, red blood cells and platelets. You may be at increased risk for infections and bleeding. -nausea and vomiting -pain, swelling, redness or irritation at the injection site -pain, tingling, numbness in the hands or feet -problems with balance, talking, walking -trouble passing urine or change in the amount of urine Side effects that usually do not require medical attention (report to your doctor or health care professional if they continue or are bothersome): -hair loss -loss of  appetite -metallic taste in the mouth or changes in taste This list may not describe all possible side effects. Call your doctor for medical advice about side effects. You may report side effects to FDA at 1-800-FDA-1088. Where should I keep my medicine? This drug is given in a hospital or clinic and will not be stored at home. NOTE: This sheet is a summary. It may not cover all possible information. If you have questions about this medicine, talk to your doctor, pharmacist, or health care provider.  2015, Elsevier/Gold Standard. (2007-07-13 14:38:05)   UNUSUAL RASH Items with * indicate a potential emergency and should be followed up as soon as possible.  Feel free to call the clinic you have any questions or concerns. The clinic phone number is [phone].  Please show the [place] at check-in to the Emergency Department and triage nurse.  Gemcitabine injection What is this medicine? GEMCITABINE (jem SIT a been) is a chemotherapy drug. This medicine is used to treat many types of cancer like breast cancer, lung cancer, pancreatic cancer, and ovarian cancer. This medicine may be used for other purposes; ask your health care provider or pharmacist if you have questions. COMMON BRAND NAME(S): Gemzar What should I tell my health care provider before I take this medicine? They need to know if you have any of these conditions: -blood disorders -infection -kidney disease -liver disease -recent or ongoing radiation therapy -an unusual or allergic reaction to gemcitabine, other chemotherapy, other medicines, foods, dyes, or preservatives -pregnant or trying to get pregnant -breast-feeding  How should I use this medicine? This drug is given as an infusion into a vein. It is administered in a hospital or clinic by a specially trained health care professional. Talk to your pediatrician regarding the use of this medicine in children. Special care may be needed. Overdosage: If you  think you have taken too much of this medicine contact a poison control center or emergency room at once. NOTE: This medicine is only for you. Do not share this medicine with others. What if I miss a dose? It is important not to miss your dose. Call your doctor or health care professional if you are unable to keep an appointment. What may interact with this medicine? -medicines to increase blood counts like filgrastim, pegfilgrastim, sargramostim -some other chemotherapy drugs like cisplatin -vaccines Talk to your doctor or health care professional before taking any of these medicines: -acetaminophen -aspirin -ibuprofen -ketoprofen -naproxen This list may not describe all possible interactions. Give your health care provider a list of all the medicines, herbs, non-prescription drugs, or dietary supplements you use. Also tell them if you smoke, drink alcohol, or use illegal drugs. Some items may interact with your medicine. What should I watch for while using this medicine? Visit your doctor for checks on your progress. This drug may make you feel generally unwell. This is not uncommon, as chemotherapy can affect healthy cells as well as cancer cells. Report any side effects. Continue your course of treatment even though you feel ill unless your doctor tells you to stop. In some cases, you may be given additional medicines to help with side effects. Follow all directions for their use. Call your doctor or health care professional for advice if you get a fever, chills or sore throat, or other symptoms of a cold or flu. Do not treat yourself. This drug decreases your body's ability to fight infections. Try to avoid being around people who are sick. This medicine may increase your risk to bruise or bleed. Call your doctor or health care professional if you notice any unusual bleeding. Be careful brushing and flossing your teeth or using a toothpick because you may get an infection or bleed more  easily. If you have any dental work done, tell your dentist you are receiving this medicine. Avoid taking products that contain aspirin, acetaminophen, ibuprofen, naproxen, or ketoprofen unless instructed by your doctor. These medicines may hide a fever. Women should inform their doctor if they wish to become pregnant or think they might be pregnant. There is a potential for serious side effects to an unborn child. Talk to your health care professional or pharmacist for more information. Do not breast-feed an infant while taking this medicine. What side effects may I notice from receiving this medicine? Side effects that you should report to your doctor or health care professional as soon as possible: -allergic reactions like skin rash, itching or hives, swelling of the face, lips, or tongue -low blood counts - this medicine may decrease the number of white blood cells, red blood cells and platelets. You may be at increased risk for infections and bleeding. -signs of infection - fever or chills, cough, sore throat, pain or difficulty passing urine -signs of decreased platelets or bleeding - bruising, pinpoint red spots on the skin, black, tarry stools, blood in the urine -signs of decreased red blood cells - unusually weak or tired, fainting spells, lightheadedness -breathing problems -chest pain -mouth sores -nausea and vomiting -pain, swelling, redness at site where injected -pain, tingling,  numbness in the hands or feet -stomach pain -swelling of ankles, feet, hands -unusual bleeding Side effects that usually do not require medical attention (report to your doctor or health care professional if they continue or are bothersome): -constipation -diarrhea -hair loss -loss of appetite -stomach upset This list may not describe all possible side effects. Call your doctor for medical advice about side effects. You may report side effects to FDA at 1-800-FDA-1088. Where should I keep my  medicine? This drug is given in a hospital or clinic and will not be stored at home. NOTE: This sheet is a summary. It may not cover all possible information. If you have questions about this medicine, talk to your doctor, pharmacist, or health care provider.  2015, Elsevier/Gold Standard. (2007-08-17 18:45:54)

## 2014-12-12 NOTE — Patient Instructions (Signed)
Your restaging CT scan revealed evidence for disease progression. Complete your radiation therapy to your T11 metastatic bone lesion Return in one week to start chemotherapy and follow up in 2 weeks for a symptom management visit.

## 2014-12-14 ENCOUNTER — Telehealth: Payer: Self-pay | Admitting: Medical Oncology

## 2014-12-14 NOTE — Telephone Encounter (Signed)
Doing well after chemo on Tuesday . He denies any problems .

## 2014-12-19 ENCOUNTER — Other Ambulatory Visit (HOSPITAL_BASED_OUTPATIENT_CLINIC_OR_DEPARTMENT_OTHER): Payer: Medicaid Other

## 2014-12-19 ENCOUNTER — Ambulatory Visit (HOSPITAL_BASED_OUTPATIENT_CLINIC_OR_DEPARTMENT_OTHER): Payer: Medicaid Other

## 2014-12-19 ENCOUNTER — Encounter: Payer: Self-pay | Admitting: Internal Medicine

## 2014-12-19 ENCOUNTER — Ambulatory Visit (HOSPITAL_BASED_OUTPATIENT_CLINIC_OR_DEPARTMENT_OTHER): Payer: Medicaid Other | Admitting: Internal Medicine

## 2014-12-19 ENCOUNTER — Telehealth: Payer: Self-pay | Admitting: Internal Medicine

## 2014-12-19 VITALS — BP 100/67 | HR 120 | Temp 98.5°F | Resp 19 | Ht 65.0 in | Wt 104.0 lb

## 2014-12-19 DIAGNOSIS — Z5111 Encounter for antineoplastic chemotherapy: Secondary | ICD-10-CM

## 2014-12-19 DIAGNOSIS — C3490 Malignant neoplasm of unspecified part of unspecified bronchus or lung: Secondary | ICD-10-CM

## 2014-12-19 DIAGNOSIS — R630 Anorexia: Secondary | ICD-10-CM | POA: Diagnosis not present

## 2014-12-19 DIAGNOSIS — C3411 Malignant neoplasm of upper lobe, right bronchus or lung: Secondary | ICD-10-CM

## 2014-12-19 DIAGNOSIS — C7951 Secondary malignant neoplasm of bone: Secondary | ICD-10-CM

## 2014-12-19 DIAGNOSIS — C3491 Malignant neoplasm of unspecified part of right bronchus or lung: Secondary | ICD-10-CM

## 2014-12-19 LAB — CBC WITH DIFFERENTIAL/PLATELET
BASO%: 0.7 % (ref 0.0–2.0)
BASOS ABS: 0 10*3/uL (ref 0.0–0.1)
EOS ABS: 0.1 10*3/uL (ref 0.0–0.5)
EOS%: 1.7 % (ref 0.0–7.0)
HCT: 30.6 % — ABNORMAL LOW (ref 38.4–49.9)
HEMOGLOBIN: 10.3 g/dL — AB (ref 13.0–17.1)
LYMPH%: 11.3 % — AB (ref 14.0–49.0)
MCH: 30.8 pg (ref 27.2–33.4)
MCHC: 33.7 g/dL (ref 32.0–36.0)
MCV: 91.6 fL (ref 79.3–98.0)
MONO#: 0.1 10*3/uL (ref 0.1–0.9)
MONO%: 4.7 % (ref 0.0–14.0)
NEUT%: 81.6 % — ABNORMAL HIGH (ref 39.0–75.0)
NEUTROS ABS: 2.5 10*3/uL (ref 1.5–6.5)
PLATELETS: 183 10*3/uL (ref 140–400)
RBC: 3.34 10*6/uL — ABNORMAL LOW (ref 4.20–5.82)
RDW: 13.5 % (ref 11.0–14.6)
WBC: 3 10*3/uL — AB (ref 4.0–10.3)
lymph#: 0.3 10*3/uL — ABNORMAL LOW (ref 0.9–3.3)

## 2014-12-19 LAB — COMPREHENSIVE METABOLIC PANEL (CC13)
ALBUMIN: 3.2 g/dL — AB (ref 3.5–5.0)
ALK PHOS: 107 U/L (ref 40–150)
ALT: 20 U/L (ref 0–55)
ANION GAP: 13 meq/L — AB (ref 3–11)
AST: 15 U/L (ref 5–34)
BILIRUBIN TOTAL: 0.56 mg/dL (ref 0.20–1.20)
BUN: 20.7 mg/dL (ref 7.0–26.0)
CO2: 20 mEq/L — ABNORMAL LOW (ref 22–29)
Calcium: 9.8 mg/dL (ref 8.4–10.4)
Chloride: 105 mEq/L (ref 98–109)
Creatinine: 1 mg/dL (ref 0.7–1.3)
EGFR: 80 mL/min/{1.73_m2} — AB (ref >90)
Glucose: 213 mg/dl — ABNORMAL HIGH (ref 70–140)
POTASSIUM: 4.2 meq/L (ref 3.5–5.1)
SODIUM: 138 meq/L (ref 136–145)
TOTAL PROTEIN: 7.3 g/dL (ref 6.4–8.3)

## 2014-12-19 MED ORDER — PROCHLORPERAZINE MALEATE 10 MG PO TABS
ORAL_TABLET | ORAL | Status: AC
  Filled 2014-12-19: qty 1

## 2014-12-19 MED ORDER — SODIUM CHLORIDE 0.9 % IV SOLN
Freq: Once | INTRAVENOUS | Status: AC
  Administered 2014-12-19: 15:00:00 via INTRAVENOUS

## 2014-12-19 MED ORDER — METHYLPREDNISOLONE 4 MG PO TBPK: ORAL_TABLET | ORAL | Status: DC

## 2014-12-19 MED ORDER — PROCHLORPERAZINE MALEATE 10 MG PO TABS
10.0000 mg | ORAL_TABLET | Freq: Once | ORAL | Status: AC
  Administered 2014-12-19: 10 mg via ORAL

## 2014-12-19 MED ORDER — SODIUM CHLORIDE 0.9 % IV SOLN
1000.0000 mg/m2 | Freq: Once | INTRAVENOUS | Status: AC
  Administered 2014-12-19: 1520 mg via INTRAVENOUS
  Filled 2014-12-19: qty 39.98

## 2014-12-19 MED ORDER — OXYCODONE-ACETAMINOPHEN 5-325 MG PO TABS: 1.0000 | ORAL_TABLET | Freq: Four times a day (QID) | ORAL | Status: DC | PRN

## 2014-12-19 NOTE — Patient Instructions (Signed)
Smoking Cessation Quitting smoking is important to your health and has many advantages. However, it is not always easy to quit since nicotine is a very addictive drug. Oftentimes, people try 3 times or more before being able to quit. This document explains the best ways for you to prepare to quit smoking. Quitting takes hard work and a lot of effort, but you can do it. ADVANTAGES OF QUITTING SMOKING  You will live longer, feel better, and live better.  Your body will feel the impact of quitting smoking almost immediately.  Within 20 minutes, blood pressure decreases. Your pulse returns to its normal level.  After 8 hours, carbon monoxide levels in the blood return to normal. Your oxygen level increases.  After 24 hours, the chance of having a heart attack starts to decrease. Your breath, hair, and body stop smelling like smoke.  After 48 hours, damaged nerve endings begin to recover. Your sense of taste and smell improve.  After 72 hours, the body is virtually free of nicotine. Your bronchial tubes relax and breathing becomes easier.  After 2 to 12 weeks, lungs can hold more air. Exercise becomes easier and circulation improves.  The risk of having a heart attack, stroke, cancer, or lung disease is greatly reduced.  After 1 year, the risk of coronary heart disease is cut in half.  After 5 years, the risk of stroke falls to the same as a nonsmoker.  After 10 years, the risk of lung cancer is cut in half and the risk of other cancers decreases significantly.  After 15 years, the risk of coronary heart disease drops, usually to the level of a nonsmoker.  If you are pregnant, quitting smoking will improve your chances of having a healthy baby.  The people you live with, especially any children, will be healthier.  You will have extra money to spend on things other than cigarettes. QUESTIONS TO THINK ABOUT BEFORE ATTEMPTING TO QUIT You may want to talk about your answers with your  health care provider.  Why do you want to quit?  If you tried to quit in the past, what helped and what did not?  What will be the most difficult situations for you after you quit? How will you plan to handle them?  Who can help you through the tough times? Your family? Friends? A health care provider?  What pleasures do you get from smoking? What ways can you still get pleasure if you quit? Here are some questions to ask your health care provider:  How can you help me to be successful at quitting?  What medicine do you think would be best for me and how should I take it?  What should I do if I need more help?  What is smoking withdrawal like? How can I get information on withdrawal? GET READY  Set a quit date.  Change your environment by getting rid of all cigarettes, ashtrays, matches, and lighters in your home, car, or work. Do not let people smoke in your home.  Review your past attempts to quit. Think about what worked and what did not. GET SUPPORT AND ENCOURAGEMENT You have a better chance of being successful if you have help. You can get support in many ways.  Tell your family, friends, and coworkers that you are going to quit and need their support. Ask them not to smoke around you.  Get individual, group, or telephone counseling and support. Programs are available at local hospitals and health centers. Call   your local health department for information about programs in your area.  Spiritual beliefs and practices may help some smokers quit.  Download a "quit meter" on your computer to keep track of quit statistics, such as how long you have gone without smoking, cigarettes not smoked, and money saved.  Get a self-help book about quitting smoking and staying off tobacco. LEARN NEW SKILLS AND BEHAVIORS  Distract yourself from urges to smoke. Talk to someone, go for a walk, or occupy your time with a task.  Change your normal routine. Take a different route to work.  Drink tea instead of coffee. Eat breakfast in a different place.  Reduce your stress. Take a hot bath, exercise, or read a book.  Plan something enjoyable to do every day. Reward yourself for not smoking.  Explore interactive web-based programs that specialize in helping you quit. GET MEDICINE AND USE IT CORRECTLY Medicines can help you stop smoking and decrease the urge to smoke. Combining medicine with the above behavioral methods and support can greatly increase your chances of successfully quitting smoking.  Nicotine replacement therapy helps deliver nicotine to your body without the negative effects and risks of smoking. Nicotine replacement therapy includes nicotine gum, lozenges, inhalers, nasal sprays, and skin patches. Some may be available over-the-counter and others require a prescription.  Antidepressant medicine helps people abstain from smoking, but how this works is unknown. This medicine is available by prescription.  Nicotinic receptor partial agonist medicine simulates the effect of nicotine in your brain. This medicine is available by prescription. Ask your health care provider for advice about which medicines to use and how to use them based on your health history. Your health care provider will tell you what side effects to look out for if you choose to be on a medicine or therapy. Carefully read the information on the package. Do not use any other product containing nicotine while using a nicotine replacement product.  RELAPSE OR DIFFICULT SITUATIONS Most relapses occur within the first 3 months after quitting. Do not be discouraged if you start smoking again. Remember, most people try several times before finally quitting. You may have symptoms of withdrawal because your body is used to nicotine. You may crave cigarettes, be irritable, feel very hungry, cough often, get headaches, or have difficulty concentrating. The withdrawal symptoms are only temporary. They are strongest  when you first quit, but they will go away within 10-14 days. To reduce the chances of relapse, try to:  Avoid drinking alcohol. Drinking lowers your chances of successfully quitting.  Reduce the amount of caffeine you consume. Once you quit smoking, the amount of caffeine in your body increases and can give you symptoms, such as a rapid heartbeat, sweating, and anxiety.  Avoid smokers because they can make you want to smoke.  Do not let weight gain distract you. Many smokers will gain weight when they quit, usually less than 10 pounds. Eat a healthy diet and stay active. You can always lose the weight gained after you quit.  Find ways to improve your mood other than smoking. FOR MORE INFORMATION  www.smokefree.gov  Document Released: 04/01/2001 Document Revised: 08/22/2013 Document Reviewed: 07/17/2011 Exit[employer] Patient Information 2015 [employer]. This information is not intended to replace advice given to you by your health care provider. Make sure you discuss any questions you have with your health care provider.  

## 2014-12-19 NOTE — Progress Notes (Signed)
[facility] [phone]   Fax:(336) [place], [place] [address]  DIAGNOSIS: Metastatic non-small cell lung cancer initially diagnosed as Unresectable a stage IIA (T1a, N1, M0) non-small cell lung cancer, invasive poorly differentiated carcinoma diagnosed in March 2016  PRIOR THERAPY:  1) A course of concurrent chemoradiation with weekly carboplatin for AUC of 2 and paclitaxel 45 MG/M2. 2) concurrent chemoradiation with weekly carboplatin for AUC of 2 and paclitaxel 45 mg/m2 resumed on 10/09/14. Completed his last dose on 10/24/14.  CURRENT THERAPY: Systemic chemotherapy with carboplatin for AUC of 5 on day 1 and gemcitabine 1000 MG/M2 on days 1 and 8 every 3 weeks. First dose on 12/12/2014.  INTERVAL HISTORY: Adam Warner 59 y.o. male returns to the clinic today for follow-up visit accompanied by his niece. The patient continues to complain of increasing fatigue and weakness as well as lack of appetite and weight loss. The patient is currently undergoing systemic chemotherapy with carboplatin and gemcitabine status post day 1 of the first cycle. He denied having any significant chest pain, but has shortness breath with exertion with no cough or hemoptysis. He denied having any significant nausea or vomiting, no fever or chills. He is here today to start day 8 of cycle #1. He continues to have low back pain and currently on Percocet as well as palliative radiotherapy.  MEDICAL HISTORY: Past Medical History  Diagnosis Date  . COPD (chronic obstructive pulmonary disease)   . Stroke   . Hypertension   . Heart attack 1991  . Pulmonary nodule   . Coronary artery disease     sees Dr Spero Curb at [facility] every 2-3 years  . Shortness of breath dyspnea   . Full dentures   . Diabetes mellitus     Type Niddm x 2 years    ALLERGIES:  is allergic to zyban.  MEDICATIONS:  Current Outpatient Prescriptions   Medication Sig Dispense Refill  . ACCU-CHEK AVIVA PLUS test strip USE TO CHECK ONCE OR TWICE DAILY 100 each 0  . ACCU-CHEK FASTCLIX LANCETS MISC Check blood sugar bid and prn 102 each 0  . aspirin EC 325 MG tablet Take 325 mg by mouth every morning. Hold for surgery    . atorvastatin (LIPITOR) 40 MG tablet TAKE ONE TABLET BY MOUTH ONE TIME DAILY 30 tablet 6  . Blood Glucose Monitoring Suppl (ONE TOUCH ULTRA 2) W/DEVICE KIT Use monitor to test blood sugar qd Dx 250.00 1 each 0  . Cholecalciferol (VITAMIN D) 2000 UNITS CAPS Take 1 capsule by mouth daily.    . feeding supplement, GLUCERNA SHAKE, (GLUCERNA SHAKE) LIQD Take 237 mLs by mouth 2 (two) times daily between meals. 60 Can 5  . metFORMIN (GLUCOPHAGE) 500 MG tablet Take 1 tablet (500 mg total) by mouth 2 (two) times daily with a meal. 60 tablet 2  . oxyCODONE-acetaminophen (PERCOCET/ROXICET) 5-325 MG per tablet Take 1 tablet by mouth every 6 (six) hours as needed for moderate pain or severe pain. 30 tablet 0  . prochlorperazine (COMPAZINE) 10 MG tablet Take 1 tablet (10 mg total) by mouth every 6 (six) hours as needed for nausea or vomiting. 30 tablet 0  . tiotropium (SPIRIVA) 18 MCG inhalation capsule Place 1 capsule (18 mcg total) into inhaler and inhale daily. 30 capsule 6  . valsartan (DIOVAN) 320 MG tablet Take 1 tablet (320 mg total) by mouth daily. 30 tablet 6  No current facility-administered medications for this visit.    SURGICAL HISTORY:  Past Surgical History  Procedure Laterality Date  . Lung surgery      Dr Arlyce Dice  . Angioplasty    . Colonoscopy  10/22/2011    Procedure: COLONOSCOPY;  Surgeon: Rogene Houston, MD;  Location: [facility];  Service: Endoscopy;  Laterality: N/A;  1200  . Carotid endarterectomy Right   . Video bronchoscopy with endobronchial ultrasound N/A 05/29/2014    Procedure: VIDEO BRONCHOSCOPY WITH ENDOBRONCHIAL ULTRASOUND;  Surgeon: Melrose Nakayama, MD;  Location: [facility];  Service: Thoracic;   Laterality: N/A;    REVIEW OF SYSTEMS:  Constitutional: negative Eyes: negative Ears, nose, mouth, throat, and face: negative Respiratory: positive for dyspnea on exertion Cardiovascular: negative Gastrointestinal: negative Genitourinary:negative Integument/breast: negative Hematologic/lymphatic: negative Musculoskeletal:negative Neurological: negative Behavioral/Psych: negative Endocrine: negative Allergic/Immunologic: negative   PHYSICAL EXAMINATION: General appearance: alert, cooperative and no distress Head: Normocephalic, without obvious abnormality, atraumatic Neck: no adenopathy, no JVD, supple, symmetrical, trachea midline and thyroid not enlarged, symmetric, no tenderness/mass/nodules Lymph nodes: Cervical, supraclavicular, and axillary nodes normal. Resp: clear to auscultation bilaterally Back: symmetric, no curvature. ROM normal. No CVA tenderness. Cardio: regular rate and rhythm, S1, S2 normal, no murmur, click, rub or gallop GI: soft, non-tender; bowel sounds normal; no masses,  no organomegaly Extremities: extremities normal, atraumatic, no cyanosis or edema Neurologic: Alert and oriented X 3, normal strength and tone. Normal symmetric reflexes. Normal coordination and gait  ECOG PERFORMANCE STATUS: 1 - Symptomatic but completely ambulatory  Blood pressure 100/67, pulse 120, temperature 98.5 F (36.9 C), temperature source Oral, resp. rate 19, height $RemoveBe'5\' 5"'[id number]$  (1.651 m), weight 104 lb (47.174 kg), SpO2 100 %.  LABORATORY DATA: Lab Results  Component Value Date   WBC 3.0* 12/19/2014   HGB 10.3* 12/19/2014   HCT 30.6* 12/19/2014   MCV 91.6 12/19/2014   PLT 183 12/19/2014      Chemistry      Component Value Date/Time   NA 138 12/19/2014 1304   NA 136 05/23/2014 1015   NA 139 04/12/2014 1029   K 4.2 12/19/2014 1304   K 4.1 05/23/2014 1015   CL 106 05/23/2014 1015   CO2 20* 12/19/2014 1304   CO2 25 05/23/2014 1015   BUN 20.7 12/19/2014 1304   BUN 10  05/23/2014 1015   BUN 12 04/12/2014 1029   CREATININE 1.0 12/19/2014 1304   CREATININE 1.07 05/23/2014 1015   CREATININE 1.05 11/04/2012 1242      Component Value Date/Time   CALCIUM 9.8 12/19/2014 1304   CALCIUM 9.3 05/23/2014 1015   ALKPHOS 107 12/19/2014 1304   ALKPHOS 90 05/23/2014 1015   AST 15 12/19/2014 1304   AST 19 05/23/2014 1015   ALT 20 12/19/2014 1304   ALT 17 05/23/2014 1015   BILITOT 0.56 12/19/2014 1304   BILITOT 0.6 05/23/2014 1015       RADIOGRAPHIC STUDIES: Ct Chest W Contrast  11/27/2014   CLINICAL DATA:  Subsequent encounter for non-small-cell lung cancer. Status post left upper lobectomy. Weight loss with shortness of breath on exertion.  EXAM: CT CHEST WITH CONTRAST  TECHNIQUE: Multidetector CT imaging of the chest was performed during intravenous contrast administration.  CONTRAST:  25mL OMNIPAQUE IOHEXOL 300 MG/ML  SOLN  COMPARISON:  09/11/2014  FINDINGS: Mediastinum / Lymph Nodes: There is no axillary lymphadenopathy. Right hilar lesion measured previously at 2.3 x 2.2 cm now measures 2.5 x 2.1 cm. Inferior right hilar lymph node measured previously  at 7 mm short axis is stable at 7 mm. Small paratracheal and subcarinal lymph nodes are stable and do not meet CT criteria for lymphadenopathy. Heart size is normal. Trace anterior pericardial fluid is slightly progressed in the interval. Coronary artery calcification is evident.  Lungs / Pleura: Subpleural reticulation in the lungs and new is slightly more advanced on the right since the prior study. Surgical suture line is seen in the left lung, as before. 10 mm anterior right upper lobe nodule measured previously has resolved in the interval. 8 mm peripheral right lung nodule seen on image 28 of series 5 today is stable. Tiny right pleural effusion is new in the interval.  MSK / Soft Tissues: Lytic lesion in the right aspect of the T11 vertebral body is new in the interval. This lesion measures approximately 2.6 cm.   Upper Abdomen: 2.5 cm lesion in the lateral segment left liver is new in the interval. Other scattered very tiny low-density liver lesions also appear new. 14 mm gastrohepatic ligament lymph node seen on image 49 series 2 is new since the prior study. 14 mm short axis hepatoduodenal ligament lymph node on image 54 series 2 is new since the prior study.  IMPRESSION: 1. Interval worsening of interstitial opacity in the right lung, mainly anteriorly. This is associated with a new tiny right pleural effusion. These changes may be related to infection, but 2 per progression could also have this appearance. 2. Interval development of a lytic bone lesion in the T11 vertebral body, compatible with bony metastatic involvement. 3. Interval development of a 2.5 cm left liver lesion, highly suspicious for metastatic disease. Other scattered smaller liver lesions also raise concern for metastases. 4. New gastrohepatic and hepatoduodenal ligament lymphadenopathy concerning for interval development of metastatic involvement of upper abdominal lymph nodes. These results will be called to the ordering clinician or representative by the Radiologist Assistant, and communication documented in the PACS or zVision Dashboard.   Electronically Signed   By: Misty Stanley M.D.   On: 11/27/2014 11:39    ASSESSMENT AND PLAN: This is a very pleasant 59 years old white male with history of stage IIA non-small cell lung cancer status post a course of concurrent chemoradiation with weekly carboplatin and paclitaxel. The recent CT scan of the chest showed interval decrease in the size of the left hilar mass and adenopathy. He is currently undergoing systemic chemotherapy with carboplatin and gemcitabine status post day 1 of the first cycle. He tolerated the treatment well except for fatigue. I recommended for him to proceed with day 8 of the first cycle as a scheduled. He would come back for follow-up visit in 2 weeks for reevaluation before  starting cycle #2 of his treatment. He was given a refill of Percocet for the back pain. For the lack of appetite, I started the patient on Medrol Dosepak. He was advised to call immediately if he has any concerning symptoms in the interval. The patient was encouraged to quit smoking and we offered him to smoke cessation program. The patient voices understanding of current disease status and treatment options and is in agreement with the current care plan.  All questions were answered. The patient knows to call the clinic with any problems, questions or concerns. We can certainly see the patient much sooner if necessary.  Disclaimer: This note was dictated with voice recognition software. Similar sounding words can inadvertently be transcribed and may not be corrected upon review.

## 2014-12-19 NOTE — Patient Instructions (Signed)
[facility] Discharge Instructions for Patients Receiving Chemotherapy Today you received the following chemotherapy agents  Gemzar  To help prevent nausea and vomiting after your treatment, we encourage you to take your nausea medication As directed If you develop nausea and vomiting that is not controlled by your nausea medication, call the clinic.   BELOW ARE SYMPTOMS THAT SHOULD BE REPORTED IMMEDIATELY:  *FEVER GREATER THAN 100.5 F  *CHILLS WITH OR WITHOUT FEVER  NAUSEA AND VOMITING THAT IS NOT CONTROLLED WITH YOUR NAUSEA MEDICATION  *UNUSUAL SHORTNESS OF BREATH  *UNUSUAL BRUISING OR BLEEDING  TENDERNESS IN MOUTH AND THROAT WITH OR WITHOUT PRESENCE OF ULCERS  *URINARY PROBLEMS  *BOWEL PROBLEMS  UNUSUAL RASH Items with * indicate a potential emergency and should be followed up as soon as possible.  Feel free to call the clinic you have any questions or concerns. The clinic phone number is [phone].  Please show the [facility] at check-in to the Emergency Department and triage nurse.

## 2014-12-19 NOTE — Telephone Encounter (Signed)
per pof tos ch pt appt-sent MW email to sch trmt per pof

## 2014-12-20 ENCOUNTER — Telehealth: Payer: Self-pay | Admitting: Physician Assistant

## 2014-12-20 ENCOUNTER — Telehealth: Payer: Self-pay | Admitting: *Deleted

## 2014-12-20 NOTE — Telephone Encounter (Signed)
per pof to sch pt appt-cld pt & left message of time & date of 9/13 appt time & date

## 2014-12-20 NOTE — Telephone Encounter (Signed)
Per staff message and POF I have scheduled appts. Advised scheduler of appts. JMW  

## 2015-01-01 ENCOUNTER — Encounter: Payer: Self-pay | Admitting: Family Medicine

## 2015-01-01 ENCOUNTER — Ambulatory Visit (INDEPENDENT_AMBULATORY_CARE_PROVIDER_SITE_OTHER): Payer: Medicaid Other | Admitting: Family Medicine

## 2015-01-01 VITALS — BP 132/85 | HR 91 | Temp 97.0°F | Ht 65.0 in | Wt 102.0 lb

## 2015-01-01 DIAGNOSIS — C3491 Malignant neoplasm of unspecified part of right bronchus or lung: Secondary | ICD-10-CM

## 2015-01-01 DIAGNOSIS — E119 Type 2 diabetes mellitus without complications: Secondary | ICD-10-CM

## 2015-01-01 DIAGNOSIS — C7951 Secondary malignant neoplasm of bone: Secondary | ICD-10-CM | POA: Diagnosis not present

## 2015-01-01 DIAGNOSIS — N400 Enlarged prostate without lower urinary tract symptoms: Secondary | ICD-10-CM

## 2015-01-01 DIAGNOSIS — I10 Essential (primary) hypertension: Secondary | ICD-10-CM

## 2015-01-01 DIAGNOSIS — J449 Chronic obstructive pulmonary disease, unspecified: Secondary | ICD-10-CM

## 2015-01-01 DIAGNOSIS — I700 Atherosclerosis of aorta: Secondary | ICD-10-CM | POA: Diagnosis not present

## 2015-01-01 DIAGNOSIS — E785 Hyperlipidemia, unspecified: Secondary | ICD-10-CM | POA: Diagnosis not present

## 2015-01-01 DIAGNOSIS — E559 Vitamin D deficiency, unspecified: Secondary | ICD-10-CM | POA: Diagnosis not present

## 2015-01-01 NOTE — Progress Notes (Signed)
Subjective:    Patient ID: Adam Warner, male    DOB: 1956/03/15, 59 y.o.   MRN: [id number]  HPI Pt here for follow up and management of chronic medical problems which includes lung cancer, hypertension, hyperlipidemia, and diabetes. He is taking medications regularly. 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 He also has COPD and emphysema from many years of smoking. He is being followed regularly by the oncologist and is currently receiving chemotherapy. He has had a difficult couple of years with this diagnosis and his wife having had a stroke and being at home now for him to look after. The patient is not a complainer. He has had some shortness of breath but no chest pain. He is having some back pain. He's passing his water without problems and is not been any problems with his GI system. He indicates that the oncologist has told him that the cancer has spread everywhere.       Patient Active Problem List   Diagnosis Date Noted  . Metastasis to bone 12/06/2014  . Encounter for antineoplastic chemotherapy 10/10/2014  . Non-small cell carcinoma of lung, stage 2 06/22/2014  . FH: lung cancer 06/13/2014  . CAD, NATIVE VESSEL 03/08/2010  . CAROTID ARTERY STENOSIS, WITHOUT INFARCTION 03/08/2010  . LEG PAIN 03/08/2010  . SYNCOPE AND COLLAPSE 03/08/2010  . ABNORMAL CV (STRESS) TEST 03/08/2010  . Hyperlipidemia 02/12/2010  . SMOKER 02/12/2010  . C V A / STROKE 02/12/2010  . COPD GOLD II 02/12/2010  . Pulmonary nodule 02/12/2010  . ABNORMAL LUNG XRAY 02/12/2010  . Diabetes 02/11/2010  . Essential hypertension 02/11/2010  . CAD 02/11/2010  . TIA 02/11/2010   Outpatient Encounter Prescriptions as of 01/01/2015  Medication Sig  . ACCU-CHEK AVIVA PLUS test strip USE TO CHECK ONCE OR TWICE DAILY  . ACCU-CHEK FASTCLIX LANCETS MISC Check blood sugar bid and prn  . aspirin EC 325 MG tablet Take 325 mg by mouth every morning. Hold for surgery  . atorvastatin  (LIPITOR) 40 MG tablet TAKE ONE TABLET BY MOUTH ONE TIME DAILY  . Blood Glucose Monitoring Suppl (ONE TOUCH ULTRA 2) W/DEVICE KIT Use monitor to test blood sugar qd Dx 250.00  . Cholecalciferol (VITAMIN D) 2000 UNITS CAPS Take 1 capsule by mouth daily.  . feeding supplement, GLUCERNA SHAKE, (GLUCERNA SHAKE) LIQD Take 237 mLs by mouth 2 (two) times daily between meals.  . metFORMIN (GLUCOPHAGE) 500 MG tablet Take 1 tablet (500 mg total) by mouth 2 (two) times daily with a meal.  . oxyCODONE-acetaminophen (PERCOCET/ROXICET) 5-325 MG per tablet Take 1 tablet by mouth every 6 (six) hours as needed for moderate pain or severe pain.  Marland Kitchen prochlorperazine (COMPAZINE) 10 MG tablet Take 1 tablet (10 mg total) by mouth every 6 (six) hours as needed for nausea or vomiting.  . tiotropium (SPIRIVA) 18 MCG inhalation capsule Place 1 capsule (18 mcg total) into inhaler and inhale daily.  . valsartan (DIOVAN) 320 MG tablet Take 1 tablet (320 mg total) by mouth daily.  . [DISCONTINUED] methylPREDNISolone (MEDROL DOSEPAK) 4 MG TBPK tablet Use as instructed   No facility-administered encounter medications on file as of 01/01/2015.     Review of Systems  Constitutional: Negative.   HENT: Negative.   Eyes: Negative.   Respiratory: Negative.   Cardiovascular: Negative.   Gastrointestinal: Negative.   Endocrine: Negative.   Genitourinary: Negative.   Musculoskeletal: Negative.   Skin: Negative.   Allergic/Immunologic: Negative.   Neurological: Negative.  Hematological: Negative.   Psychiatric/Behavioral: Negative.        Objective:   Physical Exam  Constitutional: He is oriented to person, place, and time. No distress.  Thin and somewhat frail but alert  HENT:  Head: Normocephalic and atraumatic.  Right Ear: External ear normal.  Left Ear: External ear normal.  Nose: Nose normal.  Mouth/Throat: Oropharynx is clear and moist. No oropharyngeal exudate.  Eyes: Conjunctivae and EOM are normal. Pupils  are equal, round, and reactive to light. Right eye exhibits no discharge. Left eye exhibits no discharge. No scleral icterus.  Neck: Normal range of motion. Neck supple. No thyromegaly present.  Cardiovascular: Normal rate, regular rhythm and normal heart sounds.   No murmur heard. Heart is regular at 72/m  Pulmonary/Chest: Effort normal. No respiratory distress. He has no wheezes. He has no rales. He exhibits no tenderness.  Decreased breath sounds bilaterally  Abdominal: Soft. Bowel sounds are normal. He exhibits no mass. There is no tenderness. There is no rebound and no guarding.  Nontender without masses or organ enlargement or inguinal adenopathy  Musculoskeletal: Normal range of motion. He exhibits no edema.  Lymphadenopathy:    He has no cervical adenopathy.  Neurological: He is alert and oriented to person, place, and time.  Skin: Skin is warm and dry. No rash noted.  Psychiatric: He has a normal mood and affect. His behavior is normal. Judgment and thought content normal.  The mood is somewhat down but positive he recognizes that he has terminal cancer and the treatment options are not good at this point in time.  Nursing note and vitals reviewed.  BP 132/85 mmHg  Pulse 91  Temp(Src) 97 F (36.1 C) (Oral)  Ht $R'5\' 5"'Fy$  (1.651 m)  Wt 102 lb (46.267 kg)  BMI 16.97 kg/m2        Assessment & Plan:  1. Type 2 diabetes mellitus without complication -Cause of the cancer and his declining appetite we will make sure that he is still in need of taking the metformin that he is currently taking and this will be dependent upon the lab work that we received today. - POCT glycosylated hemoglobin (Hb A1C) - BMP8+EGFR - CBC with Differential/Platelet  2. Hyperlipidemia - CBC with Differential/Platelet - Lipid panel  3. Essential hypertension -For now continue with current treatment - BMP8+EGFR - Hepatic function panel - CBC with Differential/Platelet  4. COPD GOLD II -The patient  has indicated that the smoking cessation does not matter so he is probably not going to stop smoking. - CBC with Differential/Platelet  5. Vitamin D deficiency -Continue with current treatment pending results of lab work - CBC with Differential/Platelet - Vit D  25 hydroxy (rtn osteoporosis monitoring)  6. BPH (benign prostatic hyperplasia) -He is having no urinary tract symptoms. - CBC with Differential/Platelet  7. Abdominal aortic atherosclerosis -He will continue with his current medication and we'll make a decision about the smoking cessation. - CBC with Differential/Platelet - Lipid panel  8. Non-small cell cancer of right lung -Continue to follow-up with oncology - CBC with Differential/Platelet  9. Metastasis to bone -Continue to follow-up with oncology  Patient Instructions                       Medicare Annual Wellness Visit  [facility] and the medical providers at [facility] strive to bring you the best medical care.  In doing so we not only want to address your current medical  conditions and concerns but also to detect new conditions early and prevent illness, disease and health-related problems.    Medicare offers a yearly Wellness Visit which allows our clinical staff to assess your need for preventative services including immunizations, lifestyle education, counseling to decrease risk of preventable diseases and screening for fall risk and other medical concerns.    This visit is provided free of charge (no copay) for all Medicare recipients. The clinical pharmacists at [facility] have begun to conduct these Wellness Visits which will also include a thorough review of all your medications.    As you primary medical provider recommend that you make an appointment for your Annual Wellness Visit if you have not done so already this year.  You may set up this appointment before you leave today or you may call back ([phone])  and schedule an appointment.  Please make sure when you call that you mention that you are scheduling your Annual Wellness Visit with the clinical pharmacist so that the appointment may be made for the proper length of time.     Continue current medications. Continue good therapeutic lifestyle changes which include good diet and exercise. Fall precautions discussed with patient. If an FOBT was given today- please return it to our front desk. If you are over 23 years old - you may need Prevnar 63 or the adult Pneumonia vaccine.  **Flu shots will be available soon--- please call and schedule a FLU-CLINIC appointment**  After your visit with [place] today you will receive a survey in the mail or online from [employer] regarding your care with [place]. Please take a moment to fill this out. Your feedback is very important to [place] as you can help [place] better understand your patient needs as well as improve your experience and satisfaction. WE CARE ABOUT YOU!!!   **Please join [place] SEPT.22, 2016 from 5:00 to 7:00pm for our OPEN HOUSE! Come out and meet our NEW providers**  the patient should continue to follow-up with his oncologist  He should call us if he has any problems or has any needs that we can help with  He should continue with current treatment plans.   Arrie Senate MD

## 2015-01-01 NOTE — Patient Instructions (Addendum)
Medicare Annual Wellness Visit  [facility] and the medical providers at [facility] strive to bring you the best medical care.  In doing so we not only want to address your current medical conditions and concerns but also to detect new conditions early and prevent illness, disease and health-related problems.    Medicare offers a yearly Wellness Visit which allows our clinical staff to assess your need for preventative services including immunizations, lifestyle education, counseling to decrease risk of preventable diseases and screening for fall risk and other medical concerns.    This visit is provided free of charge (no copay) for all Medicare recipients. The clinical pharmacists at [facility] have begun to conduct these Wellness Visits which will also include a thorough review of all your medications.    As you primary medical provider recommend that you make an appointment for your Annual Wellness Visit if you have not done so already this year.  You may set up this appointment before you leave today or you may call back ([phone]) and schedule an appointment.  Please make sure when you call that you mention that you are scheduling your Annual Wellness Visit with the clinical pharmacist so that the appointment may be made for the proper length of time.     Continue current medications. Continue good therapeutic lifestyle changes which include good diet and exercise. Fall precautions discussed with patient. If an FOBT was given today- please return it to our front desk. If you are over 68 years old - you may need Prevnar 39 or the adult Pneumonia vaccine.  **Flu shots will be available soon--- please call and schedule a FLU-CLINIC appointment**  After your visit with [place] today you will receive a survey in the mail or online from [employer] regarding your care with [place]. Please take a moment to fill this out. Your feedback is  very important to [place] as you can help [place] better understand your patient needs as well as improve your experience and satisfaction. WE CARE ABOUT YOU!!!   **Please join [place] SEPT.22, 2016 from 5:00 to 7:00pm for our OPEN HOUSE! Come out and meet our NEW providers**  the patient should continue to follow-up with his oncologist  He should call us if he has any problems or has any needs that we can help with  He should continue with current treatment plans.

## 2015-01-02 ENCOUNTER — Other Ambulatory Visit: Payer: Medicaid Other

## 2015-01-02 ENCOUNTER — Ambulatory Visit (HOSPITAL_BASED_OUTPATIENT_CLINIC_OR_DEPARTMENT_OTHER): Payer: Medicaid Other | Admitting: Physician Assistant

## 2015-01-02 ENCOUNTER — Ambulatory Visit (HOSPITAL_BASED_OUTPATIENT_CLINIC_OR_DEPARTMENT_OTHER): Payer: Medicaid Other

## 2015-01-02 ENCOUNTER — Encounter: Payer: Self-pay | Admitting: Physician Assistant

## 2015-01-02 ENCOUNTER — Other Ambulatory Visit (HOSPITAL_BASED_OUTPATIENT_CLINIC_OR_DEPARTMENT_OTHER): Payer: Medicaid Other

## 2015-01-02 VITALS — BP 100/60 | HR 104 | Temp 97.9°F | Resp 18 | Ht 65.0 in | Wt 103.6 lb

## 2015-01-02 DIAGNOSIS — C3411 Malignant neoplasm of upper lobe, right bronchus or lung: Secondary | ICD-10-CM | POA: Diagnosis present

## 2015-01-02 DIAGNOSIS — C3490 Malignant neoplasm of unspecified part of unspecified bronchus or lung: Secondary | ICD-10-CM

## 2015-01-02 DIAGNOSIS — C7951 Secondary malignant neoplasm of bone: Secondary | ICD-10-CM | POA: Diagnosis not present

## 2015-01-02 DIAGNOSIS — Z5111 Encounter for antineoplastic chemotherapy: Secondary | ICD-10-CM

## 2015-01-02 DIAGNOSIS — C3491 Malignant neoplasm of unspecified part of right bronchus or lung: Secondary | ICD-10-CM

## 2015-01-02 LAB — COMPREHENSIVE METABOLIC PANEL (CC13)
ALBUMIN: 2.9 g/dL — AB (ref 3.5–5.0)
ALK PHOS: 91 U/L (ref 40–150)
ALT: 9 U/L (ref 0–55)
AST: 10 U/L (ref 5–34)
Anion Gap: 7 mEq/L (ref 3–11)
BUN: 13.5 mg/dL (ref 7.0–26.0)
CALCIUM: 8.8 mg/dL (ref 8.4–10.4)
CO2: 24 mEq/L (ref 22–29)
Chloride: 108 mEq/L (ref 98–109)
Creatinine: 0.8 mg/dL (ref 0.7–1.3)
EGFR: >90 mL/min/{1.73_m2} (ref >90)
GLUCOSE: 189 mg/dL — AB (ref 70–140)
Potassium: 3.8 mEq/L (ref 3.5–5.1)
Sodium: 139 mEq/L (ref 136–145)
Total Bilirubin: 0.33 mg/dL (ref 0.20–1.20)
Total Protein: 6.2 g/dL — ABNORMAL LOW (ref 6.4–8.3)

## 2015-01-02 LAB — CBC WITH DIFFERENTIAL/PLATELET
BASO%: 0.5 % (ref 0.0–2.0)
Basophils Absolute: 0 10*3/uL (ref 0.0–0.1)
EOS%: 2.1 % (ref 0.0–7.0)
Eosinophils Absolute: 0.1 10*3/uL (ref 0.0–0.5)
HEMATOCRIT: 25.6 % — AB (ref 38.4–49.9)
HEMOGLOBIN: 8.6 g/dL — AB (ref 13.0–17.1)
LYMPH#: 0.2 10*3/uL — AB (ref 0.9–3.3)
LYMPH%: 6.5 % — ABNORMAL LOW (ref 14.0–49.0)
MCH: 31.6 pg (ref 27.2–33.4)
MCHC: 33.7 g/dL (ref 32.0–36.0)
MCV: 93.7 fL (ref 79.3–98.0)
MONO#: 0.3 10*3/uL (ref 0.1–0.9)
MONO%: 10.3 % (ref 0.0–14.0)
NEUT%: 80.6 % — ABNORMAL HIGH (ref 39.0–75.0)
NEUTROS ABS: 2.6 10*3/uL (ref 1.5–6.5)
Platelets: 202 10*3/uL (ref 140–400)
RBC: 2.74 10*6/uL — ABNORMAL LOW (ref 4.20–5.82)
RDW: 14.5 % (ref 11.0–14.6)
WBC: 3.2 10*3/uL — AB (ref 4.0–10.3)

## 2015-01-02 MED ORDER — OXYCODONE-ACETAMINOPHEN 5-325 MG PO TABS: 1.0000 | ORAL_TABLET | Freq: Four times a day (QID) | ORAL | Status: DC | PRN

## 2015-01-02 MED ORDER — SODIUM CHLORIDE 0.9 % IV SOLN
Freq: Once | INTRAVENOUS | Status: AC
  Administered 2015-01-02: 13:00:00 via INTRAVENOUS
  Filled 2015-01-02: qty 8

## 2015-01-02 MED ORDER — SODIUM CHLORIDE 0.9 % IV SOLN
478.0000 mg | Freq: Once | INTRAVENOUS | Status: AC
  Administered 2015-01-02: 480 mg via INTRAVENOUS
  Filled 2015-01-02: qty 48

## 2015-01-02 MED ORDER — SODIUM CHLORIDE 0.9 % IV SOLN
Freq: Once | INTRAVENOUS | Status: AC
  Administered 2015-01-02: 13:00:00 via INTRAVENOUS

## 2015-01-02 MED ORDER — SODIUM CHLORIDE 0.9 % IV SOLN
1000.0000 mg/m2 | Freq: Once | INTRAVENOUS | Status: AC
  Administered 2015-01-02: 1520 mg via INTRAVENOUS
  Filled 2015-01-02: qty 39.98

## 2015-01-02 NOTE — Progress Notes (Addendum)
Adam Warner [phone]   Fax:([phone] [place], [place] [address]  DIAGNOSIS: Metastatic non-small cell lung cancer initially diagnosed as Unresectable a stage IIA (T1a, N1, M0) non-small cell lung cancer, invasive poorly differentiated carcinoma diagnosed in March 2016  PRIOR THERAPY:  1) A course of concurrent chemoradiation with weekly carboplatin for AUC of 2 and paclitaxel 45 MG/M2. 2) concurrent chemoradiation with weekly carboplatin for AUC of 2 and paclitaxel 45 mg/m2 resumed on 10/09/14. Completed his last dose on 10/24/14.  CURRENT THERAPY: Systemic chemotherapy with carboplatin for AUC of 5 on day 1 and gemcitabine 1000 MG/M2 on days 1 and 8 every 3 weeks. First dose on 12/12/2014. Status post 1 cycle  INTERVAL HISTORY: Adam Warner 59 y.o. male returns to the clinic today for follow-up visit accompanied by his niece. The patient continues to complain of increasing fatigue and weakness as well as lack of appetite and weight loss. The patient is currently undergoing systemic chemotherapy with carboplatin and gemcitabine status post 1 cycle. He denied having any significant chest pain, but has shortness breath with exertion with no cough or hemoptysis. He denied having any significant nausea or vomiting, no fever or chills. He is here today to start day 1 of cycle #2. He continues to have low back pain and currently on Percocet. He has some mild nausea that is well controlled with his current anti-emetic.  MEDICAL HISTORY: Past Medical History  Diagnosis Date  . COPD (chronic obstructive pulmonary disease)   . Stroke   . Hypertension   . Heart attack 1991  . Pulmonary nodule   . Coronary artery disease     sees Dr Spero Curb at [facility] every 2-3 years  . Shortness of breath dyspnea   . Full dentures   . Diabetes mellitus     Type Niddm x 2 years    ALLERGIES:  is allergic to  zyban.  MEDICATIONS:  Current Outpatient Prescriptions  Medication Sig Dispense Refill  . ACCU-CHEK AVIVA PLUS test strip USE TO CHECK ONCE OR TWICE DAILY 100 each 0  . ACCU-CHEK FASTCLIX LANCETS MISC Check blood sugar bid and prn 102 each 0  . aspirin EC 325 MG tablet Take 325 mg by mouth every morning. Hold for surgery    . atorvastatin (LIPITOR) 40 MG tablet TAKE ONE TABLET BY MOUTH ONE TIME DAILY 30 tablet 6  . Blood Glucose Monitoring Suppl (ONE TOUCH ULTRA 2) W/DEVICE KIT Use monitor to test blood sugar qd Dx 250.00 1 each 0  . Cholecalciferol (VITAMIN D) 2000 UNITS CAPS Take 1 capsule by mouth daily.    . feeding supplement, GLUCERNA SHAKE, (GLUCERNA SHAKE) LIQD Take 237 mLs by mouth 2 (two) times daily between meals. 60 Can 5  . metFORMIN (GLUCOPHAGE) 500 MG tablet Take 1 tablet (500 mg total) by mouth 2 (two) times daily with a meal. 60 tablet 2  . oxyCODONE-acetaminophen (PERCOCET/ROXICET) 5-325 MG per tablet Take 1 tablet by mouth every 6 (six) hours as needed for moderate pain or severe pain. 30 tablet 0  . prochlorperazine (COMPAZINE) 10 MG tablet Take 1 tablet (10 mg total) by mouth every 6 (six) hours as needed for nausea or vomiting. 30 tablet 0  . tiotropium (SPIRIVA) 18 MCG inhalation capsule Place 1 capsule (18 mcg total) into inhaler and inhale daily. 30 capsule 6  . valsartan (DIOVAN) 320 MG tablet Take 1 tablet (320  mg total) by mouth daily. 30 tablet 6   No current facility-administered medications for this visit.    SURGICAL HISTORY:  Past Surgical History  Procedure Laterality Date  . Lung surgery      Dr Arlyce Dice  . Angioplasty    . Colonoscopy  10/22/2011    Procedure: COLONOSCOPY;  Surgeon: Rogene Houston, MD;  Location: [facility];  Service: Endoscopy;  Laterality: N/A;  1200  . Carotid endarterectomy Right   . Video bronchoscopy with endobronchial ultrasound N/A 05/29/2014    Procedure: VIDEO BRONCHOSCOPY WITH ENDOBRONCHIAL ULTRASOUND;  Surgeon: Melrose Nakayama, MD;  Location: [facility];  Service: Thoracic;  Laterality: N/A;    REVIEW OF SYSTEMS:  Constitutional: negative Eyes: negative Ears, nose, mouth, throat, and face: negative Respiratory: positive for dyspnea on exertion Cardiovascular: negative Gastrointestinal: positive for nausea Genitourinary:negative Integument/breast: negative Hematologic/lymphatic: negative Musculoskeletal:negative Neurological: negative Behavioral/Psych: negative Endocrine: negative Allergic/Immunologic: negative   PHYSICAL EXAMINATION: General appearance: alert, cooperative and no distress Head: Normocephalic, without obvious abnormality, atraumatic Neck: no adenopathy, no JVD, supple, symmetrical, trachea midline and thyroid not enlarged, symmetric, no tenderness/mass/nodules Lymph nodes: Cervical, supraclavicular, and axillary nodes normal. Resp: clear to auscultation bilaterally Back: symmetric, no curvature. ROM normal. No CVA tenderness. Cardio: regular rate and rhythm, S1, S2 normal, no murmur, click, rub or gallop GI: soft, non-tender; bowel sounds normal; no masses,  no organomegaly Extremities: extremities normal, atraumatic, no cyanosis or edema Neurologic: Alert and oriented X 3, normal strength and tone. Normal symmetric reflexes. Normal coordination and gait  ECOG PERFORMANCE STATUS: 1 - Symptomatic but completely ambulatory  Blood pressure 100/60, pulse 104, temperature 97.9 F (36.6 C), temperature source Oral, resp. rate 18, height $RemoveBe'5\' 5"'oLriaqIiq$  (1.651 m), weight 103 lb 9.6 oz (46.993 kg), SpO2 100 %.  LABORATORY DATA: Lab Results  Component Value Date   WBC 3.2* 01/02/2015   HGB 8.6* 01/02/2015   HCT 25.6* 01/02/2015   MCV 93.7 01/02/2015   PLT 202 01/02/2015      Chemistry      Component Value Date/Time   NA 139 01/02/2015 1057   NA 136 05/23/2014 1015   NA 139 04/12/2014 1029   K 3.8 01/02/2015 1057   K 4.1 05/23/2014 1015   CL 106 05/23/2014 1015   CO2 24 01/02/2015  1057   CO2 25 05/23/2014 1015   BUN 13.5 01/02/2015 1057   BUN 10 05/23/2014 1015   BUN 12 04/12/2014 1029   CREATININE 0.8 01/02/2015 1057   CREATININE 1.07 05/23/2014 1015   CREATININE 1.05 11/04/2012 1242      Component Value Date/Time   CALCIUM 8.8 01/02/2015 1057   CALCIUM 9.3 05/23/2014 1015   ALKPHOS 91 01/02/2015 1057   ALKPHOS 90 05/23/2014 1015   AST 10 01/02/2015 1057   AST 19 05/23/2014 1015   ALT 9 01/02/2015 1057   ALT 17 05/23/2014 1015   BILITOT 0.33 01/02/2015 1057   BILITOT 0.6 05/23/2014 1015       RADIOGRAPHIC STUDIES: No results found.  ASSESSMENT AND PLAN: This is a very pleasant 59 years old white male with history of stage IIA non-small cell lung cancer status post a course of concurrent chemoradiation with weekly carboplatin and paclitaxel. The recent CT scan of the chest showed interval decrease in the size of the left hilar mass and adenopathy. He is currently undergoing systemic chemotherapy with carboplatin and gemcitabine status post 1 cycle. He is tolerating the treatment well except for fatigue. The patient was  discussed with and also seen by Dr. Julien Nordmann. He will proceed with cycle #2 today as scheduled. He will follow up in 3 weeks prior to the start of cycle #3. recommended for him to proceed with day 8 of the first cycle as a scheduled. He would come back for follow-up visit in 2 weeks for reevaluation before starting cycle #2 of his treatment. He will continue on Percocet for the back pain and was given a refill for the Percocet today.Marland Kitchen He was advised to call immediately if he has any concerning symptoms in the interval. The patient was encouraged to quit smoking and we offered him to smoke cessation program. The patient voices understanding of current disease status and treatment options and is in agreement with the current care plan.  All questions were answered. The patient knows to call the clinic with any problems, questions or concerns. We  can certainly see the patient much sooner if necessary.  Carlton Adam, PA-C 01/02/2015   ADDENDUM: Hematology/oncology Attending: I had a face to face encounter with the patient. I recommended his care plan. This is a very pleasant 59 years old white male with metastatic non-small cell lung cancer, squamous cell carcinoma is currently undergoing systemic chemotherapy with carboplatin and gemcitabine is status post 1 cycle. He tolerated the first cycle of his treatment well except for fatigue and the baseline shortness of breath and dry cough. I recommended for the patient to proceed with cycle #2 today as scheduled. The patient would come back for follow-up visit in 3 weeks for reevaluation before starting cycle #3. For the low back pain the patient was given a refill of Percocet today. He was advised to quit. He if he has any concerning symptoms in the interval.  Disclaimer: This note was dictated with voice recognition software. Similar sounding words can inadvertently be transcribed and may be missed upon review. Eilleen Kempf., MD 01/17/2015

## 2015-01-09 ENCOUNTER — Ambulatory Visit (HOSPITAL_BASED_OUTPATIENT_CLINIC_OR_DEPARTMENT_OTHER): Payer: Medicaid Other

## 2015-01-09 ENCOUNTER — Telehealth: Payer: Self-pay | Admitting: *Deleted

## 2015-01-09 ENCOUNTER — Other Ambulatory Visit: Payer: Self-pay | Admitting: Medical Oncology

## 2015-01-09 ENCOUNTER — Ambulatory Visit: Payer: Medicaid Other | Admitting: Nutrition

## 2015-01-09 VITALS — BP 122/73 | HR 98 | Temp 98.2°F | Resp 18

## 2015-01-09 DIAGNOSIS — C3490 Malignant neoplasm of unspecified part of unspecified bronchus or lung: Secondary | ICD-10-CM

## 2015-01-09 DIAGNOSIS — C3411 Malignant neoplasm of upper lobe, right bronchus or lung: Secondary | ICD-10-CM | POA: Diagnosis not present

## 2015-01-09 DIAGNOSIS — Z5111 Encounter for antineoplastic chemotherapy: Secondary | ICD-10-CM | POA: Diagnosis not present

## 2015-01-09 DIAGNOSIS — C3491 Malignant neoplasm of unspecified part of right bronchus or lung: Secondary | ICD-10-CM

## 2015-01-09 LAB — CBC WITH DIFFERENTIAL/PLATELET
BASO%: 1.3 % (ref 0.0–2.0)
Basophils Absolute: 0 10*3/uL (ref 0.0–0.1)
EOS%: 1.1 % (ref 0.0–7.0)
Eosinophils Absolute: 0 10*3/uL (ref 0.0–0.5)
HCT: 24.4 % — ABNORMAL LOW (ref 38.4–49.9)
HEMOGLOBIN: 8.2 g/dL — AB (ref 13.0–17.1)
LYMPH#: 0.2 10*3/uL — AB (ref 0.9–3.3)
LYMPH%: 11.4 % — ABNORMAL LOW (ref 14.0–49.0)
MCH: 31.3 pg (ref 27.2–33.4)
MCHC: 33.4 g/dL (ref 32.0–36.0)
MCV: 93.7 fL (ref 79.3–98.0)
MONO#: 0.1 10*3/uL (ref 0.1–0.9)
MONO%: 3.3 % (ref 0.0–14.0)
NEUT%: 82.9 % — ABNORMAL HIGH (ref 39.0–75.0)
NEUTROS ABS: 1.6 10*3/uL (ref 1.5–6.5)
PLATELETS: 205 10*3/uL (ref 140–400)
RBC: 2.61 10*6/uL — ABNORMAL LOW (ref 4.20–5.82)
RDW: 15 % — AB (ref 11.0–14.6)
WBC: 2 10*3/uL — ABNORMAL LOW (ref 4.0–10.3)

## 2015-01-09 MED ORDER — PROCHLORPERAZINE MALEATE 10 MG PO TABS
10.0000 mg | ORAL_TABLET | Freq: Once | ORAL | Status: AC
  Administered 2015-01-09: 10 mg via ORAL

## 2015-01-09 MED ORDER — OXYCODONE-ACETAMINOPHEN 5-325 MG PO TABS
ORAL_TABLET | ORAL | Status: AC
  Filled 2015-01-09: qty 1

## 2015-01-09 MED ORDER — PROCHLORPERAZINE MALEATE 10 MG PO TABS
ORAL_TABLET | ORAL | Status: AC
Start: 2015-01-09 — End: 2015-01-09
  Filled 2015-01-09: qty 1

## 2015-01-09 MED ORDER — OXYCODONE-ACETAMINOPHEN 5-325 MG PO TABS
1.0000 | ORAL_TABLET | ORAL | Status: AC
  Administered 2015-01-09: 1 via ORAL

## 2015-01-09 MED ORDER — SODIUM CHLORIDE 0.9 % IV SOLN
1000.0000 mg/m2 | Freq: Once | INTRAVENOUS | Status: AC
  Administered 2015-01-09: 1520 mg via INTRAVENOUS
  Filled 2015-01-09: qty 39.98

## 2015-01-09 MED ORDER — SODIUM CHLORIDE 0.9 % IV SOLN
Freq: Once | INTRAVENOUS | Status: AC
  Administered 2015-01-09: 15:00:00 via INTRAVENOUS

## 2015-01-09 NOTE — Progress Notes (Signed)
Patient identified to be at risk for malnutrition on the MST secondary to weight loss and poor appetite.  59 year old male diagnosed with NSCLC.  PMH includes COPD, Stroke, HTN, MI, CAD, DM, Full dentures.  Medications include lipitor, vitamin D, Glucophage, compazine.  Labs include Glucose 189 and Alb 2.9 on 01-02-15.  Height: 65 inches. Weight: 103.6 pounds UBW: 125 pounds. BMI: 17.24  Patient reports poor appetite and decreased oral intake. States he doesn't drink any water and is not willing to try. Eats only small amounts. Receiving concurrent chemo radiation treatments. Occasionally drinks Glucerna.  Nutrition Diagnosis:  Unintended weight loss related to decreased oral intake as evidenced by 17% weight loss in 7 months.  Intervention:  Educated patient to increase Glucerna to QID and try to increase liquids. Recommended patient add some snacks throughout the day. Provided a sample of Glucerna and coupons. Fact sheets provided and teach back method used.  Monitoring, Evaluation, Goals:  Increase calories and protein to promote weight gain.  Next Visit: Tuesday, October 4, during infusion.

## 2015-01-09 NOTE — Telephone Encounter (Signed)
Per patient request I have moved 10/4 appts to later in the day

## 2015-01-09 NOTE — Patient Instructions (Signed)
[facility] Discharge Instructions for Patients Receiving Chemotherapy  Today you received the following chemotherapy agents Gemzar  To help prevent nausea and vomiting after your treatment, we encourage you to take your nausea medication as directed.    If you develop nausea and vomiting that is not controlled by your nausea medication, call the clinic.   BELOW ARE SYMPTOMS THAT SHOULD BE REPORTED IMMEDIATELY:  *FEVER GREATER THAN 100.5 F  *CHILLS WITH OR WITHOUT FEVER  NAUSEA AND VOMITING THAT IS NOT CONTROLLED WITH YOUR NAUSEA MEDICATION  *UNUSUAL SHORTNESS OF BREATH  *UNUSUAL BRUISING OR BLEEDING  TENDERNESS IN MOUTH AND THROAT WITH OR WITHOUT PRESENCE OF ULCERS  *URINARY PROBLEMS  *BOWEL PROBLEMS  UNUSUAL RASH Items with * indicate a potential emergency and should be followed up as soon as possible.  Feel free to call the clinic you have any questions or concerns. The clinic phone number is [phone].  Please show the CHEMO ALERT CARD at check-in to the Emergency Department and triage nurse.   

## 2015-01-16 NOTE — Patient Instructions (Signed)
Continue labs and chemotherapy as scheduled Follow up in 3 weeks, prior to your next scheduled cycle of chemotherapy 

## 2015-01-23 ENCOUNTER — Ambulatory Visit: Payer: Medicaid Other | Admitting: Nutrition

## 2015-01-23 ENCOUNTER — Telehealth: Payer: Self-pay | Admitting: Internal Medicine

## 2015-01-23 ENCOUNTER — Other Ambulatory Visit: Payer: Self-pay | Admitting: Medical Oncology

## 2015-01-23 ENCOUNTER — Ambulatory Visit (HOSPITAL_COMMUNITY)
Admission: RE | Admit: 2015-01-23 | Discharge: 2015-01-23 | Disposition: A | Discharge status: Home / Self Care | Payer: Medicaid Other | Source: Ambulatory Visit | Attending: Internal Medicine | Admitting: Internal Medicine

## 2015-01-23 ENCOUNTER — Encounter: Payer: Self-pay | Admitting: Internal Medicine

## 2015-01-23 ENCOUNTER — Other Ambulatory Visit: Payer: Self-pay | Admitting: Internal Medicine

## 2015-01-23 ENCOUNTER — Ambulatory Visit (HOSPITAL_BASED_OUTPATIENT_CLINIC_OR_DEPARTMENT_OTHER): Payer: Medicaid Other | Admitting: Internal Medicine

## 2015-01-23 ENCOUNTER — Other Ambulatory Visit (HOSPITAL_BASED_OUTPATIENT_CLINIC_OR_DEPARTMENT_OTHER): Payer: Medicaid Other

## 2015-01-23 ENCOUNTER — Ambulatory Visit (HOSPITAL_BASED_OUTPATIENT_CLINIC_OR_DEPARTMENT_OTHER): Payer: Medicaid Other

## 2015-01-23 VITALS — BP 120/72 | HR 98 | Temp 98.0°F | Resp 28

## 2015-01-23 DIAGNOSIS — D6481 Anemia due to antineoplastic chemotherapy: Secondary | ICD-10-CM

## 2015-01-23 DIAGNOSIS — T451X5A Adverse effect of antineoplastic and immunosuppressive drugs, initial encounter: Secondary | ICD-10-CM

## 2015-01-23 DIAGNOSIS — M549 Dorsalgia, unspecified: Secondary | ICD-10-CM

## 2015-01-23 DIAGNOSIS — M545 Low back pain: Secondary | ICD-10-CM

## 2015-01-23 DIAGNOSIS — Z66 Do not resuscitate: Secondary | ICD-10-CM | POA: Insufficient documentation

## 2015-01-23 DIAGNOSIS — C3491 Malignant neoplasm of unspecified part of right bronchus or lung: Secondary | ICD-10-CM

## 2015-01-23 DIAGNOSIS — C3411 Malignant neoplasm of upper lobe, right bronchus or lung: Secondary | ICD-10-CM

## 2015-01-23 DIAGNOSIS — Z23 Encounter for immunization: Secondary | ICD-10-CM

## 2015-01-23 DIAGNOSIS — C3490 Malignant neoplasm of unspecified part of unspecified bronchus or lung: Secondary | ICD-10-CM

## 2015-01-23 DIAGNOSIS — Z5111 Encounter for antineoplastic chemotherapy: Secondary | ICD-10-CM | POA: Diagnosis present

## 2015-01-23 HISTORY — DX: Do not resuscitate: Z66

## 2015-01-23 LAB — COMPREHENSIVE METABOLIC PANEL (CC13)
ALBUMIN: 3.3 g/dL — AB (ref 3.5–5.0)
ALK PHOS: 99 U/L (ref 40–150)
ALT: 11 U/L (ref 0–55)
ANION GAP: 10 meq/L (ref 3–11)
AST: 12 U/L (ref 5–34)
BUN: 13.9 mg/dL (ref 7.0–26.0)
CO2: 22 mEq/L (ref 22–29)
Calcium: 9.2 mg/dL (ref 8.4–10.4)
Chloride: 107 mEq/L (ref 98–109)
Creatinine: 1 mg/dL (ref 0.7–1.3)
EGFR: 81 mL/min/{1.73_m2} — AB (ref >90)
Glucose: 121 mg/dl (ref 70–140)
Potassium: 4.2 mEq/L (ref 3.5–5.1)
Sodium: 139 mEq/L (ref 136–145)
Total Bilirubin: 0.34 mg/dL (ref 0.20–1.20)
Total Protein: 6.8 g/dL (ref 6.4–8.3)

## 2015-01-23 LAB — ABO/RH: ABO/RH(D): O POS

## 2015-01-23 LAB — CBC WITH DIFFERENTIAL/PLATELET
BASO%: 0.5 % (ref 0.0–2.0)
Basophils Absolute: 0 10*3/uL (ref 0.0–0.1)
EOS ABS: 0 10*3/uL (ref 0.0–0.5)
EOS%: 0.6 % (ref 0.0–7.0)
HCT: 23.2 % — ABNORMAL LOW (ref 38.4–49.9)
HEMOGLOBIN: 7.7 g/dL — AB (ref 13.0–17.1)
LYMPH#: 0.2 10*3/uL — AB (ref 0.9–3.3)
LYMPH%: 7.6 % — ABNORMAL LOW (ref 14.0–49.0)
MCH: 31.7 pg (ref 27.2–33.4)
MCHC: 33.4 g/dL (ref 32.0–36.0)
MCV: 95 fL (ref 79.3–98.0)
MONO#: 0.5 10*3/uL (ref 0.1–0.9)
MONO%: 18.1 % — ABNORMAL HIGH (ref 0.0–14.0)
NEUT#: 2.2 10*3/uL (ref 1.5–6.5)
NEUT%: 73.2 % (ref 39.0–75.0)
Platelets: 171 10*3/uL (ref 140–400)
RBC: 2.44 10*6/uL — ABNORMAL LOW (ref 4.20–5.82)
RDW: 16.7 % — AB (ref 11.0–14.6)
WBC: 3 10*3/uL — ABNORMAL LOW (ref 4.0–10.3)

## 2015-01-23 LAB — PREPARE RBC (CROSSMATCH)

## 2015-01-23 MED ORDER — SODIUM CHLORIDE 0.9 % IV SOLN
250.0000 mL | Freq: Once | INTRAVENOUS | Status: AC
Start: 2015-01-23 — End: 2015-01-23

## 2015-01-23 MED ORDER — OXYCODONE-ACETAMINOPHEN 5-325 MG PO TABS
ORAL_TABLET | ORAL | Status: AC
  Filled 2015-01-23: qty 1

## 2015-01-23 MED ORDER — SODIUM CHLORIDE 0.9 % IV SOLN
385.0000 mg | Freq: Once | INTRAVENOUS | Status: AC
  Administered 2015-01-23: 390 mg via INTRAVENOUS
  Filled 2015-01-23: qty 39

## 2015-01-23 MED ORDER — DIPHENHYDRAMINE HCL 25 MG PO CAPS
ORAL_CAPSULE | ORAL | Status: AC
  Filled 2015-01-23: qty 1

## 2015-01-23 MED ORDER — SODIUM CHLORIDE 0.9 % IV SOLN
1000.0000 mg/m2 | Freq: Once | INTRAVENOUS | Status: AC
  Administered 2015-01-23: 1520 mg via INTRAVENOUS
  Filled 2015-01-23: qty 39.98

## 2015-01-23 MED ORDER — SODIUM CHLORIDE 0.9 % IV SOLN
Freq: Once | INTRAVENOUS | Status: AC
  Administered 2015-01-23: 11:00:00 via INTRAVENOUS

## 2015-01-23 MED ORDER — DIPHENHYDRAMINE HCL 25 MG PO CAPS
25.0000 mg | ORAL_CAPSULE | Freq: Once | ORAL | Status: AC
  Administered 2015-01-23: 25 mg via ORAL

## 2015-01-23 MED ORDER — SODIUM CHLORIDE 0.9 % IV SOLN
1000.0000 mg/m2 | Freq: Once | INTRAVENOUS | Status: DC
  Filled 2015-01-23: qty 40

## 2015-01-23 MED ORDER — SODIUM CHLORIDE 0.9 % IV SOLN
Freq: Once | INTRAVENOUS | Status: AC
  Administered 2015-01-23: 11:00:00 via INTRAVENOUS
  Filled 2015-01-23: qty 8

## 2015-01-23 MED ORDER — ACETAMINOPHEN 325 MG PO TABS
ORAL_TABLET | ORAL | Status: AC
  Filled 2015-01-23: qty 2

## 2015-01-23 MED ORDER — OXYCODONE-ACETAMINOPHEN 5-325 MG PO TABS
1.0000 | ORAL_TABLET | Freq: Once | ORAL | Status: AC
  Administered 2015-01-23: 1 via ORAL

## 2015-01-23 MED ORDER — ACETAMINOPHEN 325 MG PO TABS
650.0000 mg | ORAL_TABLET | Freq: Once | ORAL | Status: AC
  Administered 2015-01-23: 650 mg via ORAL

## 2015-01-23 NOTE — Progress Notes (Signed)
Hemoglobin today of 7.7. Pt symtomatic with fatigue, dyspnea abd shortness of breath. Spoke with Dr. Julien Nordmann, continue with treatment today and pt to receive 2 units of blood today.   Dr. Julien Nordmann at chairside.  Pt c/o back pain, forgot to take percocet this morning. Spoke with Dr. Julien Nordmann, received order for 1 tablet.

## 2015-01-23 NOTE — Progress Notes (Signed)
har done 

## 2015-01-23 NOTE — Progress Notes (Signed)
[facility] [phone]   Fax:([phone]) [place], [place] [address]  DIAGNOSIS: Metastatic non-small cell lung cancer initially diagnosed as Unresectable a stage IIA (T1a, N1, M0) non-small cell lung cancer, invasive poorly differentiated carcinoma diagnosed in March 2016  PRIOR THERAPY:  1) A course of concurrent chemoradiation with weekly carboplatin for AUC of 2 and paclitaxel 45 MG/M2. 2) concurrent chemoradiation with weekly carboplatin for AUC of 2 and paclitaxel 45 mg/m2 resumed on 10/09/14. Completed his last dose on 10/24/14.  CURRENT THERAPY: Systemic chemotherapy with carboplatin for AUC of 5 on day 1 and gemcitabine 1000 MG/M2 on days 1 and 8 every 3 weeks. First dose on 12/12/2014. Status post 2 cycles.  INTERVAL HISTORY: Adam Warner 59 y.o. male returns to the clinic today for follow-up visit. The patient continues to complain of increasing fatigue and weakness as well as lack of appetite and weight loss. The patient is currently undergoing systemic chemotherapy with carboplatin and gemcitabine status post 2 cycles. He denied having any significant chest pain, but has shortness breath with exertion with no cough or hemoptysis. He denied having any significant nausea or vomiting, no fever or chills. He is here today to start cycle #3.  MEDICAL HISTORY: Past Medical History  Diagnosis Date  . COPD (chronic obstructive pulmonary disease)   . Stroke   . Hypertension   . Heart attack 1991  . Pulmonary nodule   . Coronary artery disease     sees Dr Spero Curb at [facility] every 2-3 years  . Shortness of breath dyspnea   . Full dentures   . Diabetes mellitus     Type Niddm x 2 years    ALLERGIES:  is allergic to zyban.  MEDICATIONS:  Current Outpatient Prescriptions  Medication Sig Dispense Refill  . ACCU-CHEK AVIVA PLUS test strip USE TO CHECK ONCE OR TWICE DAILY 100 each 0  .  ACCU-CHEK FASTCLIX LANCETS MISC Check blood sugar bid and prn 102 each 0  . aspirin EC 325 MG tablet Take 325 mg by mouth every morning. Hold for surgery    . atorvastatin (LIPITOR) 40 MG tablet TAKE ONE TABLET BY MOUTH ONE TIME DAILY 30 tablet 6  . Blood Glucose Monitoring Suppl (ONE TOUCH ULTRA 2) W/DEVICE KIT Use monitor to test blood sugar qd Dx 250.00 1 each 0  . Cholecalciferol (VITAMIN D) 2000 UNITS CAPS Take 1 capsule by mouth daily.    . feeding supplement, GLUCERNA SHAKE, (GLUCERNA SHAKE) LIQD Take 237 mLs by mouth 2 (two) times daily between meals. 60 Can 5  . metFORMIN (GLUCOPHAGE) 500 MG tablet Take 1 tablet (500 mg total) by mouth 2 (two) times daily with a meal. 60 tablet 2  . oxyCODONE-acetaminophen (PERCOCET/ROXICET) 5-325 MG per tablet Take 1 tablet by mouth every 6 (six) hours as needed for moderate pain or severe pain. 30 tablet 0  . prochlorperazine (COMPAZINE) 10 MG tablet Take 1 tablet (10 mg total) by mouth every 6 (six) hours as needed for nausea or vomiting. 30 tablet 0  . tiotropium (SPIRIVA) 18 MCG inhalation capsule Place 1 capsule (18 mcg total) into inhaler and inhale daily. 30 capsule 6  . valsartan (DIOVAN) 320 MG tablet Take 1 tablet (320 mg total) by mouth daily. 30 tablet 6   No current facility-administered medications for this visit.    SURGICAL HISTORY:  Past Surgical History  Procedure Laterality Date  .  Lung surgery      Dr Arlyce Dice  . Angioplasty    . Colonoscopy  10/22/2011    Procedure: COLONOSCOPY;  Surgeon: Rogene Houston, MD;  Location: [facility];  Service: Endoscopy;  Laterality: N/A;  1200  . Carotid endarterectomy Right   . Video bronchoscopy with endobronchial ultrasound N/A 05/29/2014    Procedure: VIDEO BRONCHOSCOPY WITH ENDOBRONCHIAL ULTRASOUND;  Surgeon: Melrose Nakayama, MD;  Location: [facility];  Service: Thoracic;  Laterality: N/A;    REVIEW OF SYSTEMS:  Constitutional: negative Eyes: negative Ears, nose, mouth, throat, and face:  negative Respiratory: positive for dyspnea on exertion Cardiovascular: negative Gastrointestinal: negative Genitourinary:negative Integument/breast: negative Hematologic/lymphatic: negative Musculoskeletal:negative Neurological: negative Behavioral/Psych: negative Endocrine: negative Allergic/Immunologic: negative   PHYSICAL EXAMINATION: General appearance: alert, cooperative and no distress Head: Normocephalic, without obvious abnormality, atraumatic Neck: no adenopathy, no JVD, supple, symmetrical, trachea midline and thyroid not enlarged, symmetric, no tenderness/mass/nodules Lymph nodes: Cervical, supraclavicular, and axillary nodes normal. Resp: clear to auscultation bilaterally Back: symmetric, no curvature. ROM normal. No CVA tenderness. Cardio: regular rate and rhythm, S1, S2 normal, no murmur, click, rub or gallop GI: soft, non-tender; bowel sounds normal; no masses,  no organomegaly Extremities: extremities normal, atraumatic, no cyanosis or edema Neurologic: Alert and oriented X 3, normal strength and tone. Normal symmetric reflexes. Normal coordination and gait  ECOG PERFORMANCE STATUS: 1 - Symptomatic but completely ambulatory  There were no vitals taken for this visit.  LABORATORY DATA: Lab Results  Component Value Date   WBC 3.0* 01/23/2015   HGB 7.7* 01/23/2015   HCT 23.2* 01/23/2015   MCV 95.0 01/23/2015   PLT 171 01/23/2015      Chemistry      Component Value Date/Time   NA 139 01/02/2015 1057   NA 136 05/23/2014 1015   NA 139 04/12/2014 1029   K 3.8 01/02/2015 1057   K 4.1 05/23/2014 1015   CL 106 05/23/2014 1015   CO2 24 01/02/2015 1057   CO2 25 05/23/2014 1015   BUN 13.5 01/02/2015 1057   BUN 10 05/23/2014 1015   BUN 12 04/12/2014 1029   CREATININE 0.8 01/02/2015 1057   CREATININE 1.07 05/23/2014 1015   CREATININE 1.05 11/04/2012 1242      Component Value Date/Time   CALCIUM 8.8 01/02/2015 1057   CALCIUM 9.3 05/23/2014 1015   ALKPHOS 91  01/02/2015 1057   ALKPHOS 90 05/23/2014 1015   AST 10 01/02/2015 1057   AST 19 05/23/2014 1015   ALT 9 01/02/2015 1057   ALT 17 05/23/2014 1015   BILITOT 0.33 01/02/2015 1057   BILITOT 0.6 05/23/2014 1015       RADIOGRAPHIC STUDIES: No results found.  ASSESSMENT AND PLAN: This is a very pleasant 59years old white male with history of stage IIA non-small cell lung cancer status post a course of concurrent chemoradiation with weekly carboplatin and paclitaxel. The recent CT scan of the chest showed interval decrease in the size of the left hilar mass and adenopathy. He is currently undergoing systemic chemotherapy with carboplatin and gemcitabine status post 2 ycles He tolerated the treatment well except for fatigue secondary to chemotherapy-induced anemia. I recommended for the patient to proceed with cycle #3 today as scheduled. I will arrange for him to come back for follow-up visit in 3 weeks after repeating CT scan of the chest, abdomen and pelvis for restaging of his disease. For the chemotherapy-induced anemia, we will arrange for the patient to receive 2 units of PRBCs  transfusion. He would continue on Percocet for the back pain. CODE STATUS was discussed with the patient and he indicated no CODE BLUE He was advised to call immediately if he has any concerning symptoms in the interval. The patient was encouraged to quit smoking and we offered him to smoke cessation program. The patient voices understanding of current disease status and treatment options and is in agreement with the current care plan.  All questions were answered. The patient knows to call the clinic with any problems, questions or concerns. We can certainly see the patient much sooner if necessary.  Disclaimer: This note was dictated with voice recognition software. Similar sounding words can inadvertently be transcribed and may not be corrected upon review.

## 2015-01-23 NOTE — Telephone Encounter (Signed)
s.w. pt and advised on 10.5 ok and aware

## 2015-01-23 NOTE — Patient Instructions (Addendum)
[facility] Discharge Instructions for Patients Receiving Chemotherapy  Today you received the following chemotherapy agents gemzar, carboplatin  To help prevent nausea and vomiting after your treatment, we encourage you to take your nausea medication   If you develop nausea and vomiting that is not controlled by your nausea medication, call the clinic.   BELOW ARE SYMPTOMS THAT SHOULD BE REPORTED IMMEDIATELY:  *FEVER GREATER THAN 100.5 F  *CHILLS WITH OR WITHOUT FEVER  NAUSEA AND VOMITING THAT IS NOT CONTROLLED WITH YOUR NAUSEA MEDICATION  *UNUSUAL SHORTNESS OF BREATH  *UNUSUAL BRUISING OR BLEEDING  TENDERNESS IN MOUTH AND THROAT WITH OR WITHOUT PRESENCE OF ULCERS  *URINARY PROBLEMS  *BOWEL PROBLEMS  UNUSUAL RASH Items with * indicate a potential emergency and should be followed up as soon as possible.  Feel free to call the clinic you have any questions or concerns. The clinic phone number is [phone].  Please show the [facility] at check-in to the Emergency Department and triage nurse.  Blood Transfusion Information WHAT IS A BLOOD TRANSFUSION? A transfusion is the replacement of blood or some of its parts. Blood is made up of multiple cells which provide different functions.  Red blood cells carry oxygen and are used for blood loss replacement.  White blood cells fight against infection.  Platelets control bleeding.  Plasma helps clot blood.  Other blood products are available for specialized needs, such as hemophilia or other clotting disorders. BEFORE THE TRANSFUSION  Who gives blood for transfusions?   You may be able to donate blood to be used at a later date on yourself (autologous donation).  Relatives can be asked to donate blood. This is generally not any safer than if you have received blood from a stranger. The same precautions are taken to ensure safety when a relative's blood is donated.  Healthy volunteers who are fully  evaluated to make sure their blood is safe. This is blood bank blood. Transfusion therapy is the safest it has ever been in the practice of medicine. Before blood is taken from a donor, a complete history is taken to make sure that person has no history of diseases nor engages in risky social behavior (examples are intravenous drug use or sexual activity with multiple partners). The donor's travel history is screened to minimize risk of transmitting infections, such as malaria. The donated blood is tested for signs of infectious diseases, such as HIV and hepatitis. The blood is then tested to be sure it is compatible with you in order to minimize the chance of a transfusion reaction. If you or a relative donates blood, this is often done in anticipation of surgery and is not appropriate for emergency situations. It takes many days to process the donated blood. RISKS AND COMPLICATIONS Although transfusion therapy is very safe and saves many lives, the main dangers of transfusion include:   Getting an infectious disease.  Developing a transfusion reaction. This is an allergic reaction to something in the blood you were given. Every precaution is taken to prevent this. The decision to have a blood transfusion has been considered carefully by your caregiver before blood is given. Blood is not given unless the benefits outweigh the risks. AFTER THE TRANSFUSION  Right after receiving a blood transfusion, you will usually feel much better and more energetic. This is especially true if your red blood cells have gotten low (anemic). The transfusion raises the level of the red blood cells which carry oxygen, and this usually  causes an energy increase.  The nurse administering the transfusion will monitor you carefully for complications. HOME CARE INSTRUCTIONS  No special instructions are needed after a transfusion. You may find your energy is better. Speak with your caregiver about any limitations on activity  for underlying diseases you may have. SEEK MEDICAL CARE IF:   Your condition is not improving after your transfusion.  You develop redness or irritation at the intravenous (IV) site. SEEK IMMEDIATE MEDICAL CARE IF:  Any of the following symptoms occur over the next 12 hours:  Shaking chills.  You have a temperature by mouth above 102 F (38.9 C), not controlled by medicine.  Chest, back, or muscle pain.  People around you feel you are not acting correctly or are confused.  Shortness of breath or difficulty breathing.  Dizziness and fainting.  You get a rash or develop hives.  You have a decrease in urine output.  Your urine turns a dark color or changes to pink, red, or brown. Any of the following symptoms occur over the next 10 days:  You have a temperature by mouth above 102 F (38.9 C), not controlled by medicine.  Shortness of breath.  Weakness after normal activity.  The white part of the eye turns yellow (jaundice).  You have a decrease in the amount of urine or are urinating less often.  Your urine turns a dark color or changes to pink, red, or brown. Document Released: 04/04/2000 Document Revised: 06/30/2011 Document Reviewed: 11/22/2007 [facility] Patient Information 2015 [place], [place]. This information is not intended to replace advice given to you by your health care provider. Make sure you discuss any questions you have with your health care provider.

## 2015-01-23 NOTE — Progress Notes (Signed)
Nutrition follow-up completed with patient during chemotherapy for non-small cell lung cancer. Last weight documented with 103.6 pounds September 13. Patient reports poor appetite continues. Patient still has not increased water intake but he does drink other liquids. Reports he drinks Glucerna twice a day.  Nutrition diagnosis: Unintended weight loss cannot be evaluated.  Intervention: Reviewed importance of increased nutrition and recommended patient increase Glucerna 3 times a day Recommended patient continue increased liquids for hydration. Provided coupons for patient to purchase Glucerna. Teach back method used.  Monitoring, evaluation, goals: Patient will work to increase calories and protein to promote weight gain.  Next visit: Tuesday, November 1 during infusion.  **Disclaimer: This note was dictated with voice recognition software. Similar sounding words can inadvertently be transcribed and this note may contain transcription errors which may not have been corrected upon publication of note.**

## 2015-01-24 ENCOUNTER — Ambulatory Visit (HOSPITAL_COMMUNITY)
Admission: RE | Admit: 2015-01-24 | Discharge: 2015-01-24 | Disposition: A | Discharge status: Home / Self Care | Payer: Medicaid Other | Source: Ambulatory Visit | Attending: Internal Medicine | Admitting: Internal Medicine

## 2015-01-24 VITALS — BP 111/63 | HR 88 | Temp 98.7°F | Resp 20

## 2015-01-24 DIAGNOSIS — T451X5A Adverse effect of antineoplastic and immunosuppressive drugs, initial encounter: Principal | ICD-10-CM

## 2015-01-24 DIAGNOSIS — D6481 Anemia due to antineoplastic chemotherapy: Secondary | ICD-10-CM

## 2015-01-24 LAB — PREPARE RBC (CROSSMATCH)

## 2015-01-24 MED ORDER — DIPHENHYDRAMINE HCL 25 MG PO CAPS
25.0000 mg | ORAL_CAPSULE | Freq: Once | ORAL | Status: AC
Start: 2015-01-24 — End: 2015-01-24
  Administered 2015-01-24: 25 mg via ORAL
  Filled 2015-01-24: qty 1

## 2015-01-24 MED ORDER — ACETAMINOPHEN 325 MG PO TABS
650.0000 mg | ORAL_TABLET | Freq: Once | ORAL | Status: AC
  Administered 2015-01-24: 650 mg via ORAL
  Filled 2015-01-24: qty 2

## 2015-01-24 MED ORDER — SODIUM CHLORIDE 0.9 % IV SOLN
250.0000 mL | Freq: Once | INTRAVENOUS | Status: AC
Start: 2015-01-24 — End: 2015-01-24
  Administered 2015-01-24: 250 mL via INTRAVENOUS

## 2015-01-24 NOTE — Procedures (Signed)
Associated diagnosis: Antineoplastic chemotherapy induced anemia MD: E. 933.1  Procedure Note: Infusion 1 unit PRB's  Condition during procedure: Tolerated well Condition after procedure: Alert, oriented, and ambulatory

## 2015-01-25 LAB — TYPE AND SCREEN
ABO/RH(D): O POS
Antibody Screen: NEGATIVE
Unit division: 0
Unit division: 0

## 2015-01-30 ENCOUNTER — Ambulatory Visit (HOSPITAL_BASED_OUTPATIENT_CLINIC_OR_DEPARTMENT_OTHER): Payer: Medicaid Other

## 2015-01-30 ENCOUNTER — Other Ambulatory Visit (HOSPITAL_BASED_OUTPATIENT_CLINIC_OR_DEPARTMENT_OTHER): Payer: Medicaid Other

## 2015-01-30 ENCOUNTER — Encounter: Payer: Self-pay | Admitting: Internal Medicine

## 2015-01-30 ENCOUNTER — Other Ambulatory Visit: Payer: Self-pay | Admitting: *Deleted

## 2015-01-30 VITALS — BP 121/76 | HR 102 | Temp 97.9°F | Resp 18

## 2015-01-30 DIAGNOSIS — C3491 Malignant neoplasm of unspecified part of right bronchus or lung: Secondary | ICD-10-CM

## 2015-01-30 DIAGNOSIS — C3411 Malignant neoplasm of upper lobe, right bronchus or lung: Secondary | ICD-10-CM

## 2015-01-30 DIAGNOSIS — C3490 Malignant neoplasm of unspecified part of unspecified bronchus or lung: Secondary | ICD-10-CM

## 2015-01-30 DIAGNOSIS — Z5111 Encounter for antineoplastic chemotherapy: Secondary | ICD-10-CM | POA: Diagnosis not present

## 2015-01-30 LAB — COMPREHENSIVE METABOLIC PANEL (CC13)
ALT: 18 U/L (ref 0–55)
ANION GAP: 10 meq/L (ref 3–11)
AST: 17 U/L (ref 5–34)
Albumin: 3.2 g/dL — ABNORMAL LOW (ref 3.5–5.0)
Alkaline Phosphatase: 97 U/L (ref 40–150)
BILIRUBIN TOTAL: 0.33 mg/dL (ref 0.20–1.20)
BUN: 20.2 mg/dL (ref 7.0–26.0)
CALCIUM: 9.3 mg/dL (ref 8.4–10.4)
CHLORIDE: 108 meq/L (ref 98–109)
CO2: 22 mEq/L (ref 22–29)
CREATININE: 1 mg/dL (ref 0.7–1.3)
EGFR: 80 mL/min/{1.73_m2} — ABNORMAL LOW (ref >90)
Glucose: 200 mg/dl — ABNORMAL HIGH (ref 70–140)
Potassium: 4.1 mEq/L (ref 3.5–5.1)
Sodium: 140 mEq/L (ref 136–145)
TOTAL PROTEIN: 6.7 g/dL (ref 6.4–8.3)

## 2015-01-30 LAB — CBC WITH DIFFERENTIAL/PLATELET
BASO%: 1.8 % (ref 0.0–2.0)
Basophils Absolute: 0 10*3/uL (ref 0.0–0.1)
EOS%: 1.2 % (ref 0.0–7.0)
Eosinophils Absolute: 0 10*3/uL (ref 0.0–0.5)
HEMATOCRIT: 30.5 % — AB (ref 38.4–49.9)
HEMOGLOBIN: 10.3 g/dL — AB (ref 13.0–17.1)
LYMPH#: 0.3 10*3/uL — AB (ref 0.9–3.3)
LYMPH%: 18.5 % (ref 14.0–49.0)
MCH: 30.7 pg (ref 27.2–33.4)
MCHC: 33.8 g/dL (ref 32.0–36.0)
MCV: 90.8 fL (ref 79.3–98.0)
MONO#: 0.1 10*3/uL (ref 0.1–0.9)
MONO%: 3 % (ref 0.0–14.0)
NEUT%: 75.5 % — AB (ref 39.0–75.0)
NEUTROS ABS: 1.3 10*3/uL — AB (ref 1.5–6.5)
PLATELETS: 163 10*3/uL (ref 140–400)
RBC: 3.36 10*6/uL — ABNORMAL LOW (ref 4.20–5.82)
RDW: 16.3 % — AB (ref 11.0–14.6)
WBC: 1.7 10*3/uL — ABNORMAL LOW (ref 4.0–10.3)

## 2015-01-30 MED ORDER — SODIUM CHLORIDE 0.9 % IJ SOLN
3.0000 mL | INTRAMUSCULAR | Status: DC | PRN
  Filled 2015-01-30: qty 10

## 2015-01-30 MED ORDER — PROCHLORPERAZINE MALEATE 10 MG PO TABS
10.0000 mg | ORAL_TABLET | Freq: Once | ORAL | Status: AC
  Administered 2015-01-30: 10 mg via ORAL

## 2015-01-30 MED ORDER — SODIUM CHLORIDE 0.9 % IJ SOLN
10.0000 mL | INTRAMUSCULAR | Status: DC | PRN
  Filled 2015-01-30: qty 10

## 2015-01-30 MED ORDER — ALTEPLASE 2 MG IJ SOLR
2.0000 mg | Freq: Once | INTRAMUSCULAR | Status: DC | PRN
  Filled 2015-01-30: qty 2

## 2015-01-30 MED ORDER — HEPARIN SOD (PORK) LOCK FLUSH 100 UNIT/ML IV SOLN
500.0000 [IU] | Freq: Once | INTRAVENOUS | Status: DC | PRN
  Filled 2015-01-30: qty 5

## 2015-01-30 MED ORDER — SODIUM CHLORIDE 0.9 % IV SOLN
Freq: Once | INTRAVENOUS | Status: AC
  Administered 2015-01-30: 15:00:00 via INTRAVENOUS

## 2015-01-30 MED ORDER — PROCHLORPERAZINE MALEATE 10 MG PO TABS
ORAL_TABLET | ORAL | Status: AC
  Filled 2015-01-30: qty 1

## 2015-01-30 MED ORDER — GEMCITABINE HCL CHEMO INJECTION 1 GM/26.3ML
1000.0000 mg/m2 | Freq: Once | INTRAVENOUS | Status: AC
  Administered 2015-01-30: 1520 mg via INTRAVENOUS
  Filled 2015-01-30: qty 39.98

## 2015-01-30 MED ORDER — PEGFILGRASTIM 6 MG/0.6ML ~~LOC~~ PSKT
6.0000 mg | PREFILLED_SYRINGE | Freq: Once | SUBCUTANEOUS | Status: AC
Start: 2015-01-30 — End: 2015-01-30
  Administered 2015-01-30: 6 mg via SUBCUTANEOUS
  Filled 2015-01-30: qty 0.6

## 2015-01-30 MED ORDER — HEPARIN SOD (PORK) LOCK FLUSH 100 UNIT/ML IV SOLN
250.0000 [IU] | Freq: Once | INTRAVENOUS | Status: DC | PRN
  Filled 2015-01-30: qty 5

## 2015-01-30 NOTE — Progress Notes (Signed)
[facility] for treatment today despite WBC of  1.7 and ANC of 1.3. VO Dr. Suanne Marker RN  Will add Neulasta ONPRO to patient orders today. Patient given ON PRO Neulasta video to watch.

## 2015-01-30 NOTE — Progress Notes (Signed)
Pt's niece came in to see if her uncle could get financial assistance with some cancer bills. Reviewed account and did not see a release in file with her name on it. Went with niece to treatment area to introduce myself and have pt sign release form. Asked for bills that they had in question. Pt's niece states that they didn't bring them but can bring them on 10/25 visit. Gave my card and also asked for proof of income on the 10/25 to see if pt qualifies for [place].

## 2015-01-30 NOTE — Patient Instructions (Signed)
[facility] Discharge Instructions for Patients Receiving Chemotherapy Today you received the following chemotherapy agents  Gemzar  To help prevent nausea and vomiting after your treatment, we encourage you to take your nausea medication As directed If you develop nausea and vomiting that is not controlled by your nausea medication, call the clinic.   BELOW ARE SYMPTOMS THAT SHOULD BE REPORTED IMMEDIATELY:  *FEVER GREATER THAN 100.5 F  *CHILLS WITH OR WITHOUT FEVER  NAUSEA AND VOMITING THAT IS NOT CONTROLLED WITH YOUR NAUSEA MEDICATION  *UNUSUAL SHORTNESS OF BREATH  *UNUSUAL BRUISING OR BLEEDING  TENDERNESS IN MOUTH AND THROAT WITH OR WITHOUT PRESENCE OF ULCERS  *URINARY PROBLEMS  *BOWEL PROBLEMS  UNUSUAL RASH Items with * indicate a potential emergency and should be followed up as soon as possible.  Feel free to call the clinic you have any questions or concerns. The clinic phone number is [phone].  Please show the [facility] at check-in to the Emergency Department and triage nurse.

## 2015-02-12 ENCOUNTER — Ambulatory Visit: Payer: Medicaid Other | Admitting: Family Medicine

## 2015-02-12 ENCOUNTER — Ambulatory Visit (HOSPITAL_COMMUNITY)
Admission: RE | Admit: 2015-02-12 | Discharge: 2015-02-12 | Disposition: A | Discharge status: Home / Self Care | Payer: Medicaid Other | Source: Ambulatory Visit | Attending: Internal Medicine | Admitting: Internal Medicine

## 2015-02-12 ENCOUNTER — Encounter: Payer: Self-pay | Admitting: Pharmacist

## 2015-02-12 ENCOUNTER — Encounter (HOSPITAL_COMMUNITY): Payer: Self-pay

## 2015-02-12 DIAGNOSIS — C3491 Malignant neoplasm of unspecified part of right bronchus or lung: Secondary | ICD-10-CM | POA: Insufficient documentation

## 2015-02-12 DIAGNOSIS — Z5111 Encounter for antineoplastic chemotherapy: Secondary | ICD-10-CM

## 2015-02-12 DIAGNOSIS — C787 Secondary malignant neoplasm of liver and intrahepatic bile duct: Secondary | ICD-10-CM | POA: Diagnosis not present

## 2015-02-12 DIAGNOSIS — I2510 Atherosclerotic heart disease of native coronary artery without angina pectoris: Secondary | ICD-10-CM | POA: Insufficient documentation

## 2015-02-12 DIAGNOSIS — R590 Localized enlarged lymph nodes: Secondary | ICD-10-CM | POA: Diagnosis not present

## 2015-02-12 DIAGNOSIS — C7951 Secondary malignant neoplasm of bone: Secondary | ICD-10-CM

## 2015-02-12 DIAGNOSIS — I7090 Unspecified atherosclerosis: Secondary | ICD-10-CM | POA: Insufficient documentation

## 2015-02-12 DIAGNOSIS — Z9221 Personal history of antineoplastic chemotherapy: Secondary | ICD-10-CM | POA: Insufficient documentation

## 2015-02-12 DIAGNOSIS — Z08 Encounter for follow-up examination after completed treatment for malignant neoplasm: Secondary | ICD-10-CM | POA: Insufficient documentation

## 2015-02-12 DIAGNOSIS — J90 Pleural effusion, not elsewhere classified: Secondary | ICD-10-CM | POA: Diagnosis not present

## 2015-02-12 DIAGNOSIS — Z66 Do not resuscitate: Secondary | ICD-10-CM

## 2015-02-12 MED ORDER — IOHEXOL 300 MG/ML  SOLN
75.0000 mL | Freq: Once | INTRAMUSCULAR | Status: AC | PRN
  Administered 2015-02-12: 75 mL via INTRAVENOUS

## 2015-02-13 ENCOUNTER — Other Ambulatory Visit (HOSPITAL_BASED_OUTPATIENT_CLINIC_OR_DEPARTMENT_OTHER): Payer: Medicaid Other

## 2015-02-13 ENCOUNTER — Ambulatory Visit (HOSPITAL_BASED_OUTPATIENT_CLINIC_OR_DEPARTMENT_OTHER): Payer: Medicaid Other | Admitting: Internal Medicine

## 2015-02-13 ENCOUNTER — Ambulatory Visit (HOSPITAL_BASED_OUTPATIENT_CLINIC_OR_DEPARTMENT_OTHER): Payer: Medicaid Other

## 2015-02-13 ENCOUNTER — Telehealth: Payer: Self-pay | Admitting: *Deleted

## 2015-02-13 ENCOUNTER — Encounter: Payer: Self-pay | Admitting: Internal Medicine

## 2015-02-13 ENCOUNTER — Telehealth: Payer: Self-pay | Admitting: Internal Medicine

## 2015-02-13 VITALS — BP 154/95 | HR 120 | Temp 97.8°F | Resp 18 | Ht 65.0 in | Wt 104.5 lb

## 2015-02-13 DIAGNOSIS — Z5111 Encounter for antineoplastic chemotherapy: Secondary | ICD-10-CM

## 2015-02-13 DIAGNOSIS — I10 Essential (primary) hypertension: Secondary | ICD-10-CM

## 2015-02-13 DIAGNOSIS — C7951 Secondary malignant neoplasm of bone: Secondary | ICD-10-CM

## 2015-02-13 DIAGNOSIS — Z66 Do not resuscitate: Secondary | ICD-10-CM

## 2015-02-13 DIAGNOSIS — C3411 Malignant neoplasm of upper lobe, right bronchus or lung: Secondary | ICD-10-CM

## 2015-02-13 DIAGNOSIS — F17200 Nicotine dependence, unspecified, uncomplicated: Secondary | ICD-10-CM

## 2015-02-13 DIAGNOSIS — R634 Abnormal weight loss: Secondary | ICD-10-CM | POA: Diagnosis not present

## 2015-02-13 DIAGNOSIS — C3490 Malignant neoplasm of unspecified part of unspecified bronchus or lung: Secondary | ICD-10-CM

## 2015-02-13 DIAGNOSIS — C3491 Malignant neoplasm of unspecified part of right bronchus or lung: Secondary | ICD-10-CM

## 2015-02-13 DIAGNOSIS — Z801 Family history of malignant neoplasm of trachea, bronchus and lung: Secondary | ICD-10-CM

## 2015-02-13 DIAGNOSIS — R630 Anorexia: Secondary | ICD-10-CM

## 2015-02-13 DIAGNOSIS — R55 Syncope and collapse: Secondary | ICD-10-CM

## 2015-02-13 DIAGNOSIS — R5383 Other fatigue: Secondary | ICD-10-CM | POA: Diagnosis not present

## 2015-02-13 DIAGNOSIS — E785 Hyperlipidemia, unspecified: Secondary | ICD-10-CM

## 2015-02-13 DIAGNOSIS — R911 Solitary pulmonary nodule: Secondary | ICD-10-CM

## 2015-02-13 DIAGNOSIS — I6789 Other cerebrovascular disease: Secondary | ICD-10-CM

## 2015-02-13 DIAGNOSIS — J449 Chronic obstructive pulmonary disease, unspecified: Secondary | ICD-10-CM

## 2015-02-13 LAB — COMPREHENSIVE METABOLIC PANEL (CC13)
ALT: 15 U/L (ref 0–55)
AST: 14 U/L (ref 5–34)
Albumin: 3.3 g/dL — ABNORMAL LOW (ref 3.5–5.0)
Alkaline Phosphatase: 141 U/L (ref 40–150)
Anion Gap: 9 mEq/L (ref 3–11)
BUN: 12.2 mg/dL (ref 7.0–26.0)
CO2: 23 meq/L (ref 22–29)
Calcium: 9.3 mg/dL (ref 8.4–10.4)
Chloride: 105 mEq/L (ref 98–109)
Creatinine: 0.9 mg/dL (ref 0.7–1.3)
GLUCOSE: 199 mg/dL — AB (ref 70–140)
Potassium: 4.3 mEq/L (ref 3.5–5.1)
SODIUM: 137 meq/L (ref 136–145)
Total Bilirubin: 0.31 mg/dL (ref 0.20–1.20)
Total Protein: 6.9 g/dL (ref 6.4–8.3)

## 2015-02-13 LAB — CBC WITH DIFFERENTIAL/PLATELET
BASO%: 0.2 % (ref 0.0–2.0)
BASOS ABS: 0 10*3/uL (ref 0.0–0.1)
EOS ABS: 0 10*3/uL (ref 0.0–0.5)
EOS%: 0 % (ref 0.0–7.0)
HCT: 27.8 % — ABNORMAL LOW (ref 38.4–49.9)
HGB: 9.1 g/dL — ABNORMAL LOW (ref 13.0–17.1)
LYMPH#: 0.4 10*3/uL — AB (ref 0.9–3.3)
LYMPH%: 3 % — AB (ref 14.0–49.0)
MCH: 30.3 pg (ref 27.2–33.4)
MCHC: 32.6 g/dL (ref 32.0–36.0)
MCV: 93.1 fL (ref 79.3–98.0)
MONO#: 0.7 10*3/uL (ref 0.1–0.9)
MONO%: 5.2 % (ref 0.0–14.0)
NEUT%: 91.6 % — ABNORMAL HIGH (ref 39.0–75.0)
NEUTROS ABS: 13 10*3/uL — AB (ref 1.5–6.5)
Platelets: 128 10*3/uL — ABNORMAL LOW (ref 140–400)
RBC: 2.99 10*6/uL — AB (ref 4.20–5.82)
RDW: 18.3 % — ABNORMAL HIGH (ref 11.0–14.6)
WBC: 14.2 10*3/uL — ABNORMAL HIGH (ref 4.0–10.3)

## 2015-02-13 MED ORDER — SODIUM CHLORIDE 0.9 % IV SOLN
Freq: Once | INTRAVENOUS | Status: AC
  Administered 2015-02-13: 13:00:00 via INTRAVENOUS
  Filled 2015-02-13: qty 8

## 2015-02-13 MED ORDER — SODIUM CHLORIDE 0.9 % IV SOLN
Freq: Once | INTRAVENOUS | Status: AC
  Administered 2015-02-13: 12:00:00 via INTRAVENOUS

## 2015-02-13 MED ORDER — CARBOPLATIN CHEMO INTRADERMAL TEST DOSE 100MCG/0.02ML
100.0000 ug | Freq: Once | INTRADERMAL | Status: AC
  Administered 2015-02-13: 100 ug via INTRADERMAL
  Filled 2015-02-13: qty 0.01

## 2015-02-13 MED ORDER — SODIUM CHLORIDE 0.9 % IV SOLN
1000.0000 mg/m2 | Freq: Once | INTRAVENOUS | Status: AC
  Administered 2015-02-13: 1520 mg via INTRAVENOUS
  Filled 2015-02-13: qty 39.98

## 2015-02-13 MED ORDER — SODIUM CHLORIDE 0.9 % IV SOLN
434.5000 mg | Freq: Once | INTRAVENOUS | Status: AC
  Administered 2015-02-13: 430 mg via INTRAVENOUS
  Filled 2015-02-13: qty 43

## 2015-02-13 NOTE — Telephone Encounter (Signed)
Per staff message and POF I have scheduled appts. Advised scheduler of appts. JMW  

## 2015-02-13 NOTE — Progress Notes (Signed)
[place] Telephone:([phone]) [phone]   Fax:(336) [place], [place] [address]  DIAGNOSIS: Metastatic non-small cell lung cancer initially diagnosed as Unresectable a stage IIA (T1a, N1, M0) non-small cell lung cancer, invasive poorly differentiated carcinoma diagnosed in March 2016  PRIOR THERAPY:  1) A course of concurrent chemoradiation with weekly carboplatin for AUC of 2 and paclitaxel 45 MG/M2. 2) concurrent chemoradiation with weekly carboplatin for AUC of 2 and paclitaxel 45 mg/m2 resumed on 10/09/14. Completed his last dose on 10/24/14.  CURRENT THERAPY: Systemic chemotherapy with carboplatin for AUC of 5 on day 1 and gemcitabine 1000 MG/M2 on days 1 and 8 every 3 weeks. First dose on 12/12/2014. Status post 3 cycles.  INTERVAL HISTORY: Adam Warner 59 y.o. male returns to the clinic today for follow-up visit. The patient is feeling fine and tolerating his systemic chemotherapy fairly well except for fatigue and weakness as well as lack of appetite and weight loss. He denied having any significant chest pain, but has shortness breath with exertion with no cough or hemoptysis. He denied having any significant nausea or vomiting, no fever or chills. He had repeat CT scan of the chest, abdomen and pelvis performed recently and he is here for evaluation and discussion of his scan results.  MEDICAL HISTORY: Past Medical History  Diagnosis Date  . COPD (chronic obstructive pulmonary disease) ([place])   . Stroke ([place])   . Hypertension   . Heart attack ([place]) 1991  . Pulmonary nodule   . Coronary artery disease     sees Dr Spero Curb at [facility] every 2-3 years  . Shortness of breath dyspnea   . Full dentures   . Diabetes mellitus     Type Niddm x 2 years  . DNR no code (do not resuscitate) 01/23/2015  . DNR no code (do not resuscitate) 01/23/2015    ALLERGIES:  is allergic to zyban.  MEDICATIONS:  Current  Outpatient Prescriptions  Medication Sig Dispense Refill  . ACCU-CHEK AVIVA PLUS test strip USE TO CHECK ONCE OR TWICE DAILY 100 each 0  . ACCU-CHEK FASTCLIX LANCETS MISC Check blood sugar bid and prn 102 each 0  . aspirin EC 325 MG tablet Take 325 mg by mouth every morning. Hold for surgery    . atorvastatin (LIPITOR) 40 MG tablet TAKE ONE TABLET BY MOUTH ONE TIME DAILY 30 tablet 6  . Blood Glucose Monitoring Suppl (ONE TOUCH ULTRA 2) W/DEVICE KIT Use monitor to test blood sugar qd Dx 250.00 1 each 0  . Cholecalciferol (VITAMIN D) 2000 UNITS CAPS Take 1 capsule by mouth daily.    . feeding supplement, GLUCERNA SHAKE, (GLUCERNA SHAKE) LIQD Take 237 mLs by mouth 2 (two) times daily between meals. 60 Can 5  . metFORMIN (GLUCOPHAGE) 500 MG tablet Take 1 tablet (500 mg total) by mouth 2 (two) times daily with a meal. 60 tablet 2  . oxyCODONE-acetaminophen (PERCOCET/ROXICET) 5-325 MG per tablet Take 1 tablet by mouth every 6 (six) hours as needed for moderate pain or severe pain. 30 tablet 0  . prochlorperazine (COMPAZINE) 10 MG tablet Take 1 tablet (10 mg total) by mouth every 6 (six) hours as needed for nausea or vomiting. 30 tablet 0  . tiotropium (SPIRIVA) 18 MCG inhalation capsule Place 1 capsule (18 mcg total) into inhaler and inhale daily. 30 capsule 6  . valsartan (DIOVAN) 320 MG tablet Take 1 tablet (320 mg  total) by mouth daily. 30 tablet 6   No current facility-administered medications for this visit.    SURGICAL HISTORY:  Past Surgical History  Procedure Laterality Date  . Lung surgery      Dr Arlyce Dice  . Angioplasty    . Colonoscopy  10/22/2011    Procedure: COLONOSCOPY;  Surgeon: Rogene Houston, MD;  Location: [facility];  Service: Endoscopy;  Laterality: N/A;  1200  . Carotid endarterectomy Right   . Video bronchoscopy with endobronchial ultrasound N/A 05/29/2014    Procedure: VIDEO BRONCHOSCOPY WITH ENDOBRONCHIAL ULTRASOUND;  Surgeon: Melrose Nakayama, MD;  Location: [facility];   Service: Thoracic;  Laterality: N/A;    REVIEW OF SYSTEMS:  Constitutional: positive for anorexia, fatigue and weight loss Eyes: negative Ears, nose, mouth, throat, and face: negative Respiratory: positive for dyspnea on exertion Cardiovascular: negative Gastrointestinal: negative Genitourinary:negative Integument/breast: negative Hematologic/lymphatic: negative Musculoskeletal:positive for muscle weakness Neurological: negative Behavioral/Psych: negative Endocrine: negative Allergic/Immunologic: negative   PHYSICAL EXAMINATION: General appearance: alert, cooperative and no distress Head: Normocephalic, without obvious abnormality, atraumatic Neck: no adenopathy, no JVD, supple, symmetrical, trachea midline and thyroid not enlarged, symmetric, no tenderness/mass/nodules Lymph nodes: Cervical, supraclavicular, and axillary nodes normal. Resp: clear to auscultation bilaterally Back: symmetric, no curvature. ROM normal. No CVA tenderness. Cardio: regular rate and rhythm, S1, S2 normal, no murmur, click, rub or gallop GI: soft, non-tender; bowel sounds normal; no masses,  no organomegaly Extremities: extremities normal, atraumatic, no cyanosis or edema Neurologic: Alert and oriented X 3, normal strength and tone. Normal symmetric reflexes. Normal coordination and gait  ECOG PERFORMANCE STATUS: 1 - Symptomatic but completely ambulatory  Blood pressure 154/95, pulse 120, temperature 97.8 F (36.6 C), temperature source Oral, resp. rate 18, height _0  (1.651 m), weight 104 lb 8 oz (47.401 kg), SpO2 100 %.  LABORATORY DATA: Lab Results  Component Value Date   WBC 14.2* 02/13/2015   HGB 9.1* 02/13/2015   HCT 27.8* 02/13/2015   MCV 93.1 02/13/2015   PLT 128* 02/13/2015      Chemistry      Component Value Date/Time   NA 140 01/30/2015 1316   NA 136 05/23/2014 1015   NA 139 04/12/2014 1029   K 4.1 01/30/2015 1316   K 4.1 05/23/2014 1015   CL 106 05/23/2014 1015   CO2 22  01/30/2015 1316   CO2 25 05/23/2014 1015   BUN 20.2 01/30/2015 1316   BUN 10 05/23/2014 1015   BUN 12 04/12/2014 1029   CREATININE 1.0 01/30/2015 1316   CREATININE 1.07 05/23/2014 1015   CREATININE 1.05 11/04/2012 1242      Component Value Date/Time   CALCIUM 9.3 01/30/2015 1316   CALCIUM 9.3 05/23/2014 1015   ALKPHOS 97 01/30/2015 1316   ALKPHOS 90 05/23/2014 1015   AST 17 01/30/2015 1316   AST 19 05/23/2014 1015   ALT 18 01/30/2015 1316   ALT 17 05/23/2014 1015   BILITOT 0.33 01/30/2015 1316   BILITOT 0.6 05/23/2014 1015       RADIOGRAPHIC STUDIES: Ct Chest W Contrast  02/12/2015  CLINICAL DATA:  Non-small cell poorly differentiated right upper lobe lung cancer diagnosed in February 2016, with bone and liver metastases identified in August 2016. Status post radiation therapy with ongoing chemotherapy. EXAM: CT CHEST WITH CONTRAST TECHNIQUE: Multidetector CT imaging of the chest was performed during intravenous contrast administration. CONTRAST:  90m OMNIPAQUE IOHEXOL 300 MG/ML  SOLN COMPARISON:  11/27/2014 chest CT. FINDINGS: Mediastinum/Nodes: Normal heart size. Mild pericardial  fluid/thickening, slightly increased. There is atherosclerosis of the thoracic aorta, the great vessels of the mediastinum and the coronary arteries, including calcified atherosclerotic plaque in the left anterior descending and right coronary arteries. Great vessels are normal in course and caliber. No central pulmonary emboli. Normal visualized thyroid. Normal esophagus. No axillary lymphadenopathy. A top-normal size 0.7 cm upper right paratracheal node (series 2/ image 12) is increased from 0.5 cm. A 0.7 cm prevascular left anterior mediastinal node (2/22) is increased from 0.3 cm. Stable moderately enlarged 2.0 cm right hilar node (2/29). Otherwise no pathologically enlarged mediastinal or hilar lymph nodes. Lungs/Pleura: No pneumothorax. Moderate layering right pleural effusion, increased. No left pleural  effusion. There are stable postsurgical changes from left upper lobe wedge resection. The spiculated peripheral subpleural right upper lobe 0.8 cm right upper lobe pulmonary nodule described on the 11/27/2014 chest CT appears decreased to 0.6 cm (series 5/ image 27). There is increased subpleural consolidation in the anterior right upper lobe with sharp posterior margin, in keeping with evolving postradiation change. Spiculated apical right upper lobe 0.5 cm pulmonary nodule (5/18) is unchanged since 03/10/2011 and benign. Spiculated peripheral subpleural right upper lobe 0.7 cm pulmonary nodule (5/24) is unchanged since 03/10/2011 and benign. These findings are superimposed on underlying peripheral basilar predominant interstitial lung disease characterized by patchy areas of subpleural reticulation, traction bronchiectasis and areas of frank honeycombing (such as in the right lower lobe on 5/34), with continued slow progression (with clear progression compared to the 03/10/2011 chest CT). No new significant pulmonary nodules or lung masses. Upper abdomen: Segment 2 left liver lobe hypodense 1.0 cm mass (2/55) is decreased from 2.5 cm. There are greater than 10 additional subcentimeter scattered hypodense lesions throughout the visualized liver, too small to characterize, not appreciably changed. Musculoskeletal: Stable lytic destructive osseous metastasis in the right T11 vertebral body with associated moderate pathologic right T11 vertebral body fracture with approximately 50% loss of right vertebral body height, slightly worsened. No new aggressive appearing focal osseous lesions in the chest. IMPRESSION: 1. Partial interval treatment response in the subcentimeter spiculated peripheral right upper lobe pulmonary nodule. Evolving postradiation change in the right upper lobe. 2. Stable right hilar lymphadenopathy. 3. Slight growth of subcentimeter right upper paratracheal and prevascular mediastinal lymph nodes,  suspicious for enlarging nodal metastases. 4. Moderate layering right pleural effusion and mild pericardial fluid/thickening, both increased. 5. Partial interval treatment response in the left liver lobe metastasis. 6. Stable lytic right T11 vertebral body osseous metastasis with slightly worsened loss of T11 vertebral body height. No new thoracic osseous metastases. 7. Underlying peripheral basilar predominant interstitial lung disease characterized by subpleural reticulation, traction bronchiectasis and areas of frank honeycombing, which continues to slowly progress since 2012, suggesting usual interstitial pneumonia (UIP). 8. Atherosclerosis, including two-vessel coronary artery disease. Please note that although the presence of coronary artery calcium documents the presence of coronary artery disease, the severity of this disease and any potential stenosis cannot be assessed on this non-gated CT examination. Electronically Signed   By: Ilona Sorrel M.D.   On: 02/12/2015 14:05    ASSESSMENT AND PLAN: This is a very pleasant 59years old white male with history of stage IIA non-small cell lung cancer status post a course of concurrent chemoradiation with weekly carboplatin and paclitaxel. The recent CT scan of the chest showed interval decrease in the size of the left hilar mass and adenopathy. He is currently undergoing systemic chemotherapy with carboplatin and gemcitabine status post 3 ycles He tolerated the  treatment well except for fatigue as well as lack of appetite and weight loss. His recent CT scan of the chest, abdomen and pelvis showed improvement of his disease. I discussed the scan results with the patient and his family. I recommended for him to continue his current treatment with carboplatin and gemcitabine as a scheduled. The patient will receive cycle #4 today. He would come back for follow-up visit in 3 weeks for reevaluation before starting cycle #5. He would continue on Percocet for the  back pain. CODE STATUS: no CODE BLUE He was advised to call immediately if he has any concerning symptoms in the interval. The patient was encouraged to quit smoking and we offered him to smoke cessation program. The patient voices understanding of current disease status and treatment options and is in agreement with the current care plan.  All questions were answered. The patient knows to call the clinic with any problems, questions or concerns. We can certainly see the patient much sooner if necessary.  Disclaimer: This note was dictated with voice recognition software. Similar sounding words can inadvertently be transcribed and may not be corrected upon review.

## 2015-02-13 NOTE — Progress Notes (Signed)
Pt's niece brought proof of income. Approved pt for [facility] $400 grant. Pt signed letter and copy given as well as expenses covered.

## 2015-02-13 NOTE — Patient Instructions (Signed)
[facility] Discharge Instructions for Patients Receiving Chemotherapy  Today you received the following chemotherapy agents:  Carboplatin, Gemzar  To help prevent nausea and vomiting after your treatment, we encourage you to take your nausea medication as prescribed.   If you develop nausea and vomiting that is not controlled by your nausea medication, call the clinic.   BELOW ARE SYMPTOMS THAT SHOULD BE REPORTED IMMEDIATELY:  *FEVER GREATER THAN 100.5 F  *CHILLS WITH OR WITHOUT FEVER  NAUSEA AND VOMITING THAT IS NOT CONTROLLED WITH YOUR NAUSEA MEDICATION  *UNUSUAL SHORTNESS OF BREATH  *UNUSUAL BRUISING OR BLEEDING  TENDERNESS IN MOUTH AND THROAT WITH OR WITHOUT PRESENCE OF ULCERS  *URINARY PROBLEMS  *BOWEL PROBLEMS  UNUSUAL RASH Items with * indicate a potential emergency and should be followed up as soon as possible.  Feel free to call the clinic you have any questions or concerns. The clinic phone number is [phone].  Please show the CHEMO ALERT CARD at check-in to the Emergency Department and triage nurse.   

## 2015-02-13 NOTE — Telephone Encounter (Signed)
per pof to sch pt appt*gave pt copy of avs-sent [facility] emai to sch pt trmt-pt aware

## 2015-02-20 ENCOUNTER — Encounter: Payer: Self-pay | Admitting: Internal Medicine

## 2015-02-20 ENCOUNTER — Telehealth: Payer: Self-pay | Admitting: Internal Medicine

## 2015-02-20 ENCOUNTER — Other Ambulatory Visit: Payer: Self-pay | Admitting: Medical Oncology

## 2015-02-20 ENCOUNTER — Ambulatory Visit: Payer: Medicaid Other | Admitting: Nutrition

## 2015-02-20 ENCOUNTER — Ambulatory Visit (HOSPITAL_BASED_OUTPATIENT_CLINIC_OR_DEPARTMENT_OTHER): Payer: Medicaid Other

## 2015-02-20 ENCOUNTER — Ambulatory Visit (HOSPITAL_COMMUNITY)
Admission: RE | Admit: 2015-02-20 | Discharge: 2015-02-20 | Disposition: A | Discharge status: Home / Self Care | Payer: Medicaid Other | Source: Ambulatory Visit | Attending: Internal Medicine | Admitting: Internal Medicine

## 2015-02-20 ENCOUNTER — Other Ambulatory Visit (HOSPITAL_BASED_OUTPATIENT_CLINIC_OR_DEPARTMENT_OTHER): Payer: Medicaid Other

## 2015-02-20 ENCOUNTER — Telehealth: Payer: Self-pay | Admitting: Medical Oncology

## 2015-02-20 VITALS — BP 101/66 | HR 100 | Temp 97.9°F | Resp 18

## 2015-02-20 DIAGNOSIS — R55 Syncope and collapse: Secondary | ICD-10-CM

## 2015-02-20 DIAGNOSIS — T451X5A Adverse effect of antineoplastic and immunosuppressive drugs, initial encounter: Secondary | ICD-10-CM

## 2015-02-20 DIAGNOSIS — C7951 Secondary malignant neoplasm of bone: Secondary | ICD-10-CM

## 2015-02-20 DIAGNOSIS — F17200 Nicotine dependence, unspecified, uncomplicated: Secondary | ICD-10-CM

## 2015-02-20 DIAGNOSIS — D6481 Anemia due to antineoplastic chemotherapy: Secondary | ICD-10-CM

## 2015-02-20 DIAGNOSIS — Z801 Family history of malignant neoplasm of trachea, bronchus and lung: Secondary | ICD-10-CM

## 2015-02-20 DIAGNOSIS — C3411 Malignant neoplasm of upper lobe, right bronchus or lung: Secondary | ICD-10-CM | POA: Diagnosis not present

## 2015-02-20 DIAGNOSIS — C3491 Malignant neoplasm of unspecified part of right bronchus or lung: Secondary | ICD-10-CM

## 2015-02-20 DIAGNOSIS — Z5111 Encounter for antineoplastic chemotherapy: Secondary | ICD-10-CM

## 2015-02-20 DIAGNOSIS — R911 Solitary pulmonary nodule: Secondary | ICD-10-CM

## 2015-02-20 DIAGNOSIS — C3490 Malignant neoplasm of unspecified part of unspecified bronchus or lung: Secondary | ICD-10-CM

## 2015-02-20 DIAGNOSIS — I10 Essential (primary) hypertension: Secondary | ICD-10-CM

## 2015-02-20 DIAGNOSIS — E785 Hyperlipidemia, unspecified: Secondary | ICD-10-CM

## 2015-02-20 DIAGNOSIS — I6789 Other cerebrovascular disease: Secondary | ICD-10-CM

## 2015-02-20 DIAGNOSIS — J449 Chronic obstructive pulmonary disease, unspecified: Secondary | ICD-10-CM

## 2015-02-20 DIAGNOSIS — Z66 Do not resuscitate: Secondary | ICD-10-CM

## 2015-02-20 LAB — COMPREHENSIVE METABOLIC PANEL (CC13)
ALBUMIN: 3.1 g/dL — AB (ref 3.5–5.0)
ALK PHOS: 122 U/L (ref 40–150)
ALT: 20 U/L (ref 0–55)
ANION GAP: 8 meq/L (ref 3–11)
AST: 16 U/L (ref 5–34)
BUN: 15.6 mg/dL (ref 7.0–26.0)
CALCIUM: 9 mg/dL (ref 8.4–10.4)
CO2: 23 meq/L (ref 22–29)
CREATININE: 1 mg/dL (ref 0.7–1.3)
Chloride: 107 mEq/L (ref 98–109)
EGFR: 82 mL/min/{1.73_m2} — AB (ref >90)
Glucose: 230 mg/dl — ABNORMAL HIGH (ref 70–140)
Potassium: 4.3 mEq/L (ref 3.5–5.1)
Sodium: 137 mEq/L (ref 136–145)
TOTAL PROTEIN: 6.6 g/dL (ref 6.4–8.3)

## 2015-02-20 LAB — CBC WITH DIFFERENTIAL/PLATELET
BASO%: 0.5 % (ref 0.0–2.0)
BASOS ABS: 0 10*3/uL (ref 0.0–0.1)
EOS%: 0.3 % (ref 0.0–7.0)
Eosinophils Absolute: 0 10*3/uL (ref 0.0–0.5)
HEMATOCRIT: 23.5 % — AB (ref 38.4–49.9)
HGB: 7.8 g/dL — ABNORMAL LOW (ref 13.0–17.1)
LYMPH%: 4.9 % — ABNORMAL LOW (ref 14.0–49.0)
MCH: 31 pg (ref 27.2–33.4)
MCHC: 33.3 g/dL (ref 32.0–36.0)
MCV: 93 fL (ref 79.3–98.0)
MONO#: 0.1 10*3/uL (ref 0.1–0.9)
MONO%: 2.5 % (ref 0.0–14.0)
NEUT%: 91.8 % — AB (ref 39.0–75.0)
NEUTROS ABS: 4.7 10*3/uL (ref 1.5–6.5)
PLATELETS: 149 10*3/uL (ref 140–400)
RBC: 2.53 10*6/uL — AB (ref 4.20–5.82)
RDW: 20.1 % — ABNORMAL HIGH (ref 11.0–14.6)
WBC: 5.1 10*3/uL (ref 4.0–10.3)
lymph#: 0.3 10*3/uL — ABNORMAL LOW (ref 0.9–3.3)

## 2015-02-20 LAB — HOLD TUBE, BLOOD BANK

## 2015-02-20 MED ORDER — PROCHLORPERAZINE MALEATE 10 MG PO TABS
10.0000 mg | ORAL_TABLET | Freq: Once | ORAL | Status: AC
  Administered 2015-02-20: 10 mg via ORAL

## 2015-02-20 MED ORDER — SODIUM CHLORIDE 0.9 % IV SOLN
1000.0000 mg/m2 | Freq: Once | INTRAVENOUS | Status: AC
  Administered 2015-02-20: 1520 mg via INTRAVENOUS
  Filled 2015-02-20: qty 39.98

## 2015-02-20 MED ORDER — SODIUM CHLORIDE 0.9 % IV SOLN
Freq: Once | INTRAVENOUS | Status: AC
  Administered 2015-02-20: 14:00:00 via INTRAVENOUS

## 2015-02-20 MED ORDER — PROCHLORPERAZINE MALEATE 10 MG PO TABS
ORAL_TABLET | ORAL | Status: AC
  Filled 2015-02-20: qty 1

## 2015-02-20 NOTE — Telephone Encounter (Signed)
Added blood for 11/4. Spoke with patient dtr re appointment. Other appointments remain the same.

## 2015-02-20 NOTE — Progress Notes (Signed)
Nutrition follow-up completed with patient during chemotherapy for non-small cell lung cancer. Weight improved documented as 104.5 pounds October 25 increased from 103.6 pounds September 13. Appetite remains poor. Patient continues to drink Glucerna. Patient asking today how to improve weight.  Nutrition diagnosis: Unintended weight loss improved.  Intervention:  Educated patient again on strategies for increasing calories and protein. Provided a fact sheet Recommended patient consume Glucerna 2-3 times a day and provided samples and coupons. Teach back method used.  Monitoring, evaluation, goals: Patient will work to increase calories and protein to promote weight gain.  Next visit: Tuesday, November 22, during infusion.  **Disclaimer: This note was dictated with voice recognition software. Similar sounding words can inadvertently be transcribed and this note may contain transcription errors which may not have been corrected upon publication of note.**

## 2015-02-20 NOTE — Progress Notes (Signed)
Quick Note:  Call patient with the result and arrange for 2 units of PRBCs in the next few days. ______

## 2015-02-20 NOTE — Progress Notes (Signed)
OK to tx per Dr. Earlie Server.

## 2015-02-20 NOTE — Progress Notes (Signed)
har done 

## 2015-02-20 NOTE — Telephone Encounter (Signed)
-----   Message from Curt Bears, MD sent at 02/20/2015  1:25 PM EDT ----- Call patient with the result and arrange for 2 units of PRBCs in the next few days.

## 2015-02-20 NOTE — Patient Instructions (Signed)
[place] [facility] Discharge Instructions for Patients Receiving Chemotherapy  Today you received the following chemotherapy agents: gemzar   To help prevent nausea and vomiting after your treatment, we encourage you to take your nausea medications as directed.    If you develop nausea and vomiting that is not controlled by your nausea medication, call the clinic.   BELOW ARE SYMPTOMS THAT SHOULD BE REPORTED IMMEDIATELY:  *FEVER GREATER THAN 100.5 F  *CHILLS WITH OR WITHOUT FEVER  NAUSEA AND VOMITING THAT IS NOT CONTROLLED WITH YOUR NAUSEA MEDICATION  *UNUSUAL SHORTNESS OF BREATH  *UNUSUAL BRUISING OR BLEEDING  TENDERNESS IN MOUTH AND THROAT WITH OR WITHOUT PRESENCE OF ULCERS  *URINARY PROBLEMS  *BOWEL PROBLEMS  UNUSUAL RASH Items with * indicate a potential emergency and should be followed up as soon as possible.  Feel free to call the clinic you have any questions or concerns. The clinic phone number is [phone].  Please show the [facility] at check-in to the Emergency Department and triage nurse.

## 2015-02-21 ENCOUNTER — Encounter: Payer: Self-pay | Admitting: Internal Medicine

## 2015-02-21 NOTE — Progress Notes (Signed)
Pt' niece brought [employer] application. Faxed to [employer].

## 2015-02-22 ENCOUNTER — Encounter: Payer: Self-pay | Admitting: Family Medicine

## 2015-02-23 ENCOUNTER — Ambulatory Visit (HOSPITAL_BASED_OUTPATIENT_CLINIC_OR_DEPARTMENT_OTHER): Payer: Medicaid Other

## 2015-02-23 VITALS — BP 115/76 | HR 94 | Temp 97.3°F | Resp 20

## 2015-02-23 DIAGNOSIS — T451X5A Adverse effect of antineoplastic and immunosuppressive drugs, initial encounter: Principal | ICD-10-CM

## 2015-02-23 DIAGNOSIS — D6481 Anemia due to antineoplastic chemotherapy: Secondary | ICD-10-CM

## 2015-02-23 LAB — PREPARE RBC (CROSSMATCH)

## 2015-02-23 MED ORDER — ACETAMINOPHEN 325 MG PO TABS
650.0000 mg | ORAL_TABLET | Freq: Once | ORAL | Status: AC
  Administered 2015-02-23: 650 mg via ORAL

## 2015-02-23 MED ORDER — ACETAMINOPHEN 325 MG PO TABS
ORAL_TABLET | ORAL | Status: AC
  Filled 2015-02-23: qty 2

## 2015-02-23 MED ORDER — DIPHENHYDRAMINE HCL 25 MG PO CAPS
ORAL_CAPSULE | ORAL | Status: AC
  Filled 2015-02-23: qty 1

## 2015-02-23 MED ORDER — SODIUM CHLORIDE 0.9 % IV SOLN
250.0000 mL | Freq: Once | INTRAVENOUS | Status: AC
  Administered 2015-02-23: 250 mL via INTRAVENOUS

## 2015-02-23 MED ORDER — DIPHENHYDRAMINE HCL 25 MG PO CAPS
25.0000 mg | ORAL_CAPSULE | Freq: Once | ORAL | Status: AC
  Administered 2015-02-23: 25 mg via ORAL

## 2015-02-23 NOTE — Patient Instructions (Signed)
Blood Transfusion, Care After °Refer to this sheet in the next few weeks. These instructions provide you with information about caring for yourself after your procedure. Your health care provider may also give you more specific instructions. Your treatment has been planned according to current medical practices, but problems sometimes occur. Call your health care provider if you have any problems or questions after your procedure. °WHAT TO EXPECT AFTER THE PROCEDURE °After your procedure, it is common to have: °· Bruising and soreness at the IV site. °· Chills or fever. °· Headache. °HOME CARE INSTRUCTIONS °· Take medicines only as directed by your health care provider. Ask your health care provider if you can take an over-the-counter pain reliever in case you have a fever or headache a day or two after your transfusion. °· Return to your normal activities as directed by your health care provider. °SEEK MEDICAL CARE IF:  °· You develop redness or irritation at your IV site. °· You have persistent fever, chills, or headache. °· Your urine is darker than normal. °· Your urine turns pink, red, or brown.   °· The white part of your eye turns yellow (jaundice).   °· You feel weak after doing your normal activities.   °SEEK IMMEDIATE MEDICAL CARE IF:  °· You have trouble breathing. °· You have fever and chills along with: °¨ Anxiety. °¨ Chest or back pain. °¨ Flushed skin. °¨ Clammy skin. °¨ A rapid heartbeat. °¨ Nausea. °  °This information is not intended to replace advice given to you by your health care provider. Make sure you discuss any questions you have with your health care provider. °  °Document Released: 04/28/2014 Document Reviewed: 04/28/2014 °Elsevier Interactive Patient Education ©2016 [employer] ° °

## 2015-02-25 LAB — TYPE AND SCREEN
ABO/RH(D): O POS
ANTIBODY SCREEN: NEGATIVE
UNIT DIVISION: 0
Unit division: 0

## 2015-03-06 ENCOUNTER — Telehealth: Payer: Self-pay | Admitting: Internal Medicine

## 2015-03-06 ENCOUNTER — Ambulatory Visit (HOSPITAL_BASED_OUTPATIENT_CLINIC_OR_DEPARTMENT_OTHER): Payer: Medicaid Other

## 2015-03-06 ENCOUNTER — Other Ambulatory Visit (HOSPITAL_BASED_OUTPATIENT_CLINIC_OR_DEPARTMENT_OTHER): Payer: Medicaid Other

## 2015-03-06 ENCOUNTER — Ambulatory Visit (HOSPITAL_BASED_OUTPATIENT_CLINIC_OR_DEPARTMENT_OTHER): Payer: Medicaid Other | Admitting: Physician Assistant

## 2015-03-06 VITALS — BP 131/68 | HR 109 | Temp 98.0°F | Resp 18 | Ht 65.0 in | Wt 105.5 lb

## 2015-03-06 DIAGNOSIS — C3411 Malignant neoplasm of upper lobe, right bronchus or lung: Secondary | ICD-10-CM

## 2015-03-06 DIAGNOSIS — Z5111 Encounter for antineoplastic chemotherapy: Secondary | ICD-10-CM

## 2015-03-06 DIAGNOSIS — C3491 Malignant neoplasm of unspecified part of right bronchus or lung: Secondary | ICD-10-CM

## 2015-03-06 DIAGNOSIS — R634 Abnormal weight loss: Secondary | ICD-10-CM

## 2015-03-06 DIAGNOSIS — R630 Anorexia: Secondary | ICD-10-CM | POA: Diagnosis not present

## 2015-03-06 DIAGNOSIS — R5383 Other fatigue: Secondary | ICD-10-CM

## 2015-03-06 DIAGNOSIS — D6481 Anemia due to antineoplastic chemotherapy: Secondary | ICD-10-CM | POA: Diagnosis not present

## 2015-03-06 DIAGNOSIS — T451X5A Adverse effect of antineoplastic and immunosuppressive drugs, initial encounter: Principal | ICD-10-CM

## 2015-03-06 DIAGNOSIS — C3490 Malignant neoplasm of unspecified part of unspecified bronchus or lung: Secondary | ICD-10-CM

## 2015-03-06 DIAGNOSIS — D630 Anemia in neoplastic disease: Secondary | ICD-10-CM

## 2015-03-06 LAB — CBC WITH DIFFERENTIAL/PLATELET
BASO%: 0.1 % (ref 0.0–2.0)
Basophils Absolute: 0 10*3/uL (ref 0.0–0.1)
EOS%: 0.8 % (ref 0.0–7.0)
Eosinophils Absolute: 0 10*3/uL (ref 0.0–0.5)
HEMATOCRIT: 34.3 % — AB (ref 38.4–49.9)
HEMOGLOBIN: 11.2 g/dL — AB (ref 13.0–17.1)
LYMPH#: 0.3 10*3/uL — AB (ref 0.9–3.3)
LYMPH%: 6.2 % — ABNORMAL LOW (ref 14.0–49.0)
MCH: 30.2 pg (ref 27.2–33.4)
MCHC: 32.8 g/dL (ref 32.0–36.0)
MCV: 92.2 fL (ref 79.3–98.0)
MONO#: 0.8 10*3/uL (ref 0.1–0.9)
MONO%: 18.4 % — ABNORMAL HIGH (ref 0.0–14.0)
NEUT%: 74.5 % (ref 39.0–75.0)
NEUTROS ABS: 3.2 10*3/uL (ref 1.5–6.5)
Platelets: 137 10*3/uL — ABNORMAL LOW (ref 140–400)
RBC: 3.71 10*6/uL — ABNORMAL LOW (ref 4.20–5.82)
RDW: 19.3 % — AB (ref 11.0–14.6)
WBC: 4.3 10*3/uL (ref 4.0–10.3)

## 2015-03-06 LAB — COMPREHENSIVE METABOLIC PANEL (CC13)
ALT: 18 U/L (ref 0–55)
AST: 16 U/L (ref 5–34)
Albumin: 3.4 g/dL — ABNORMAL LOW (ref 3.5–5.0)
Alkaline Phosphatase: 105 U/L (ref 40–150)
Anion Gap: 11 mEq/L (ref 3–11)
BILIRUBIN TOTAL: 0.46 mg/dL (ref 0.20–1.20)
BUN: 12.7 mg/dL (ref 7.0–26.0)
CALCIUM: 9.4 mg/dL (ref 8.4–10.4)
CHLORIDE: 106 meq/L (ref 98–109)
CO2: 21 meq/L — AB (ref 22–29)
CREATININE: 1 mg/dL (ref 0.7–1.3)
EGFR: 79 mL/min/{1.73_m2} — ABNORMAL LOW (ref >90)
Glucose: 118 mg/dl (ref 70–140)
Potassium: 4 mEq/L (ref 3.5–5.1)
Sodium: 137 mEq/L (ref 136–145)
TOTAL PROTEIN: 6.9 g/dL (ref 6.4–8.3)

## 2015-03-06 LAB — HOLD TUBE, BLOOD BANK

## 2015-03-06 MED ORDER — SODIUM CHLORIDE 0.9 % IV SOLN
1000.0000 mg/m2 | Freq: Once | INTRAVENOUS | Status: AC
  Administered 2015-03-06: 1520 mg via INTRAVENOUS
  Filled 2015-03-06: qty 39.98

## 2015-03-06 MED ORDER — SODIUM CHLORIDE 0.9 % IV SOLN
Freq: Once | INTRAVENOUS | Status: AC
  Administered 2015-03-06: 10:00:00 via INTRAVENOUS

## 2015-03-06 MED ORDER — CARBOPLATIN CHEMO INTRADERMAL TEST DOSE 100MCG/0.02ML
100.0000 ug | Freq: Once | INTRADERMAL | Status: AC
  Administered 2015-03-06: 100 ug via INTRADERMAL
  Filled 2015-03-06: qty 0.01

## 2015-03-06 MED ORDER — SODIUM CHLORIDE 0.9 % IV SOLN
403.5000 mg | Freq: Once | INTRAVENOUS | Status: AC
  Administered 2015-03-06: 400 mg via INTRAVENOUS
  Filled 2015-03-06: qty 40

## 2015-03-06 MED ORDER — DEXAMETHASONE SODIUM PHOSPHATE 100 MG/10ML IJ SOLN
Freq: Once | INTRAMUSCULAR | Status: AC
  Administered 2015-03-06: 11:00:00 via INTRAVENOUS
  Filled 2015-03-06: qty 8

## 2015-03-06 NOTE — Telephone Encounter (Signed)
Gave and printed appt sched and avs for pt for NOV and DEc

## 2015-03-06 NOTE — Progress Notes (Signed)
[facility] [phone]   Fax:([phone]) [place], [place] [address]  DIAGNOSIS: Metastatic non-small cell lung cancer initially diagnosed as Unresectable a stage IIA (T1a, N1, M0) non-small cell lung cancer, invasive poorly differentiated carcinoma diagnosed in March 2016  PRIOR THERAPY:  1) A course of concurrent chemoradiation with weekly carboplatin for AUC of 2 and paclitaxel 45 MG/M2. 2) concurrent chemoradiation with weekly carboplatin for AUC of 2 and paclitaxel 45 mg/m2 resumed on 10/09/14. Completed his last dose on 10/24/14.  CURRENT THERAPY: Systemic chemotherapy with carboplatin for AUC of 5 on day 1 and gemcitabine 1000 MG/M2 on days 1 and 8 every 3 weeks. First dose on 12/12/2014. Status post 4 cycles.  INTERVAL HISTORY: Adam Warner 59 y.o. male returns to the clinic today for follow-up visit. The patient is feeling better since his blood transfusion on 11/1and on 11/7.  He has been tolerating his systemic chemotherapy fairly well except for fatigue and weakness as well as lack of appetite and weight loss. He has been seen by nutritionist on 11/1, and is following their instructions to increase Glucerna intake. He denied having any significant chest pain, but has shortness breath with exertion with no cough or hemoptysis. He denied having any significant nausea or vomiting, no fever or chills. He is here for cycle #5 of Carboplatin and Gemzar.  MEDICAL HISTORY: Past Medical History  Diagnosis Date  . COPD (chronic obstructive pulmonary disease) ([facility])   . Stroke ([facility])   . Hypertension   . Heart attack ([facility]) 1991  . Pulmonary nodule   . Coronary artery disease     sees Dr Spero Curb at [facility] every 2-3 years  . Shortness of breath dyspnea   . Full dentures   . Diabetes mellitus     Type Niddm x 2 years  . DNR no code (do not resuscitate) 01/23/2015  . DNR no code (do not resuscitate)  01/23/2015    ALLERGIES:  is allergic to zyban.  MEDICATIONS:  Current Outpatient Prescriptions  Medication Sig Dispense Refill  . ACCU-CHEK AVIVA PLUS test strip USE TO CHECK ONCE OR TWICE DAILY 100 each 0  . ACCU-CHEK FASTCLIX LANCETS MISC Check blood sugar bid and prn 102 each 0  . aspirin EC 325 MG tablet Take 325 mg by mouth every morning. Hold for surgery    . atorvastatin (LIPITOR) 40 MG tablet TAKE ONE TABLET BY MOUTH ONE TIME DAILY 30 tablet 6  . Blood Glucose Monitoring Suppl (ONE TOUCH ULTRA 2) W/DEVICE KIT Use monitor to test blood sugar qd Dx 250.00 1 each 0  . Cholecalciferol (VITAMIN D) 2000 UNITS CAPS Take 1 capsule by mouth daily.    . feeding supplement, GLUCERNA SHAKE, (GLUCERNA SHAKE) LIQD Take 237 mLs by mouth 2 (two) times daily between meals. 60 Can 5  . metFORMIN (GLUCOPHAGE) 500 MG tablet Take 1 tablet (500 mg total) by mouth 2 (two) times daily with a meal. 60 tablet 2  . oxyCODONE-acetaminophen (PERCOCET/ROXICET) 5-325 MG per tablet Take 1 tablet by mouth every 6 (six) hours as needed for moderate pain or severe pain. 30 tablet 0  . prochlorperazine (COMPAZINE) 10 MG tablet Take 1 tablet (10 mg total) by mouth every 6 (six) hours as needed for nausea or vomiting. 30 tablet 0  . tiotropium (SPIRIVA) 18 MCG inhalation capsule Place 1 capsule (18 mcg total) into inhaler and inhale daily. 30 capsule  6  . valsartan (DIOVAN) 320 MG tablet Take 1 tablet (320 mg total) by mouth daily. 30 tablet 6   No current facility-administered medications for this visit.    SURGICAL HISTORY:  Past Surgical History  Procedure Laterality Date  . Lung surgery      Dr Arlyce Dice  . Angioplasty    . Colonoscopy  10/22/2011    Procedure: COLONOSCOPY;  Surgeon: Rogene Houston, MD;  Location: [facility];  Service: Endoscopy;  Laterality: N/A;  1200  . Carotid endarterectomy Right   . Video bronchoscopy with endobronchial ultrasound N/A 05/29/2014    Procedure: VIDEO BRONCHOSCOPY WITH  ENDOBRONCHIAL ULTRASOUND;  Surgeon: Melrose Nakayama, MD;  Location: [facility];  Service: Thoracic;  Laterality: N/A;    REVIEW OF SYSTEMS:  Constitutional: positive for anorexia, fatigue and weight loss Eyes: negative Ears, nose, mouth, throat, and face: negative Respiratory: positive for dyspnea on exertion Cardiovascular: negative Gastrointestinal: negative Genitourinary:negative Integument/breast: negative Hematologic/lymphatic: negative Musculoskeletal:positive for muscle weakness Neurological: negative Behavioral/Psych: negative Endocrine: negative Allergic/Immunologic: negative   PHYSICAL EXAMINATION: General appearance: alert, cooperative and no distress Head: Normocephalic, without obvious abnormality, atraumatic Neck: no adenopathy, no JVD, supple, symmetrical, trachea midline and thyroid not enlarged, symmetric, no tenderness/mass/nodules Lymph nodes: Cervical, supraclavicular, and axillary nodes normal. Resp: clear to auscultation bilaterally Back: symmetric, no curvature. ROM normal. No CVA tenderness. Cardio: regular rate and rhythm, S1, S2 normal, no murmur, click, rub or gallop GI: soft, non-tender; bowel sounds normal; no masses,  no organomegaly Extremities: extremities normal, atraumatic, no cyanosis or edema Neurologic: Alert and oriented X 3, normal strength and tone. Normal symmetric reflexes. Normal coordination and gait  ECOG PERFORMANCE STATUS: 1 - Symptomatic but completely ambulatory  There were no vitals taken for this visit.  LABORATORY DATA: CBC Latest Ref Rng 03/06/2015 02/20/2015 02/13/2015  WBC 4.0 - 10.3 10e3/uL 4.3 5.1 14.2(H)  Hemoglobin 13.0 - 17.1 g/dL 11.2(L) 7.8(L) 9.1(L)  Hematocrit 38.4 - 49.9 % 34.3(L) 23.5(L) 27.8(L)  Platelets 140 - 400 10e3/uL 137(L) 149 128(L)   CMP Latest Ref Rng 02/20/2015 02/13/2015 01/30/2015  Glucose 70 - 140 mg/dl 230(H) 199(H) 200(H)  BUN 7.0 - 26.0 mg/dL 15.6 12.2 20.2  Creatinine 0.7 - 1.3 mg/dL 1.0 0.9  1.0  Sodium 136 - 145 mEq/L 137 137 140  Potassium 3.5 - 5.1 mEq/L 4.3 4.3 4.1  Chloride 96 - 112 mmol/L - - -  CO2 22 - 29 mEq/L _0 Calcium 8.4 - 10.4 mg/dL 9.0 9.3 9.3  Total Protein 6.4 - 8.3 g/dL 6.6 6.9 6.7  Total Bilirubin 0.20 - 1.20 mg/dL <0.30 0.31 0.33  Alkaline Phos 40 - 150 U/L 122 141 97  AST 5 - 34 U/L _1 ALT 0 - 55 U/L _2 RADIOGRAPHIC STUDIES: Ct Chest W Contrast  02/12/2015  CLINICAL DATA:  Non-small cell poorly differentiated right upper lobe lung cancer diagnosed in February 2016, with bone and liver metastases identified in August 2016. Status post radiation therapy with ongoing chemotherapy. EXAM: CT CHEST WITH CONTRAST TECHNIQUE: Multidetector CT imaging of the chest was performed during intravenous contrast administration. CONTRAST:  43m OMNIPAQUE IOHEXOL 300 MG/ML  SOLN COMPARISON:  11/27/2014 chest CT. FINDINGS: Mediastinum/Nodes: Normal heart size. Mild pericardial fluid/thickening, slightly increased. There is atherosclerosis of the thoracic aorta, the great vessels of the mediastinum and the coronary arteries, including calcified atherosclerotic plaque in the left anterior descending and right coronary arteries. Great vessels are normal  in course and caliber. No central pulmonary emboli. Normal visualized thyroid. Normal esophagus. No axillary lymphadenopathy. A top-normal size 0.7 cm upper right paratracheal node (series 2/ image 12) is increased from 0.5 cm. A 0.7 cm prevascular left anterior mediastinal node (2/22) is increased from 0.3 cm. Stable moderately enlarged 2.0 cm right hilar node (2/29). Otherwise no pathologically enlarged mediastinal or hilar lymph nodes. Lungs/Pleura: No pneumothorax. Moderate layering right pleural effusion, increased. No left pleural effusion. There are stable postsurgical changes from left upper lobe wedge resection. The spiculated peripheral subpleural right upper lobe 0.8 cm right upper lobe pulmonary nodule  described on the 11/27/2014 chest CT appears decreased to 0.6 cm (series 5/ image 27). There is increased subpleural consolidation in the anterior right upper lobe with sharp posterior margin, in keeping with evolving postradiation change. Spiculated apical right upper lobe 0.5 cm pulmonary nodule (5/18) is unchanged since 03/10/2011 and benign. Spiculated peripheral subpleural right upper lobe 0.7 cm pulmonary nodule (5/24) is unchanged since 03/10/2011 and benign. These findings are superimposed on underlying peripheral basilar predominant interstitial lung disease characterized by patchy areas of subpleural reticulation, traction bronchiectasis and areas of frank honeycombing (such as in the right lower lobe on 5/34), with continued slow progression (with clear progression compared to the 03/10/2011 chest CT). No new significant pulmonary nodules or lung masses. Upper abdomen: Segment 2 left liver lobe hypodense 1.0 cm mass (2/55) is decreased from 2.5 cm. There are greater than 10 additional subcentimeter scattered hypodense lesions throughout the visualized liver, too small to characterize, not appreciably changed. Musculoskeletal: Stable lytic destructive osseous metastasis in the right T11 vertebral body with associated moderate pathologic right T11 vertebral body fracture with approximately 50% loss of right vertebral body height, slightly worsened. No new aggressive appearing focal osseous lesions in the chest. IMPRESSION: 1. Partial interval treatment response in the subcentimeter spiculated peripheral right upper lobe pulmonary nodule. Evolving postradiation change in the right upper lobe. 2. Stable right hilar lymphadenopathy. 3. Slight growth of subcentimeter right upper paratracheal and prevascular mediastinal lymph nodes, suspicious for enlarging nodal metastases. 4. Moderate layering right pleural effusion and mild pericardial fluid/thickening, both increased. 5. Partial interval treatment response  in the left liver lobe metastasis. 6. Stable lytic right T11 vertebral body osseous metastasis with slightly worsened loss of T11 vertebral body height. No new thoracic osseous metastases. 7. Underlying peripheral basilar predominant interstitial lung disease characterized by subpleural reticulation, traction bronchiectasis and areas of frank honeycombing, which continues to slowly progress since 2012, suggesting usual interstitial pneumonia (UIP). 8. Atherosclerosis, including two-vessel coronary artery disease. Please note that although the presence of coronary artery calcium documents the presence of coronary artery disease, the severity of this disease and any potential stenosis cannot be assessed on this non-gated CT examination. Electronically Signed   By: Ilona Sorrel M.D.   On: 02/12/2015 14:05    ASSESSMENT AND PLAN: This is a very pleasant 59years old white male with history of stage IIA non-small cell lung cancer status post a course of concurrent chemoradiation with weekly carboplatin and paclitaxel.  The recent CT scan of the chest showed interval decrease in the size of the left hilar mass and adenopathy. He is currently undergoing systemic chemotherapy with carboplatin and gemcitabine status post 4 cycles.  He tolerated the treatment well except for fatigue as well as lack of appetite and weight loss. These issues were addressed, He received blood transfusion on 11/1 and 11/7 with improvement of symptoms. He is also being followed  by a nutritionist regarding his appetite issues, with his next appointment on 11/22 His recent CT scan of the chest, abdomen and pelvis showed improvement of his disease. Will continue his current treatment with carboplatin and gemcitabine as a scheduled. The patient will receive cycle #5 today. He would come back for follow-up visit in 3 weeks for reevaluation before starting cycle #6. He would continue on Percocet for the back pain. CODE STATUS: no CODE BLUE He  was advised to call immediately if he has any concerning symptoms in the interval. The patient was encouraged to quit smoking and we offered him to smoke cessation program. The patient voices understanding of current disease status and treatment options and is in agreement with the current care plan.  All questions were answered. The patient knows to call the clinic with any problems, questions or concerns. We can certainly see the patient much sooner if necessary.   ADDENDUM: Hematology/Oncology Attending: I had a face to face encounter with the patient today. I recommended his care plan. This is a very pleasant 59 years old white male with metastatic non-small cell lung cancer, invasive poorly differentiated carcinoma currently undergoing systemic chemotherapy with carboplatin and gemcitabine status post 5 cycles. The patient related the last cycle of his treatment fairly well with no complaints except for fatigue. He recently received 2 units of PRBCs transfusion because of chemotherapy-induced anemia and he felt a little bit better. I recommended for the patient to proceed with cycle #5 today as scheduled. He would come back for follow-up visit in 3 weeks for reevaluation before starting cycle #6. The patient was advised to call immediately if he has any concerning symptoms in the interval.  Disclaimer: This note was dictated with voice recognition software. Similar sounding words can inadvertently be transcribed and may be missed upon review. Eilleen Kempf., MD 03/06/2015

## 2015-03-06 NOTE — Patient Instructions (Signed)
[facility] Discharge Instructions for Patients Receiving Chemotherapy  Today you received the following chemotherapy agents:  Carboplatin, Gemzar  To help prevent nausea and vomiting after your treatment, we encourage you to take your nausea medication as prescribed.   If you develop nausea and vomiting that is not controlled by your nausea medication, call the clinic.   BELOW ARE SYMPTOMS THAT SHOULD BE REPORTED IMMEDIATELY:  *FEVER GREATER THAN 100.5 F  *CHILLS WITH OR WITHOUT FEVER  NAUSEA AND VOMITING THAT IS NOT CONTROLLED WITH YOUR NAUSEA MEDICATION  *UNUSUAL SHORTNESS OF BREATH  *UNUSUAL BRUISING OR BLEEDING  TENDERNESS IN MOUTH AND THROAT WITH OR WITHOUT PRESENCE OF ULCERS  *URINARY PROBLEMS  *BOWEL PROBLEMS  UNUSUAL RASH Items with * indicate a potential emergency and should be followed up as soon as possible.  Feel free to call the clinic you have any questions or concerns. The clinic phone number is [phone].  Please show the CHEMO ALERT CARD at check-in to the Emergency Department and triage nurse.   

## 2015-03-13 ENCOUNTER — Other Ambulatory Visit (HOSPITAL_BASED_OUTPATIENT_CLINIC_OR_DEPARTMENT_OTHER): Payer: Medicaid Other

## 2015-03-13 ENCOUNTER — Ambulatory Visit: Payer: Medicaid Other | Admitting: Nutrition

## 2015-03-13 ENCOUNTER — Ambulatory Visit (HOSPITAL_BASED_OUTPATIENT_CLINIC_OR_DEPARTMENT_OTHER): Payer: Self-pay

## 2015-03-13 DIAGNOSIS — C3491 Malignant neoplasm of unspecified part of right bronchus or lung: Secondary | ICD-10-CM | POA: Diagnosis present

## 2015-03-13 LAB — CBC WITH DIFFERENTIAL/PLATELET
BASO%: 3.7 % — ABNORMAL HIGH (ref 0.0–2.0)
BASOS ABS: 0 10*3/uL (ref 0.0–0.1)
EOS ABS: 0 10*3/uL (ref 0.0–0.5)
EOS%: 1.3 % (ref 0.0–7.0)
HCT: 29.6 % — ABNORMAL LOW (ref 38.4–49.9)
HGB: 9.9 g/dL — ABNORMAL LOW (ref 13.0–17.1)
LYMPH%: 16.4 % (ref 14.0–49.0)
MCH: 31 pg (ref 27.2–33.4)
MCHC: 33.6 g/dL (ref 32.0–36.0)
MCV: 92.4 fL (ref 79.3–98.0)
MONO#: 0 10*3/uL — ABNORMAL LOW (ref 0.1–0.9)
MONO%: 3.2 % (ref 0.0–14.0)
NEUT#: 0.9 10*3/uL — ABNORMAL LOW (ref 1.5–6.5)
NEUT%: 75.4 % — ABNORMAL HIGH (ref 39.0–75.0)
Platelets: 182 10*3/uL (ref 140–400)
RBC: 3.21 10*6/uL — AB (ref 4.20–5.82)
RDW: 18.8 % — ABNORMAL HIGH (ref 11.0–14.6)
WBC: 1.2 10*3/uL — ABNORMAL LOW (ref 4.0–10.3)
lymph#: 0.2 10*3/uL — ABNORMAL LOW (ref 0.9–3.3)

## 2015-03-13 LAB — COMPREHENSIVE METABOLIC PANEL (CC13)
ALK PHOS: 115 U/L (ref 40–150)
ALT: 25 U/L (ref 0–55)
AST: 23 U/L (ref 5–34)
Albumin: 3.4 g/dL — ABNORMAL LOW (ref 3.5–5.0)
Anion Gap: 11 mEq/L (ref 3–11)
BUN: 13.8 mg/dL (ref 7.0–26.0)
CO2: 22 meq/L (ref 22–29)
Calcium: 9.4 mg/dL (ref 8.4–10.4)
Chloride: 105 mEq/L (ref 98–109)
Creatinine: 1 mg/dL (ref 0.7–1.3)
EGFR: 83 mL/min/{1.73_m2} — AB (ref >90)
GLUCOSE: 179 mg/dL — AB (ref 70–140)
POTASSIUM: 4.1 meq/L (ref 3.5–5.1)
SODIUM: 138 meq/L (ref 136–145)
Total Bilirubin: 0.64 mg/dL (ref 0.20–1.20)
Total Protein: 7 g/dL (ref 6.4–8.3)

## 2015-03-13 NOTE — Patient Instructions (Signed)
Neutropenia Neutropenia is a condition that occurs when the level of a certain type of white blood cell (neutrophil) in your body becomes lower than normal. Neutrophils are made in the bone marrow and fight infections. These cells protect against bacteria and viruses. The fewer neutrophils you have, and the longer your body remains without them, the greater your risk of getting a severe infection becomes. CAUSES  The cause of neutropenia may be hard to determine. However, it is usually due to 3 main problems:   Decreased production of neutrophils. This may be due to:  Certain medicines such as chemotherapy.  Genetic problems.  Cancer.  Radiation treatments.  Vitamin deficiency.  Some pesticides.  Increased destruction of neutrophils. This may be due to:  Overwhelming infections.  Hemolytic anemia. This is when the body destroys its own blood cells.  Chemotherapy.  Neutrophils moving to areas of the body where they cannot fight infections. This may be due to:  Dialysis procedures.  Conditions where the spleen becomes enlarged. Neutrophils are held in the spleen and are not available to the rest of the body.  Overwhelming infections. The neutrophils are held in the area of the infection and are not available to the rest of the body. SYMPTOMS  There are no specific symptoms of neutropenia. The lack of neutrophils can result in an infection, and an infection can cause various problems. DIAGNOSIS  Diagnosis is made by a blood test. A complete blood count is performed. The normal level of neutrophils in human blood differs with age and race. Infants have lower counts than older children and adults. African Americans have lower counts than Caucasians or Asians. The average adult level is 1500 cells/mm3 of blood. Neutrophil counts are interpreted as follows:  Greater than 1000 cells/mm3 gives normal protection against infection.  500 to 1000 cells/mm3 gives an increased risk for  infection.  200 to 500 cells/mm3 is a greater risk for severe infection.  Lower than 200 cells/mm3 is a marked risk of infection. This may require hospitalization and treatment with antibiotic medicines. TREATMENT  Treatment depends on the underlying cause, severity, and presence of infections or symptoms. It also depends on your health. Your caregiver will discuss the treatment plan with you. Mild cases are often easily treated and have a good outcome. Preventative measures may also be started to limit your risk of infections. Treatment can include:  Taking antibiotics.  Stopping medicines that are known to cause neutropenia.  Correcting nutritional deficiencies by eating green vegetables to supply folic acid and taking vitamin B supplements.  Stopping exposure to pesticides if your neutropenia is related to pesticide exposure.  Taking a blood growth factor called sargramostim, pegfilgrastim, or filgrastim if you are undergoing chemotherapy for cancer. This stimulates white blood cell production.  Removal of the spleen if you have Felty's syndrome and have repeated infections. HOME CARE INSTRUCTIONS   Follow your caregiver's instructions about when you need to have blood work done.  Wash your hands often. Make sure others who come in contact with you also wash their hands.  Wash raw fruits and vegetables before eating them. They can carry bacteria and fungi.  Avoid people with colds or spreadable (contagious) diseases (chickenpox, herpes zoster, influenza).  Avoid large crowds.  Avoid construction areas. The dust can release fungus into the air.  Be cautious around children in daycare or school environments.  Take care of your respiratory system by coughing and deep breathing.  Bathe daily.  Protect your skin from cuts and   burns.  Do not work in the garden or with flowers and plants.  Care for the mouth before and after meals by brushing with a soft toothbrush. If you have  mucositis, do not use mouthwash. Mouthwash contains alcohol and can dry out the mouth even more.  Clean the area between the genitals and the anus (perineal area) after urination and bowel movements. Women need to wipe from front to back.  Use a water soluble lubricant during sexual intercourse and practice good hygiene after. Do not have intercourse if you are severely neutropenic. Check with your caregiver for guidelines.  Exercise daily as tolerated.  Avoid people who were vaccinated with a live vaccine in the past 30 days. You should not receive live vaccines (polio, typhoid).  Do not provide direct care for pets. Avoid animal droppings. Do not clean litter boxes and bird cages.  Do not share food utensils.  Do not use tampons, enemas, or rectal suppositories unless directed by your caregiver.  Use an electric razor to remove hair.  Wash your hands after handling magazines, letters, and newspapers. SEEK IMMEDIATE MEDICAL CARE IF:   You have a fever.  You have chills or start to shake.  You feel nauseous or vomit.  You develop mouth sores.  You develop aches and pains.  You have redness and swelling around open wounds.  Your skin is warm to the touch.  You have pus coming from your wounds.  You develop swollen lymph nodes.  You feel weak or fatigued.  You develop red streaks on the skin. MAKE SURE YOU:  Understand these instructions.  Will watch your condition.  Will get help right away if you are not doing well or get worse.   This information is not intended to replace advice given to you by your health care provider. Make sure you discuss any questions you have with your health care provider.   Document Released: 09/27/2001 Document Revised: 06/30/2011 Document Reviewed: 10/18/2014 Elsevier Interactive Patient Education [employer].

## 2015-03-13 NOTE — Progress Notes (Signed)
Patient WBC 1.2 and ANC 0.9. Notified Dr. Julien Nordmann and pt to skip Day 8 Cycle 5 today. Will reevaluate labs in 2 weeks for his next appt and he will be seeing Dr. Julien Nordmann then. Provided some Neutropenic precaution education to patient at this time and called Barb (Dietician) to provide some nutritional supplement for patient. Printed AVS and calendar appt for pt. Pt understands neutropenic precautions and when to call for questions or concerns.

## 2015-03-13 NOTE — Progress Notes (Signed)
Nutrition follow-up completed with patient for non-small cell lung cancer. Weight improved in documented as 105.5 pounds November 15, increased from 104.5 pounds October 25. Patient is continuing to drink Glucerna oral nutrition supplements. Patient denies nutrition impact symptoms.  Nutrition diagnosis:  Unintended weight loss improved.  Intervention:  Patient educated to continue Glucerna 2-3 times daily. Provided additional samples and coupons. Provided support and active listening.  Monitoring, evaluation, goals: Patient will tolerate adequate calories and protein to promote weight gain.  Next visit: Tuesday, December 13, during infusion.  **Disclaimer: This note was dictated with voice recognition software. Similar sounding words can inadvertently be transcribed and this note may contain transcription errors which may not have been corrected upon publication of note.**

## 2015-03-20 ENCOUNTER — Other Ambulatory Visit (HOSPITAL_BASED_OUTPATIENT_CLINIC_OR_DEPARTMENT_OTHER): Payer: Medicaid Other

## 2015-03-20 DIAGNOSIS — C3411 Malignant neoplasm of upper lobe, right bronchus or lung: Secondary | ICD-10-CM

## 2015-03-20 DIAGNOSIS — C3491 Malignant neoplasm of unspecified part of right bronchus or lung: Secondary | ICD-10-CM

## 2015-03-20 LAB — CBC WITH DIFFERENTIAL/PLATELET
BASO%: 0.5 % (ref 0.0–2.0)
Basophils Absolute: 0 10*3/uL (ref 0.0–0.1)
EOS ABS: 0 10*3/uL (ref 0.0–0.5)
EOS%: 0.7 % (ref 0.0–7.0)
HCT: 30.3 % — ABNORMAL LOW (ref 38.4–49.9)
HGB: 9.8 g/dL — ABNORMAL LOW (ref 13.0–17.1)
LYMPH%: 8.2 % — AB (ref 14.0–49.0)
MCH: 30.2 pg (ref 27.2–33.4)
MCHC: 32.4 g/dL (ref 32.0–36.0)
MCV: 93.3 fL (ref 79.3–98.0)
MONO#: 0.9 10*3/uL (ref 0.1–0.9)
MONO%: 15 % — AB (ref 0.0–14.0)
NEUT%: 75.6 % — AB (ref 39.0–75.0)
NEUTROS ABS: 4.4 10*3/uL (ref 1.5–6.5)
PLATELETS: 80 10*3/uL — AB (ref 140–400)
RBC: 3.25 10*6/uL — AB (ref 4.20–5.82)
RDW: 19 % — ABNORMAL HIGH (ref 11.0–14.6)
WBC: 5.9 10*3/uL (ref 4.0–10.3)
lymph#: 0.5 10*3/uL — ABNORMAL LOW (ref 0.9–3.3)

## 2015-03-20 LAB — COMPREHENSIVE METABOLIC PANEL (CC13)
ALT: 17 U/L (ref 0–55)
ANION GAP: 12 meq/L — AB (ref 3–11)
AST: 19 U/L (ref 5–34)
Albumin: 3.4 g/dL — ABNORMAL LOW (ref 3.5–5.0)
Alkaline Phosphatase: 107 U/L (ref 40–150)
BUN: 13.5 mg/dL (ref 7.0–26.0)
CHLORIDE: 107 meq/L (ref 98–109)
CO2: 18 meq/L — AB (ref 22–29)
Calcium: 9.4 mg/dL (ref 8.4–10.4)
Creatinine: 1 mg/dL (ref 0.7–1.3)
EGFR: 85 mL/min/{1.73_m2} — AB (ref >90)
GLUCOSE: 127 mg/dL (ref 70–140)
POTASSIUM: 3.9 meq/L (ref 3.5–5.1)
SODIUM: 137 meq/L (ref 136–145)
Total Bilirubin: 0.48 mg/dL (ref 0.20–1.20)
Total Protein: 7.2 g/dL (ref 6.4–8.3)

## 2015-03-27 ENCOUNTER — Telehealth: Payer: Self-pay | Admitting: Internal Medicine

## 2015-03-27 ENCOUNTER — Ambulatory Visit (HOSPITAL_BASED_OUTPATIENT_CLINIC_OR_DEPARTMENT_OTHER): Payer: Medicaid Other

## 2015-03-27 ENCOUNTER — Telehealth: Payer: Self-pay | Admitting: *Deleted

## 2015-03-27 ENCOUNTER — Other Ambulatory Visit (HOSPITAL_BASED_OUTPATIENT_CLINIC_OR_DEPARTMENT_OTHER): Payer: Medicaid Other

## 2015-03-27 ENCOUNTER — Encounter: Payer: Self-pay | Admitting: Internal Medicine

## 2015-03-27 ENCOUNTER — Other Ambulatory Visit: Payer: Self-pay | Admitting: Medical Oncology

## 2015-03-27 ENCOUNTER — Ambulatory Visit (HOSPITAL_BASED_OUTPATIENT_CLINIC_OR_DEPARTMENT_OTHER): Payer: Medicaid Other | Admitting: Internal Medicine

## 2015-03-27 VITALS — BP 101/64 | HR 101 | Temp 97.5°F | Resp 18 | Ht 65.0 in | Wt 106.1 lb

## 2015-03-27 VITALS — HR 96

## 2015-03-27 DIAGNOSIS — R630 Anorexia: Secondary | ICD-10-CM | POA: Diagnosis not present

## 2015-03-27 DIAGNOSIS — Z23 Encounter for immunization: Secondary | ICD-10-CM

## 2015-03-27 DIAGNOSIS — C3490 Malignant neoplasm of unspecified part of unspecified bronchus or lung: Secondary | ICD-10-CM

## 2015-03-27 DIAGNOSIS — Z720 Tobacco use: Secondary | ICD-10-CM

## 2015-03-27 DIAGNOSIS — R5383 Other fatigue: Secondary | ICD-10-CM | POA: Diagnosis not present

## 2015-03-27 DIAGNOSIS — C3491 Malignant neoplasm of unspecified part of right bronchus or lung: Secondary | ICD-10-CM

## 2015-03-27 DIAGNOSIS — Z5111 Encounter for antineoplastic chemotherapy: Secondary | ICD-10-CM | POA: Diagnosis present

## 2015-03-27 DIAGNOSIS — R634 Abnormal weight loss: Secondary | ICD-10-CM

## 2015-03-27 DIAGNOSIS — M545 Low back pain: Secondary | ICD-10-CM | POA: Diagnosis not present

## 2015-03-27 DIAGNOSIS — T451X5A Adverse effect of antineoplastic and immunosuppressive drugs, initial encounter: Secondary | ICD-10-CM

## 2015-03-27 DIAGNOSIS — R110 Nausea: Secondary | ICD-10-CM

## 2015-03-27 LAB — COMPREHENSIVE METABOLIC PANEL
ALT: 10 U/L (ref 0–55)
AST: 14 U/L (ref 5–34)
Albumin: 3.4 g/dL — ABNORMAL LOW (ref 3.5–5.0)
Alkaline Phosphatase: 116 U/L (ref 40–150)
Anion Gap: 13 mEq/L — ABNORMAL HIGH (ref 3–11)
BUN: 13 mg/dL (ref 7.0–26.0)
CALCIUM: 9.2 mg/dL (ref 8.4–10.4)
CHLORIDE: 107 meq/L (ref 98–109)
CO2: 20 meq/L — AB (ref 22–29)
Creatinine: 1.1 mg/dL (ref 0.7–1.3)
EGFR: 74 mL/min/{1.73_m2} — ABNORMAL LOW (ref >90)
GLUCOSE: 191 mg/dL — AB (ref 70–140)
POTASSIUM: 4 meq/L (ref 3.5–5.1)
Sodium: 140 mEq/L (ref 136–145)
Total Bilirubin: 0.36 mg/dL (ref 0.20–1.20)
Total Protein: 7.3 g/dL (ref 6.4–8.3)

## 2015-03-27 LAB — CBC WITH DIFFERENTIAL/PLATELET
BASO%: 0.7 % (ref 0.0–2.0)
BASOS ABS: 0 10*3/uL (ref 0.0–0.1)
EOS%: 1.6 % (ref 0.0–7.0)
Eosinophils Absolute: 0.1 10*3/uL (ref 0.0–0.5)
HCT: 32.2 % — ABNORMAL LOW (ref 38.4–49.9)
HGB: 10.5 g/dL — ABNORMAL LOW (ref 13.0–17.1)
LYMPH%: 12.4 % — AB (ref 14.0–49.0)
MCH: 32.1 pg (ref 27.2–33.4)
MCHC: 32.5 g/dL (ref 32.0–36.0)
MCV: 98.8 fL — AB (ref 79.3–98.0)
MONO#: 0.7 10*3/uL (ref 0.1–0.9)
MONO%: 16.6 % — ABNORMAL HIGH (ref 0.0–14.0)
NEUT#: 2.8 10*3/uL (ref 1.5–6.5)
NEUT%: 68.7 % (ref 39.0–75.0)
PLATELETS: 206 10*3/uL (ref 140–400)
RBC: 3.26 10*6/uL — AB (ref 4.20–5.82)
RDW: 23.7 % — ABNORMAL HIGH (ref 11.0–14.6)
WBC: 4.1 10*3/uL (ref 4.0–10.3)
lymph#: 0.5 10*3/uL — ABNORMAL LOW (ref 0.9–3.3)

## 2015-03-27 MED ORDER — SODIUM CHLORIDE 0.9 % IV SOLN
Freq: Once | INTRAVENOUS | Status: AC
  Administered 2015-03-27: 13:00:00 via INTRAVENOUS

## 2015-03-27 MED ORDER — CARBOPLATIN CHEMO INTRADERMAL TEST DOSE 100MCG/0.02ML
100.0000 ug | Freq: Once | INTRADERMAL | Status: AC
  Administered 2015-03-27: 100 ug via INTRADERMAL
  Filled 2015-03-27: qty 0.01

## 2015-03-27 MED ORDER — PROCHLORPERAZINE MALEATE 10 MG PO TABS: 10.0000 mg | ORAL_TABLET | Freq: Four times a day (QID) | ORAL | Status: AC | PRN

## 2015-03-27 MED ORDER — SODIUM CHLORIDE 0.9 % IV SOLN
800.0000 mg/m2 | Freq: Once | INTRAVENOUS | Status: AC
  Administered 2015-03-27: 1216 mg via INTRAVENOUS
  Filled 2015-03-27: qty 31.98

## 2015-03-27 MED ORDER — INFLUENZA VAC SPLIT QUAD 0.5 ML IM SUSY
0.5000 mL | PREFILLED_SYRINGE | Freq: Once | INTRAMUSCULAR | Status: AC
  Administered 2015-03-27: 0.5 mL via INTRAMUSCULAR
  Filled 2015-03-27: qty 0.5

## 2015-03-27 MED ORDER — SODIUM CHLORIDE 0.9 % IV SOLN
302.8000 mg | Freq: Once | INTRAVENOUS | Status: AC
  Administered 2015-03-27: 300 mg via INTRAVENOUS
  Filled 2015-03-27: qty 30

## 2015-03-27 MED ORDER — SODIUM CHLORIDE 0.9 % IV SOLN
Freq: Once | INTRAVENOUS | Status: AC
  Administered 2015-03-27: 14:00:00 via INTRAVENOUS
  Filled 2015-03-27: qty 8

## 2015-03-27 NOTE — Progress Notes (Signed)
Adam Warner   Fax:([phone]) [place], [place] [address]  DIAGNOSIS: Metastatic non-small cell lung cancer initially diagnosed as Unresectable a stage IIA (T1a, N1, M0) non-small cell lung cancer, invasive poorly differentiated carcinoma diagnosed in March 2016  PRIOR THERAPY:  1) A course of concurrent chemoradiation with weekly carboplatin for AUC of 2 and paclitaxel 45 MG/M2. 2) concurrent chemoradiation with weekly carboplatin for AUC of 2 and paclitaxel 45 mg/m2 resumed on 10/09/14. Completed his last dose on 10/24/14.  CURRENT THERAPY: Systemic chemotherapy with carboplatin for AUC of 5 on day 1 and gemcitabine 1000 MG/M2 on days 1 and 8 every 3 weeks. First dose on 12/12/2014. Status post 5 cycles.  INTERVAL HISTORY: Adam Warner 59 y.o. male returns to the clinic today for follow-up visit. The patient is feeling fine and tolerating his systemic chemotherapy fairly well except for fatigue and weakness as well as lack of appetite and weight loss. He also continues to have low back pain. He denied having any significant chest pain, but has shortness breath with exertion with no cough or hemoptysis. He denied having any significant nausea or vomiting, no fever or chills. He received 2 units of PRBCs transfusion recently secondary to chemotherapy-induced anemia. He is here today to start cycle #6 of his systemic chemotherapy.  MEDICAL HISTORY: Past Medical History  Diagnosis Date  . COPD (chronic obstructive pulmonary disease) ([place])   . Stroke ([place])   . Hypertension   . Heart attack ([place]) 1991  . Pulmonary nodule   . Coronary artery disease     sees Dr Spero Curb at [facility] every 2-3 years  . Shortness of breath dyspnea   . Full dentures   . Diabetes mellitus     Type Niddm x 2 years  . DNR no code (do not resuscitate) 01/23/2015  . DNR no code (do not resuscitate) 01/23/2015     ALLERGIES:  is allergic to zyban.  MEDICATIONS:  Current Outpatient Prescriptions  Medication Sig Dispense Refill  . ACCU-CHEK AVIVA PLUS test strip USE TO CHECK ONCE OR TWICE DAILY 100 each 0  . ACCU-CHEK FASTCLIX LANCETS MISC Check blood sugar bid and prn 102 each 0  . aspirin EC 325 MG tablet Take 325 mg by mouth every morning. Hold for surgery    . atorvastatin (LIPITOR) 40 MG tablet TAKE ONE TABLET BY MOUTH ONE TIME DAILY 30 tablet 6  . Blood Glucose Monitoring Suppl (ONE TOUCH ULTRA 2) W/DEVICE KIT Use monitor to test blood sugar qd Dx 250.00 1 each 0  . Cholecalciferol (VITAMIN D) 2000 UNITS CAPS Take 1 capsule by mouth daily.    . feeding supplement, GLUCERNA SHAKE, (GLUCERNA SHAKE) LIQD Take 237 mLs by mouth 2 (two) times daily between meals. 60 Can 5  . metFORMIN (GLUCOPHAGE) 500 MG tablet Take 1 tablet (500 mg total) by mouth 2 (two) times daily with a meal. 60 tablet 2  . oxyCODONE-acetaminophen (PERCOCET/ROXICET) 5-325 MG per tablet Take 1 tablet by mouth every 6 (six) hours as needed for moderate pain or severe pain. 30 tablet 0  . prochlorperazine (COMPAZINE) 10 MG tablet Take 1 tablet (10 mg total) by mouth every 6 (six) hours as needed for nausea or vomiting. 30 tablet 0  . tiotropium (SPIRIVA) 18 MCG inhalation capsule Place 1 capsule (18 mcg total) into inhaler and inhale daily. 30 capsule 6  . valsartan (DIOVAN) 320  MG tablet Take 1 tablet (320 mg total) by mouth daily. 30 tablet 6   Current Facility-Administered Medications  Medication Dose Route Frequency Provider Last Rate Last Dose  . Influenza vac split quadrivalent PF (FLUARIX) injection 0.5 mL  0.5 mL Intramuscular Once Curt Bears, MD        SURGICAL HISTORY:  Past Surgical History  Procedure Laterality Date  . Lung surgery      Dr Arlyce Dice  . Angioplasty    . Colonoscopy  10/22/2011    Procedure: COLONOSCOPY;  Surgeon: Rogene Houston, MD;  Location: [facility];  Service: Endoscopy;  Laterality:  N/A;  1200  . Carotid endarterectomy Right   . Video bronchoscopy with endobronchial ultrasound N/A 05/29/2014    Procedure: VIDEO BRONCHOSCOPY WITH ENDOBRONCHIAL ULTRASOUND;  Surgeon: Melrose Nakayama, MD;  Location: [facility];  Service: Thoracic;  Laterality: N/A;    REVIEW OF SYSTEMS:  A comprehensive review of systems was negative except for: Constitutional: positive for anorexia, fatigue and weight loss Respiratory: positive for dyspnea on exertion Musculoskeletal: positive for back pain   PHYSICAL EXAMINATION: General appearance: alert, cooperative and no distress Head: Normocephalic, without obvious abnormality, atraumatic Neck: no adenopathy, no JVD, supple, symmetrical, trachea midline and thyroid not enlarged, symmetric, no tenderness/mass/nodules Lymph nodes: Cervical, supraclavicular, and axillary nodes normal. Resp: clear to auscultation bilaterally Back: symmetric, no curvature. ROM normal. No CVA tenderness. Cardio: regular rate and rhythm, S1, S2 normal, no murmur, click, rub or gallop GI: soft, non-tender; bowel sounds normal; no masses,  no organomegaly Extremities: extremities normal, atraumatic, no cyanosis or edema Neurologic: Alert and oriented X 3, normal strength and tone. Normal symmetric reflexes. Normal coordination and gait  ECOG PERFORMANCE STATUS: 1 - Symptomatic but completely ambulatory  Blood pressure 101/64, pulse 101, temperature 97.5 F (36.4 C), temperature source Oral, resp. rate 18, height $RemoveBe'5\' 5"'hgbJCURoZ$  (1.651 m), weight 106 lb 1.6 oz (48.127 kg), SpO2 99 %.  LABORATORY DATA: Lab Results  Component Value Date   WBC 4.1 03/27/2015   HGB 10.5* 03/27/2015   HCT 32.2* 03/27/2015   MCV 98.8* 03/27/2015   PLT 206 03/27/2015      Chemistry      Component Value Date/Time   NA 140 03/27/2015 1118   NA 136 05/23/2014 1015   NA 139 04/12/2014 1029   K 4.0 03/27/2015 1118   K 4.1 05/23/2014 1015   CL 106 05/23/2014 1015   CO2 20* 03/27/2015 1118   CO2 25  05/23/2014 1015   BUN 13.0 03/27/2015 1118   BUN 10 05/23/2014 1015   BUN 12 04/12/2014 1029   CREATININE 1.1 03/27/2015 1118   CREATININE 1.07 05/23/2014 1015   CREATININE 1.05 11/04/2012 1242      Component Value Date/Time   CALCIUM 9.2 03/27/2015 1118   CALCIUM 9.3 05/23/2014 1015   ALKPHOS 116 03/27/2015 1118   ALKPHOS 90 05/23/2014 1015   AST 14 03/27/2015 1118   AST 19 05/23/2014 1015   ALT 10 03/27/2015 1118   ALT 17 05/23/2014 1015   BILITOT 0.36 03/27/2015 1118   BILITOT 0.6 05/23/2014 1015       RADIOGRAPHIC STUDIES: No results found.  ASSESSMENT AND PLAN: This is a very pleasant 59years old white male with history of stage IIA non-small cell lung cancer status post a course of concurrent chemoradiation with weekly carboplatin and paclitaxel. The recent CT scan of the chest showed interval decrease in the size of the left hilar mass and adenopathy. He  is currently undergoing systemic chemotherapy with carboplatin and gemcitabine status post 5 ycles He tolerated the treatment well except for fatigue as well as lack of appetite and weight loss. He also has low back pain. I recommended for the patient to proceed with cycle #6 of his systemic chemotherapy today as scheduled. He would come back for follow-up visit in 3 weeks for reevaluation after repeating CT scan of the chest, abdomen and pelvis for restaging of his disease. He would continue on Percocet for the back pain. CODE STATUS: no CODE BLUE He was advised to call immediately if he has any concerning symptoms in the interval. The patient was encouraged to quit smoking and we offered him to smoke cessation program. The patient voices understanding of current disease status and treatment options and is in agreement with the current care plan.  All questions were answered. The patient knows to call the clinic with any problems, questions or concerns. We can certainly see the patient much sooner if  necessary.  Disclaimer: This note was dictated with voice recognition software. Similar sounding words can inadvertently be transcribed and may not be corrected upon review.

## 2015-03-27 NOTE — Telephone Encounter (Signed)
Per staff message and POF I have scheduled appts. Advised scheduler of appts. JMW  

## 2015-03-27 NOTE — Telephone Encounter (Signed)
Gave and printed appt sched and avs for pt for DEC adn Jan

## 2015-03-27 NOTE — Patient Instructions (Signed)
[facility] Discharge Instructions for Patients Receiving Chemotherapy  Today you received the following chemotherapy agents: Carboplatin and Gemzar   To help prevent nausea and vomiting after your treatment, we encourage you to take your nausea medication as directed.    If you develop nausea and vomiting that is not controlled by your nausea medication, call the clinic.   BELOW ARE SYMPTOMS THAT SHOULD BE REPORTED IMMEDIATELY:  *FEVER GREATER THAN 100.5 F  *CHILLS WITH OR WITHOUT FEVER  NAUSEA AND VOMITING THAT IS NOT CONTROLLED WITH YOUR NAUSEA MEDICATION  *UNUSUAL SHORTNESS OF BREATH  *UNUSUAL BRUISING OR BLEEDING  TENDERNESS IN MOUTH AND THROAT WITH OR WITHOUT PRESENCE OF ULCERS  *URINARY PROBLEMS  *BOWEL PROBLEMS  UNUSUAL RASH Items with * indicate a potential emergency and should be followed up as soon as possible.  Feel free to call the clinic you have any questions or concerns. The clinic phone number is [phone].  Please show the [place] at check-in to the Emergency Department and triage nurse.

## 2015-03-27 NOTE — Patient Instructions (Signed)
Smoking Cessation, Tips for Success If you are ready to quit smoking, congratulations! You have chosen to help yourself be healthier. Cigarettes bring nicotine, tar, carbon monoxide, and other irritants into your body. Your lungs, heart, and blood vessels will be able to work better without these poisons. There are many different ways to quit smoking. Nicotine gum, nicotine patches, a nicotine inhaler, or nicotine nasal spray can help with physical craving. Hypnosis, support groups, and medicines help break the habit of smoking. WHAT THINGS CAN I DO TO MAKE QUITTING EASIER?  Here are some tips to help you quit for good:  Pick a date when you will quit smoking completely. Tell all of your friends and family about your plan to quit on that date.  Do not try to slowly cut down on the number of cigarettes you are smoking. Pick a quit date and quit smoking completely starting on that day.  Throw away all cigarettes.   Clean and remove all ashtrays from your home, work, and car.  On a card, write down your reasons for quitting. Carry the card with you and read it when you get the urge to smoke.  Cleanse your body of nicotine. Drink enough water and fluids to keep your urine clear or pale yellow. Do this after quitting to flush the nicotine from your body.  Learn to predict your moods. Do not let a bad situation be your excuse to have a cigarette. Some situations in your life might tempt you into wanting a cigarette.  Never have "just one" cigarette. It leads to wanting another and another. Remind yourself of your decision to quit.  Change habits associated with smoking. If you smoked while driving or when feeling stressed, try other activities to replace smoking. Stand up when drinking your coffee. Brush your teeth after eating. Sit in a different chair when you read the paper. Avoid alcohol while trying to quit, and try to drink fewer caffeinated beverages. Alcohol and caffeine may urge you to  smoke.  Avoid foods and drinks that can trigger a desire to smoke, such as sugary or spicy foods and alcohol.  Ask people who smoke not to smoke around you.  Have something planned to do right after eating or having a cup of coffee. For example, plan to take a walk or exercise.  Try a relaxation exercise to calm you down and decrease your stress. Remember, you may be tense and nervous for the first 2 weeks after you quit, but this will pass.  Find new activities to keep your hands busy. Play with a pen, coin, or rubber band. Doodle or draw things on paper.  Brush your teeth right after eating. This will help cut down on the craving for the taste of tobacco after meals. You can also try mouthwash.   Use oral substitutes in place of cigarettes. Try using lemon drops, carrots, cinnamon sticks, or chewing gum. Keep them handy so they are available when you have the urge to smoke.  When you have the urge to smoke, try deep breathing.  Designate your home as a nonsmoking area.  If you are a heavy smoker, ask your health care provider about a prescription for nicotine chewing gum. It can ease your withdrawal from nicotine.  Reward yourself. Set aside the cigarette money you save and buy yourself something nice.  Look for support from others. Join a support group or smoking cessation program. Ask someone at home or at work to help you with your plan   to quit smoking.  Always ask yourself, "Do I need this cigarette or is this just a reflex?" Tell yourself, "Today, I choose not to smoke," or "I do not want to smoke." You are reminding yourself of your decision to quit.  Do not replace cigarette smoking with electronic cigarettes (commonly called e-cigarettes). The safety of e-cigarettes is unknown, and some may contain harmful chemicals.  If you relapse, do not give up! Plan ahead and think about what you will do the next time you get the urge to smoke. HOW WILL I FEEL WHEN I QUIT SMOKING? You  may have symptoms of withdrawal because your body is used to nicotine (the addictive substance in cigarettes). You may crave cigarettes, be irritable, feel very hungry, cough often, get headaches, or have difficulty concentrating. The withdrawal symptoms are only temporary. They are strongest when you first quit but will go away within 10-14 days. When withdrawal symptoms occur, stay in control. Think about your reasons for quitting. Remind yourself that these are signs that your body is healing and getting used to being without cigarettes. Remember that withdrawal symptoms are easier to treat than the major diseases that smoking can cause.  Even after the withdrawal is over, expect periodic urges to smoke. However, these cravings are generally short lived and will go away whether you smoke or not. Do not smoke! WHAT RESOURCES ARE AVAILABLE TO HELP ME QUIT SMOKING? Your health care provider can direct you to community resources or hospitals for support, which may include:  Group support.  Education.  Hypnosis.  Therapy.   This information is not intended to replace advice given to you by your health care provider. Make sure you discuss any questions you have with your health care provider.   Document Released: 01/04/2004 Document Revised: 04/28/2014 Document Reviewed: 09/23/2012 Elsevier Interactive Patient Education 2016 [employer]  

## 2015-04-03 ENCOUNTER — Ambulatory Visit: Payer: Medicaid Other | Admitting: Nutrition

## 2015-04-03 ENCOUNTER — Other Ambulatory Visit (HOSPITAL_BASED_OUTPATIENT_CLINIC_OR_DEPARTMENT_OTHER): Payer: Medicaid Other

## 2015-04-03 ENCOUNTER — Ambulatory Visit (HOSPITAL_BASED_OUTPATIENT_CLINIC_OR_DEPARTMENT_OTHER): Payer: Medicaid Other

## 2015-04-03 VITALS — BP 111/77 | HR 100 | Temp 98.0°F

## 2015-04-03 DIAGNOSIS — C3490 Malignant neoplasm of unspecified part of unspecified bronchus or lung: Secondary | ICD-10-CM | POA: Diagnosis not present

## 2015-04-03 DIAGNOSIS — Z5111 Encounter for antineoplastic chemotherapy: Secondary | ICD-10-CM

## 2015-04-03 LAB — CBC WITH DIFFERENTIAL/PLATELET
BASO%: 1.3 % (ref 0.0–2.0)
Basophils Absolute: 0 10*3/uL (ref 0.0–0.1)
EOS%: 1.3 % (ref 0.0–7.0)
Eosinophils Absolute: 0 10*3/uL (ref 0.0–0.5)
HCT: 30.6 % — ABNORMAL LOW (ref 38.4–49.9)
HEMOGLOBIN: 10 g/dL — AB (ref 13.0–17.1)
LYMPH%: 20.2 % (ref 14.0–49.0)
MCH: 31.6 pg (ref 27.2–33.4)
MCHC: 32.7 g/dL (ref 32.0–36.0)
MCV: 96.8 fL (ref 79.3–98.0)
MONO#: 0.1 10*3/uL (ref 0.1–0.9)
MONO%: 4.8 % (ref 0.0–14.0)
NEUT%: 72.4 % (ref 39.0–75.0)
NEUTROS ABS: 1.7 10*3/uL (ref 1.5–6.5)
Platelets: 134 10*3/uL — ABNORMAL LOW (ref 140–400)
RBC: 3.16 10*6/uL — AB (ref 4.20–5.82)
RDW: 19.5 % — AB (ref 11.0–14.6)
WBC: 2.3 10*3/uL — AB (ref 4.0–10.3)
lymph#: 0.5 10*3/uL — ABNORMAL LOW (ref 0.9–3.3)

## 2015-04-03 LAB — COMPREHENSIVE METABOLIC PANEL
ALBUMIN: 3.5 g/dL (ref 3.5–5.0)
ALK PHOS: 101 U/L (ref 40–150)
ALT: 18 U/L (ref 0–55)
AST: 18 U/L (ref 5–34)
Anion Gap: 11 mEq/L (ref 3–11)
BILIRUBIN TOTAL: 0.47 mg/dL (ref 0.20–1.20)
BUN: 18.2 mg/dL (ref 7.0–26.0)
CO2: 19 meq/L — AB (ref 22–29)
Calcium: 9.2 mg/dL (ref 8.4–10.4)
Chloride: 109 mEq/L (ref 98–109)
Creatinine: 1 mg/dL (ref 0.7–1.3)
EGFR: 82 mL/min/{1.73_m2} — ABNORMAL LOW (ref >90)
GLUCOSE: 132 mg/dL (ref 70–140)
POTASSIUM: 4.2 meq/L (ref 3.5–5.1)
SODIUM: 140 meq/L (ref 136–145)
TOTAL PROTEIN: 7.3 g/dL (ref 6.4–8.3)

## 2015-04-03 MED ORDER — SODIUM CHLORIDE 0.9 % IV SOLN
Freq: Once | INTRAVENOUS | Status: AC
  Administered 2015-04-03: 11:00:00 via INTRAVENOUS

## 2015-04-03 MED ORDER — SODIUM CHLORIDE 0.9 % IV SOLN
800.0000 mg/m2 | Freq: Once | INTRAVENOUS | Status: AC
  Administered 2015-04-03: 1216 mg via INTRAVENOUS
  Filled 2015-04-03: qty 31.98

## 2015-04-03 MED ORDER — PROCHLORPERAZINE MALEATE 10 MG PO TABS
10.0000 mg | ORAL_TABLET | Freq: Once | ORAL | Status: AC
  Administered 2015-04-03: 10 mg via ORAL

## 2015-04-03 MED ORDER — PROCHLORPERAZINE MALEATE 10 MG PO TABS
ORAL_TABLET | ORAL | Status: AC
  Filled 2015-04-03: qty 1

## 2015-04-03 NOTE — Patient Instructions (Signed)
[facility] Discharge Instructions for Patients Receiving Chemotherapy  Today you received the following chemotherapy agents Gemzar  To help prevent nausea and vomiting after your treatment, we encourage you to take your nausea medication as directed.    If you develop nausea and vomiting that is not controlled by your nausea medication, call the clinic.   BELOW ARE SYMPTOMS THAT SHOULD BE REPORTED IMMEDIATELY:  *FEVER GREATER THAN 100.5 F  *CHILLS WITH OR WITHOUT FEVER  NAUSEA AND VOMITING THAT IS NOT CONTROLLED WITH YOUR NAUSEA MEDICATION  *UNUSUAL SHORTNESS OF BREATH  *UNUSUAL BRUISING OR BLEEDING  TENDERNESS IN MOUTH AND THROAT WITH OR WITHOUT PRESENCE OF ULCERS  *URINARY PROBLEMS  *BOWEL PROBLEMS  UNUSUAL RASH Items with * indicate a potential emergency and should be followed up as soon as possible.  Feel free to call the clinic you have any questions or concerns. The clinic phone number is [phone].  Please show the CHEMO ALERT CARD at check-in to the Emergency Department and triage nurse.   

## 2015-04-03 NOTE — Progress Notes (Signed)
Nutrition follow-up completed with patient during infusion for non-small cell lung cancer. Weight improved and documented as 106.1 pounds, December 6 increased from 105.5 pounds November 15. Patient states he continues to drink Glucerna oral nutrition supplements. He has also purchased Glucerna snack bars and enjoys those Denies nutrition impact symptoms.  Nutrition diagnosis:  Unintended weight loss improved.  Intervention:  Educated patient to continue Glucerna 3 times a day Teach back method used.  Monitoring, evaluation, goals: Patient will tolerate adequate calories and protein to promote continued weight gain.  Next visit: Tuesday, January 3, during infusion.  **Disclaimer: This note was dictated with voice recognition software. Similar sounding words can inadvertently be transcribed and this note may contain transcription errors which may not have been corrected upon publication of note.**

## 2015-04-05 ENCOUNTER — Ambulatory Visit: Payer: Self-pay | Admitting: Family Medicine

## 2015-04-05 ENCOUNTER — Other Ambulatory Visit: Payer: Self-pay

## 2015-04-09 ENCOUNTER — Ambulatory Visit (INDEPENDENT_AMBULATORY_CARE_PROVIDER_SITE_OTHER): Payer: Medicaid Other | Admitting: Family Medicine

## 2015-04-09 ENCOUNTER — Ambulatory Visit (HOSPITAL_COMMUNITY): Payer: Medicaid Other

## 2015-04-09 ENCOUNTER — Telehealth: Payer: Self-pay | Admitting: Oncology

## 2015-04-09 ENCOUNTER — Other Ambulatory Visit: Payer: Self-pay | Admitting: Family Medicine

## 2015-04-09 ENCOUNTER — Encounter: Payer: Self-pay | Admitting: Family Medicine

## 2015-04-09 VITALS — BP 107/72 | HR 122 | Temp 97.7°F | Ht 65.0 in | Wt 105.0 lb

## 2015-04-09 DIAGNOSIS — E559 Vitamin D deficiency, unspecified: Secondary | ICD-10-CM

## 2015-04-09 DIAGNOSIS — C3491 Malignant neoplasm of unspecified part of right bronchus or lung: Secondary | ICD-10-CM | POA: Diagnosis not present

## 2015-04-09 DIAGNOSIS — E785 Hyperlipidemia, unspecified: Secondary | ICD-10-CM | POA: Diagnosis not present

## 2015-04-09 DIAGNOSIS — I10 Essential (primary) hypertension: Secondary | ICD-10-CM | POA: Diagnosis not present

## 2015-04-09 DIAGNOSIS — E119 Type 2 diabetes mellitus without complications: Secondary | ICD-10-CM

## 2015-04-09 DIAGNOSIS — N400 Enlarged prostate without lower urinary tract symptoms: Secondary | ICD-10-CM

## 2015-04-09 DIAGNOSIS — I700 Atherosclerosis of aorta: Secondary | ICD-10-CM | POA: Diagnosis not present

## 2015-04-09 DIAGNOSIS — J449 Chronic obstructive pulmonary disease, unspecified: Secondary | ICD-10-CM

## 2015-04-09 LAB — POCT GLYCOSYLATED HEMOGLOBIN (HGB A1C): Hemoglobin A1C: 6.7

## 2015-04-09 MED ORDER — OXYCODONE-ACETAMINOPHEN 5-325 MG PO TABS: 1.0000 | ORAL_TABLET | Freq: Four times a day (QID) | ORAL | Status: DC | PRN

## 2015-04-09 NOTE — Addendum Note (Signed)
Addended by: Zannie Cove on: 04/09/2015 12:34 PM   Modules accepted: Orders

## 2015-04-09 NOTE — Telephone Encounter (Signed)
Received a call from [facility] at Dr. Tawanna Sat office in [place].  She said that Dr. Laurance Flatten will refill [name] that Dr. Sondra Come has refilled previously on 03/19/15.

## 2015-04-09 NOTE — Progress Notes (Signed)
Subjective:    Patient ID: Adam Warner, male    DOB: May 16, 1955, 59 y.o.   MRN: [id number]  HPI Pt here for follow up and management of chronic medical problems which includes diabetes and hyperlipidemia. He is taking medications regularly. This patient is followed regularly by oncology and has metastatic non-small cell lung cancer. He has one more chemotherapy treatment. He is having some back pain and this is most likely from his cancer. He will be given an FOBT to return today and he will get lab work that has not been checked by oncology. The patient's weight is stable compared to the first of the month. The patient continues to feel weak and tired and had no energy. He is also having spine and back pain and numbness in his legs. This is due to most likely due to the metastatic cancer to the spine.      Patient Active Problem List   Diagnosis Date Noted  . Anemia in neoplastic disease 03/06/2015  . DNR no code (do not resuscitate) 01/23/2015  . DNR no code (do not resuscitate) 01/23/2015  . Metastasis to bone ([place]) 12/06/2014  . Encounter for antineoplastic chemotherapy 10/10/2014  . Non-small cell carcinoma of lung, stage 2 ([place]) 06/22/2014  . FH: lung cancer 06/13/2014  . CAD, NATIVE VESSEL 03/08/2010  . CAROTID ARTERY STENOSIS, WITHOUT INFARCTION 03/08/2010  . LEG PAIN 03/08/2010  . SYNCOPE AND COLLAPSE 03/08/2010  . ABNORMAL CV (STRESS) TEST 03/08/2010  . Hyperlipidemia 02/12/2010  . SMOKER 02/12/2010  . C V A / STROKE 02/12/2010  . COPD GOLD II 02/12/2010  . Pulmonary nodule 02/12/2010  . ABNORMAL LUNG XRAY 02/12/2010  . Diabetes ([place]) 02/11/2010  . Essential hypertension 02/11/2010  . CAD 02/11/2010  . TIA 02/11/2010   Outpatient Encounter Prescriptions as of 04/09/2015  Medication Sig  . ACCU-CHEK AVIVA PLUS test strip USE TO CHECK ONCE OR TWICE DAILY  . ACCU-CHEK FASTCLIX LANCETS MISC Check blood sugar bid and prn  . aspirin EC 325 MG tablet Take 325 mg by mouth  every morning. Hold for surgery  . atorvastatin (LIPITOR) 40 MG tablet TAKE ONE TABLET BY MOUTH ONE TIME DAILY  . Blood Glucose Monitoring Suppl (ONE TOUCH ULTRA 2) W/DEVICE KIT Use monitor to test blood sugar qd Dx 250.00  . Cholecalciferol (VITAMIN D) 2000 UNITS CAPS Take 1 capsule by mouth daily.  . feeding supplement, GLUCERNA SHAKE, (GLUCERNA SHAKE) LIQD Take 237 mLs by mouth 2 (two) times daily between meals.  . metFORMIN (GLUCOPHAGE) 500 MG tablet Take 1 tablet (500 mg total) by mouth 2 (two) times daily with a meal.  . oxyCODONE-acetaminophen (PERCOCET/ROXICET) 5-325 MG per tablet Take 1 tablet by mouth every 6 (six) hours as needed for moderate pain or severe pain.  Marland Kitchen prochlorperazine (COMPAZINE) 10 MG tablet Take 1 tablet (10 mg total) by mouth every 6 (six) hours as needed for nausea or vomiting.  . tiotropium (SPIRIVA) 18 MCG inhalation capsule Place 1 capsule (18 mcg total) into inhaler and inhale daily.  . valsartan (DIOVAN) 320 MG tablet Take 1 tablet (320 mg total) by mouth daily.   No facility-administered encounter medications on file as of 04/09/2015.      Review of Systems  Musculoskeletal: Positive for back pain (from cancer).       Objective:   Physical Exam  Constitutional: He is oriented to person, place, and time. No distress.  The patient is quiet and then and frail because of this treatment  he is undergoing.  HENT:  Head: Normocephalic and atraumatic.  Right Ear: External ear normal.  Left Ear: External ear normal.  Mouth/Throat: Oropharynx is clear and moist. No oropharyngeal exudate.  Nasal septal irritation right side  Eyes: Conjunctivae and EOM are normal. Pupils are equal, round, and reactive to light. Right eye exhibits no discharge. Left eye exhibits no discharge. No scleral icterus.  Neck: Normal range of motion. Neck supple. No thyromegaly present.  No anterior cervical adenopathy  Cardiovascular: Normal rate, regular rhythm and normal heart  sounds.   No murmur heard. Distal pulses were difficult to palpate The rhythm is regular at 96/m  Pulmonary/Chest: Effort normal. No respiratory distress. He has no wheezes. He has no rales.  No axillary adenopathy No breath sounds on the right side of the chest  Abdominal: Soft. Bowel sounds are normal. He exhibits no mass. There is no tenderness. There is no rebound and no guarding.  Musculoskeletal: Normal range of motion. He exhibits no edema.  Lymphadenopathy:    He has no cervical adenopathy.  Neurological: He is alert and oriented to person, place, and time.  Skin: Skin is warm and dry. No rash noted.  Psychiatric: He has a normal mood and affect. His behavior is normal. Judgment and thought content normal.  Nursing note and vitals reviewed.  BP 107/72 mmHg  Pulse 122  Temp(Src) 97.7 F (36.5 C) (Oral)  Ht 5\' 5"  (1.651 m)  Wt 105 lb (47.628 kg)  BMI 17.47 kg/m2        Assessment & Plan:  1. Type 2 diabetes mellitus without complication, without long-term current use of insulin (HCC) -Continue current treatment pending results of lab work - POCT glycosylated hemoglobin (Hb A1C)  2. Essential hypertension -Blood pressure is good today the change in treatment  3. Hyperlipidemia -Continue current treatment pending results of lab work - Lipid panel  4. COPD GOLD II -Continue follow-up with oncology  5. Vitamin D deficiency -Continue current treatment - VITAMIN D 25 Hydroxy (Vit-D Deficiency, Fractures)  6. BPH (benign prostatic hyperplasia) -No symptoms with this today.  7. Abdominal aortic atherosclerosis (HCC) -Continue with cholesterol medicine  8. Non-small cell cancer of right lung (HCC) -Continue follow-up with oncology Meds ordered this encounter  Medications  . oxyCODONE-acetaminophen (PERCOCET/ROXICET) 5-325 MG tablet    Sig: Take 1 tablet by mouth every 6 (six) hours as needed for moderate pain or severe pain.    Dispense:  30 tablet    Refill:  0       Patient Instructions                       Medicare Annual Wellness Visit  [place] and the medical providers at [facility] strive to bring you the best medical care.  In doing so we not only want to address your current medical conditions and concerns but also to detect new conditions early and prevent illness, disease and health-related problems.    Medicare offers a yearly Wellness Visit which allows our clinical staff to assess your need for preventative services including immunizations, lifestyle education, counseling to decrease risk of preventable diseases and screening for fall risk and other medical concerns.    This visit is provided free of charge (no copay) for all Medicare recipients. The clinical pharmacists at [facility] have begun to conduct these Wellness Visits which will also include a thorough review of all your medications.    As  you primary medical provider recommend that you make an appointment for your Annual Wellness Visit if you have not done so already this year.  You may set up this appointment before you leave today or you may call back ([phone]) and schedule an appointment.  Please make sure when you call that you mention that you are scheduling your Annual Wellness Visit with the clinical pharmacist so that the appointment may be made for the proper length of time.     Continue current medications. Continue good therapeutic lifestyle changes which include good diet and exercise. Fall precautions discussed with patient. If an FOBT was given today- please return it to our front desk. If you are over 43 years old - you may need Prevnar 29 or the adult Pneumonia vaccine.  **Flu shots are available--- please call and schedule a FLU-CLINIC appointment**  After your visit with [place] today you will receive a survey in the mail or online from [employer] regarding your care with [place]. Please take a moment to fill this  out. Your feedback is very important to [place] as you can help [place] better understand your patient needs as well as improve your experience and satisfaction. WE CARE ABOUT YOU!!!    continue to follow-up with oncology as planned and get the scans as planned   Arrie Senate MD

## 2015-04-09 NOTE — Patient Instructions (Addendum)
Medicare Annual Wellness Visit  [place] and the medical providers at [facility] strive to bring you the best medical care.  In doing so we not only want to address your current medical conditions and concerns but also to detect new conditions early and prevent illness, disease and health-related problems.    Medicare offers a yearly Wellness Visit which allows our clinical staff to assess your need for preventative services including immunizations, lifestyle education, counseling to decrease risk of preventable diseases and screening for fall risk and other medical concerns.    This visit is provided free of charge (no copay) for all Medicare recipients. The clinical pharmacists at [facility] have begun to conduct these Wellness Visits which will also include a thorough review of all your medications.    As you primary medical provider recommend that you make an appointment for your Annual Wellness Visit if you have not done so already this year.  You may set up this appointment before you leave today or you may call back ([phone]) and schedule an appointment.  Please make sure when you call that you mention that you are scheduling your Annual Wellness Visit with the clinical pharmacist so that the appointment may be made for the proper length of time.     Continue current medications. Continue good therapeutic lifestyle changes which include good diet and exercise. Fall precautions discussed with patient. If an FOBT was given today- please return it to our front desk. If you are over 61 years old - you may need Prevnar 56 or the adult Pneumonia vaccine.  **Flu shots are available--- please call and schedule a FLU-CLINIC appointment**  After your visit with [place] today you will receive a survey in the mail or online from [employer] regarding your care with [place]. Please take a moment to fill this out. Your feedback is very  important to [place] as you can help [place] better understand your patient needs as well as improve your experience and satisfaction. WE CARE ABOUT YOU!!!    continue to follow-up with oncology as planned and get the scans as planned

## 2015-04-10 ENCOUNTER — Other Ambulatory Visit (HOSPITAL_BASED_OUTPATIENT_CLINIC_OR_DEPARTMENT_OTHER): Payer: Medicaid Other

## 2015-04-10 ENCOUNTER — Ambulatory Visit: Payer: Self-pay | Admitting: Family Medicine

## 2015-04-10 DIAGNOSIS — C3491 Malignant neoplasm of unspecified part of right bronchus or lung: Secondary | ICD-10-CM | POA: Diagnosis not present

## 2015-04-10 LAB — CBC WITH DIFFERENTIAL/PLATELET
BASO%: 0.6 % (ref 0.0–2.0)
Basophils Absolute: 0 10*3/uL (ref 0.0–0.1)
EOS%: 0.5 % (ref 0.0–7.0)
Eosinophils Absolute: 0 10*3/uL (ref 0.0–0.5)
HCT: 27.1 % — ABNORMAL LOW (ref 38.4–49.9)
HEMOGLOBIN: 8.7 g/dL — AB (ref 13.0–17.1)
LYMPH%: 12.9 % — AB (ref 14.0–49.0)
MCH: 31 pg (ref 27.2–33.4)
MCHC: 32 g/dL (ref 32.0–36.0)
MCV: 96.8 fL (ref 79.3–98.0)
MONO#: 0.2 10*3/uL (ref 0.1–0.9)
MONO%: 12.2 % (ref 0.0–14.0)
NEUT%: 73.8 % (ref 39.0–75.0)
NEUTROS ABS: 1 10*3/uL — AB (ref 1.5–6.5)
Platelets: 16 10*3/uL — ABNORMAL LOW (ref 140–400)
RBC: 2.79 10*6/uL — AB (ref 4.20–5.82)
RDW: 19.3 % — AB (ref 11.0–14.6)
WBC: 1.4 10*3/uL — AB (ref 4.0–10.3)
lymph#: 0.2 10*3/uL — ABNORMAL LOW (ref 0.9–3.3)

## 2015-04-10 LAB — COMPREHENSIVE METABOLIC PANEL
ALT: 18 U/L (ref 0–55)
AST: 19 U/L (ref 5–34)
Albumin: 3.5 g/dL (ref 3.5–5.0)
Alkaline Phosphatase: 94 U/L (ref 40–150)
Anion Gap: 10 mEq/L (ref 3–11)
BILIRUBIN TOTAL: 0.63 mg/dL (ref 0.20–1.20)
BUN: 17.2 mg/dL (ref 7.0–26.0)
CO2: 23 meq/L (ref 22–29)
CREATININE: 1.1 mg/dL (ref 0.7–1.3)
Calcium: 9.3 mg/dL (ref 8.4–10.4)
Chloride: 103 mEq/L (ref 98–109)
EGFR: 75 mL/min/{1.73_m2} — AB (ref >90)
GLUCOSE: 154 mg/dL — AB (ref 70–140)
Potassium: 4.7 mEq/L (ref 3.5–5.1)
SODIUM: 135 meq/L — AB (ref 136–145)
TOTAL PROTEIN: 7.4 g/dL (ref 6.4–8.3)

## 2015-04-10 LAB — LIPID PANEL
CHOL/HDL RATIO: 2.2 ratio (ref 0.0–5.0)
Cholesterol, Total: 177 mg/dL (ref 100–199)
HDL: 81 mg/dL (ref >39)
LDL Calculated: 84 mg/dL (ref 0–99)
TRIGLYCERIDES: 62 mg/dL (ref 0–149)
VLDL Cholesterol Cal: 12 mg/dL (ref 5–40)

## 2015-04-10 LAB — VITAMIN D 25 HYDROXY (VIT D DEFICIENCY, FRACTURES): Vit D, 25-Hydroxy: 30.6 ng/mL (ref 30.0–100.0)

## 2015-04-10 NOTE — Progress Notes (Signed)
Quick Note:  Call patient with the result and advise bleeding precautions for the low platelets. ______

## 2015-04-11 ENCOUNTER — Telehealth: Payer: Self-pay | Admitting: Medical Oncology

## 2015-04-11 NOTE — Telephone Encounter (Signed)
Reviewed bleeding precautions-he has had some bleeding when he blows his nose. I instructed him to call us if he has prolonged nosebleeds or any other bleeding.

## 2015-04-12 ENCOUNTER — Telehealth: Payer: Self-pay

## 2015-04-12 NOTE — Telephone Encounter (Signed)
Medicaid prior authorized Brand name Diovan  [id number]

## 2015-04-17 ENCOUNTER — Ambulatory Visit (HOSPITAL_BASED_OUTPATIENT_CLINIC_OR_DEPARTMENT_OTHER): Payer: Self-pay | Admitting: Nurse Practitioner

## 2015-04-17 ENCOUNTER — Ambulatory Visit (HOSPITAL_BASED_OUTPATIENT_CLINIC_OR_DEPARTMENT_OTHER): Payer: Medicaid Other

## 2015-04-17 ENCOUNTER — Telehealth: Payer: Self-pay | Admitting: Nurse Practitioner

## 2015-04-17 ENCOUNTER — Telehealth: Payer: Self-pay | Admitting: Family Medicine

## 2015-04-17 ENCOUNTER — Encounter: Payer: Self-pay | Admitting: Nurse Practitioner

## 2015-04-17 ENCOUNTER — Ambulatory Visit: Payer: Medicaid Other

## 2015-04-17 ENCOUNTER — Ambulatory Visit (HOSPITAL_COMMUNITY)
Admission: RE | Admit: 2015-04-17 | Discharge: 2015-04-17 | Disposition: A | Discharge status: Home / Self Care | Payer: Medicaid Other | Source: Ambulatory Visit | Attending: Internal Medicine | Admitting: Internal Medicine

## 2015-04-17 ENCOUNTER — Ambulatory Visit (HOSPITAL_BASED_OUTPATIENT_CLINIC_OR_DEPARTMENT_OTHER): Payer: Medicaid Other | Admitting: Nurse Practitioner

## 2015-04-17 ENCOUNTER — Other Ambulatory Visit (HOSPITAL_BASED_OUTPATIENT_CLINIC_OR_DEPARTMENT_OTHER): Payer: Medicaid Other

## 2015-04-17 VITALS — BP 94/62 | HR 112 | Temp 97.8°F | Resp 18 | Ht 65.0 in | Wt 100.0 lb

## 2015-04-17 VITALS — BP 101/75 | HR 94 | Temp 98.3°F | Resp 18

## 2015-04-17 DIAGNOSIS — C3490 Malignant neoplasm of unspecified part of unspecified bronchus or lung: Secondary | ICD-10-CM | POA: Diagnosis present

## 2015-04-17 DIAGNOSIS — T451X5A Adverse effect of antineoplastic and immunosuppressive drugs, initial encounter: Secondary | ICD-10-CM

## 2015-04-17 DIAGNOSIS — D649 Anemia, unspecified: Secondary | ICD-10-CM | POA: Diagnosis not present

## 2015-04-17 DIAGNOSIS — R110 Nausea: Secondary | ICD-10-CM

## 2015-04-17 DIAGNOSIS — D6481 Anemia due to antineoplastic chemotherapy: Secondary | ICD-10-CM

## 2015-04-17 DIAGNOSIS — C3491 Malignant neoplasm of unspecified part of right bronchus or lung: Secondary | ICD-10-CM

## 2015-04-17 DIAGNOSIS — Z23 Encounter for immunization: Secondary | ICD-10-CM

## 2015-04-17 DIAGNOSIS — B349 Viral infection, unspecified: Secondary | ICD-10-CM | POA: Diagnosis not present

## 2015-04-17 DIAGNOSIS — I959 Hypotension, unspecified: Secondary | ICD-10-CM | POA: Diagnosis not present

## 2015-04-17 DIAGNOSIS — Z716 Tobacco abuse counseling: Secondary | ICD-10-CM

## 2015-04-17 DIAGNOSIS — R55 Syncope and collapse: Secondary | ICD-10-CM | POA: Diagnosis not present

## 2015-04-17 DIAGNOSIS — D630 Anemia in neoplastic disease: Secondary | ICD-10-CM

## 2015-04-17 DIAGNOSIS — E46 Unspecified protein-calorie malnutrition: Secondary | ICD-10-CM

## 2015-04-17 DIAGNOSIS — E8809 Other disorders of plasma-protein metabolism, not elsewhere classified: Secondary | ICD-10-CM

## 2015-04-17 DIAGNOSIS — J069 Acute upper respiratory infection, unspecified: Secondary | ICD-10-CM

## 2015-04-17 DIAGNOSIS — R634 Abnormal weight loss: Secondary | ICD-10-CM

## 2015-04-17 DIAGNOSIS — G893 Neoplasm related pain (acute) (chronic): Secondary | ICD-10-CM

## 2015-04-17 LAB — CBC WITH DIFFERENTIAL/PLATELET
BASO%: 0 % (ref 0.0–2.0)
Basophils Absolute: 0 10*3/uL (ref 0.0–0.1)
EOS%: 1.6 % (ref 0.0–7.0)
Eosinophils Absolute: 0.1 10*3/uL (ref 0.0–0.5)
HCT: 23.7 % — ABNORMAL LOW (ref 38.4–49.9)
HEMOGLOBIN: 7.8 g/dL — AB (ref 13.0–17.1)
LYMPH%: 12.9 % — AB (ref 14.0–49.0)
MCH: 32 pg (ref 27.2–33.4)
MCHC: 32.9 g/dL (ref 32.0–36.0)
MCV: 97.1 fL (ref 79.3–98.0)
MONO#: 0.4 10*3/uL (ref 0.1–0.9)
MONO%: 10.1 % (ref 0.0–14.0)
NEUT%: 75.4 % — ABNORMAL HIGH (ref 39.0–75.0)
NEUTROS ABS: 3.3 10*3/uL (ref 1.5–6.5)
Platelets: 114 10*3/uL — ABNORMAL LOW (ref 140–400)
RBC: 2.44 10*6/uL — AB (ref 4.20–5.82)
RDW: 18.9 % — ABNORMAL HIGH (ref 11.0–14.6)
WBC: 4.4 10*3/uL (ref 4.0–10.3)
lymph#: 0.6 10*3/uL — ABNORMAL LOW (ref 0.9–3.3)
nRBC: 1 % — ABNORMAL HIGH (ref 0–0)

## 2015-04-17 LAB — HOLD TUBE, BLOOD BANK

## 2015-04-17 LAB — PREPARE RBC (CROSSMATCH)

## 2015-04-17 LAB — COMPREHENSIVE METABOLIC PANEL
ALBUMIN: 2.9 g/dL — AB (ref 3.5–5.0)
ALT: <9 U/L (ref 0–55)
AST: 10 U/L (ref 5–34)
Alkaline Phosphatase: 77 U/L (ref 40–150)
Anion Gap: 10 mEq/L (ref 3–11)
BUN: 14.6 mg/dL (ref 7.0–26.0)
CO2: 23 meq/L (ref 22–29)
Calcium: 8.9 mg/dL (ref 8.4–10.4)
Chloride: 105 mEq/L (ref 98–109)
Creatinine: 1.1 mg/dL (ref 0.7–1.3)
EGFR: 72 mL/min/{1.73_m2} — AB (ref >90)
GLUCOSE: 218 mg/dL — AB (ref 70–140)
POTASSIUM: 3.6 meq/L (ref 3.5–5.1)
SODIUM: 137 meq/L (ref 136–145)
Total Bilirubin: 0.4 mg/dL (ref 0.20–1.20)
Total Protein: 6.8 g/dL (ref 6.4–8.3)

## 2015-04-17 MED ORDER — ACETAMINOPHEN 325 MG PO TABS
650.0000 mg | ORAL_TABLET | Freq: Once | ORAL | Status: AC
  Administered 2015-04-17: 650 mg via ORAL

## 2015-04-17 MED ORDER — SODIUM CHLORIDE 0.9 % IV SOLN
Freq: Once | INTRAVENOUS | Status: AC
  Administered 2015-04-17: 10:00:00 via INTRAVENOUS

## 2015-04-17 MED ORDER — DIPHENHYDRAMINE HCL 25 MG PO CAPS
ORAL_CAPSULE | ORAL | Status: AC
  Filled 2015-04-17: qty 1

## 2015-04-17 MED ORDER — SODIUM CHLORIDE 0.9 % IV SOLN
250.0000 mL | Freq: Once | INTRAVENOUS | Status: AC
  Administered 2015-04-17: 250 mL via INTRAVENOUS

## 2015-04-17 MED ORDER — ACETAMINOPHEN 325 MG PO TABS
ORAL_TABLET | ORAL | Status: AC
  Filled 2015-04-17: qty 2

## 2015-04-17 MED ORDER — DIPHENHYDRAMINE HCL 25 MG PO CAPS
25.0000 mg | ORAL_CAPSULE | Freq: Once | ORAL | Status: AC
  Administered 2015-04-17: 25 mg via ORAL

## 2015-04-17 NOTE — Patient Instructions (Addendum)
[facility] Discharge Instructions   Call the clinic if you have any more episodes of passing out or feel dizzy or light-headed.  DO NOT TAKE YOUR DIOVAN (Blood pressure medication) TONIGHT. Your blood pressure has been low today.  Follow up with your primary care doctor about your blood pressure medication.    BELOW ARE SYMPTOMS THAT SHOULD BE REPORTED IMMEDIATELY:  *FEVER GREATER THAN 100.5 F  *CHILLS WITH OR WITHOUT FEVER  NAUSEA AND VOMITING THAT IS NOT CONTROLLED WITH YOUR NAUSEA MEDICATION  *UNUSUAL SHORTNESS OF BREATH  *UNUSUAL BRUISING OR BLEEDING  TENDERNESS IN MOUTH AND THROAT WITH OR WITHOUT PRESENCE OF ULCERS  *URINARY PROBLEMS  *BOWEL PROBLEMS  UNUSUAL RASH Items with * indicate a potential emergency and should be followed up as soon as possible.  Feel free to call the clinic you have any questions or concerns. The clinic phone number is [phone].  Please show the [place] at check-in to the Emergency Department and triage nurse.   Blood Transfusion  A blood transfusion is a procedure in which you receive donated blood through an IV tube. You may need a blood transfusion because of illness, surgery, or injury. The blood may come from a donor, or it may be your own blood that you donated previously. The blood given in a transfusion is made up of different types of cells. You may receive: 9. Red blood cells. These carry oxygen and replace lost blood. 10. Platelets. These control bleeding. 11. Plasma. Thishelps blood to clot. If you have hemophilia or another clotting disorder, you may also receive other types of blood products. LET [facility] CARE PROVIDER KNOW ABOUT: 2. Any allergies you have. 3. All medicines you are taking, including vitamins, herbs, eye drops, creams, and over-the-counter medicines. 4. Previous problems you or members of your family have had with the use of anesthetics. 5. Any blood disorders you  have. 6. Previous surgeries you have had. 7. Any medical conditions you may have. 8. Any previous reactions you have had during a blood transfusion.  RISKS AND COMPLICATIONS Generally, this is a safe procedure. However, problems may occur, including:  Having an allergic reaction to something in the donated blood.  Fever. This may be a reaction to the white blood cells in the transfused blood.  Iron overload. This can happen from having many transfusions.  Transfusion-related acute lung injury (TRALI). This is a rare reaction that causes lung damage. The cause is not known.TRALI can occur within hours of a transfusion or several days later.  Sudden (acute) or delayed hemolytic reactions. This happens if your blood does not match the cells in your transfusion. Your body's defense system (immune system) may try to attack the new cells. This complication is rare.  Infection. This is rare. BEFORE THE PROCEDURE  You may have a blood test to determine your blood type. This is necessary to know what kind of blood your body will accept.  If you are going to have a planned surgery, you may donate your own blood. This may be done in case you need to have a transfusion.  If you have had an allergic reaction to a transfusion in the past, you may be given medicine to help prevent a reaction. Take this medicine only as directed by your health care provider.  You will have your temperature, blood pressure, and pulse monitored before the transfusion. PROCEDURE   An IV will be started in your hand or arm.  The bag  of donated blood will be attached to your IV tube and given into your vein.  Your temperature, blood pressure, and pulse will be monitored regularly during the transfusion. This monitoring is done to detect early signs of a transfusion reaction.  If you have any signs or symptoms of a reaction, your transfusion will be stopped and you may be given medicine.  When the transfusion is  over, your IV will be removed.  Pressure may be applied to the IV site for a few minutes.  A bandage (dressing) will be applied. The procedure may vary among health care providers and hospitals. AFTER THE PROCEDURE  Your blood pressure, temperature, and pulse will be monitored regularly.   This information is not intended to replace advice given to you by your health care provider. Make sure you discuss any questions you have with your health care provider.   Document Released: 04/04/2000 Document Revised: 04/28/2014 Document Reviewed: 02/15/2014 Elsevier Interactive Patient Education [employer].

## 2015-04-17 NOTE — Progress Notes (Signed)
[facility] [phone]   Fax:([phone]) [place], [place] [address]  DIAGNOSIS: Metastatic non-small cell lung cancer initially diagnosed as Unresectable a stage IIA (T1a, N1, M0) non-small cell lung cancer, invasive poorly differentiated carcinoma diagnosed in March 2016  PRIOR THERAPY:  1) A course of concurrent chemoradiation with weekly carboplatin for AUC of 2 and paclitaxel 45 MG/M2. 2) concurrent chemoradiation with weekly carboplatin for AUC of 2 and paclitaxel 45 mg/m2 resumed on 10/09/14. Completed his last dose on 10/24/14.  CURRENT THERAPY: Systemic chemotherapy with carboplatin for AUC of 5 on day 1 and gemcitabine 1000 MG/M2 on days 1 and 8 every 3 weeks. First dose on 12/12/2014. Status post 6 cycles.  INTERVAL HISTORY: Adam Warner 59 y.o. male returns to the clinic today for follow-up visit. This week he is recovering from a "virus" that his whole family came down with. He denies fevers or chills, but does have mucus production and sinus symptoms. The mucus is white to yellow in color. He has lost 5lb since his last visit due to a lack of appetite. He is attempting to supplement with protein shakes, but lately they have made him nauseous and vomit twice. He has increased shortness of breath. He has pain to right flank and back, managed with percocet. He is moving his bowels every other day without assistance.    MEDICAL HISTORY: Past Medical History  Diagnosis Date  . COPD (chronic obstructive pulmonary disease) ([place])   . Stroke ([place])   . Hypertension   . Heart attack ([place]) 1991  . Pulmonary nodule   . Coronary artery disease     sees Dr Spero Curb at [facility] every 2-3 years  . Shortness of breath dyspnea   . Full dentures   . Diabetes mellitus     Type Niddm x 2 years  . DNR no code (do not resuscitate) 01/23/2015  . DNR no code (do not resuscitate) 01/23/2015    ALLERGIES:  is  allergic to zyban.  MEDICATIONS:  Current Outpatient Prescriptions  Medication Sig Dispense Refill  . ACCU-CHEK AVIVA PLUS test strip USE TO CHECK ONCE OR TWICE DAILY 100 each 0  . ACCU-CHEK FASTCLIX LANCETS MISC Check blood sugar bid and prn 102 each 0  . atorvastatin (LIPITOR) 40 MG tablet TAKE ONE TABLET BY MOUTH ONE TIME DAILY 30 tablet 6  . Blood Glucose Monitoring Suppl (ONE TOUCH ULTRA 2) W/DEVICE KIT Use monitor to test blood sugar qd Dx 250.00 1 each 0  . Cholecalciferol (VITAMIN D) 2000 UNITS CAPS Take 1 capsule by mouth daily.    . metFORMIN (GLUCOPHAGE) 500 MG tablet TAKE ONE TABLET BY MOUTH TWICE A DAY WITH MEALS 60 tablet 5  . oxyCODONE-acetaminophen (PERCOCET/ROXICET) 5-325 MG tablet Take 1 tablet by mouth every 6 (six) hours as needed for moderate pain or severe pain. 30 tablet 0  . prochlorperazine (COMPAZINE) 10 MG tablet Take 1 tablet (10 mg total) by mouth every 6 (six) hours as needed for nausea or vomiting. 30 tablet 0  . tiotropium (SPIRIVA) 18 MCG inhalation capsule Place 1 capsule (18 mcg total) into inhaler and inhale daily. 30 capsule 6  . valsartan (DIOVAN) 320 MG tablet Take 1 tablet (320 mg total) by mouth daily. 30 tablet 6  . aspirin EC 325 MG tablet Take 325 mg by mouth every morning. Reported on 04/17/2015    . feeding supplement, GLUCERNA SHAKE, (  GLUCERNA SHAKE) LIQD Take 237 mLs by mouth 2 (two) times daily between meals. (Patient not taking: Reported on 04/17/2015) 60 Can 5   No current facility-administered medications for this visit.    SURGICAL HISTORY:  Past Surgical History  Procedure Laterality Date  . Lung surgery      Dr Edwyna Shell  . Angioplasty    . Colonoscopy  10/22/2011    Procedure: COLONOSCOPY;  Surgeon: Malissa Hippo, MD;  Location: [facility];  Service: Endoscopy;  Laterality: N/A;  1200  . Carotid endarterectomy Right   . Video bronchoscopy with endobronchial ultrasound N/A 05/29/2014    Procedure: VIDEO BRONCHOSCOPY WITH  ENDOBRONCHIAL ULTRASOUND;  Surgeon: Loreli Slot, MD;  Location: [facility] OR;  Service: Thoracic;  Laterality: N/A;    REVIEW OF SYSTEMS:  A comprehensive review of systems was negative except for: Constitutional: positive for anorexia, fatigue and weight loss Respiratory: positive for dyspnea on exertion Musculoskeletal: positive for back pain   PHYSICAL EXAMINATION: General appearance: alert, cooperative and no distress Head: Normocephalic, without obvious abnormality, atraumatic Neck: no adenopathy, no JVD, supple, symmetrical, trachea midline and thyroid not enlarged, symmetric, no tenderness/mass/nodules Lymph nodes: Cervical, supraclavicular, and axillary nodes normal. Resp: clear to auscultation bilaterally Back: symmetric, no curvature. ROM normal. No CVA tenderness. Cardio: regular rate and rhythm, S1, S2 normal, no murmur, click, rub or gallop GI: soft, non-tender; bowel sounds normal; no masses,  no organomegaly Extremities: extremities normal, atraumatic, no cyanosis or edema Neurologic: Alert and oriented X 3, normal strength and tone. Normal symmetric reflexes. Normal coordination and gait  ECOG PERFORMANCE STATUS: 1 - Symptomatic but completely ambulatory  Blood pressure 94/62, pulse 112, temperature 97.8 F (36.6 C), temperature source Oral, resp. rate 18, height 5\' 5"  (1.651 m), weight 100 lb (45.36 kg), SpO2 97 %.  LABORATORY DATA: Lab Results  Component Value Date   WBC 4.4 04/17/2015   HGB 7.8* 04/17/2015   HCT 23.7* 04/17/2015   MCV 97.1 04/17/2015   PLT 114* 04/17/2015      Chemistry      Component Value Date/Time   NA 135* 04/10/2015 0942   NA 136 05/23/2014 1015   NA 139 04/12/2014 1029   K 4.7 04/10/2015 0942   K 4.1 05/23/2014 1015   CL 106 05/23/2014 1015   CO2 23 04/10/2015 0942   CO2 25 05/23/2014 1015   BUN 17.2 04/10/2015 0942   BUN 10 05/23/2014 1015   BUN 12 04/12/2014 1029   CREATININE 1.1 04/10/2015 0942   CREATININE 1.07 05/23/2014  1015   CREATININE 1.05 11/04/2012 1242      Component Value Date/Time   CALCIUM 9.3 04/10/2015 0942   CALCIUM 9.3 05/23/2014 1015   ALKPHOS 94 04/10/2015 0942   ALKPHOS 90 05/23/2014 1015   AST 19 04/10/2015 0942   AST 19 05/23/2014 1015   ALT 18 04/10/2015 0942   ALT 17 05/23/2014 1015   BILITOT 0.63 04/10/2015 0942   BILITOT 0.6 05/23/2014 1015       RADIOGRAPHIC STUDIES: No results found.  ASSESSMENT AND PLAN: This is a very pleasant 59years old white male with history of stage IIA non-small cell lung cancer status post a course of concurrent chemoradiation with weekly carboplatin and paclitaxel. The recent CT scan of the chest showed interval decrease in the size of the left hilar mass and adenopathy. He is currently undergoing systemic chemotherapy with carboplatin and gemcitabine status post 6 cycles.  He was supposed to have a repeat CT  of the chest, abdomen, and pelvis by this visit, but it is not actually scheduled until Thursday.  The patient case was discussed with Dr. Julien Nordmann. He advised that we should hold treatment until the scans are completed.  Because the patient's hgb is 7.8 today, he will receive blood instead.  He will continue to use percocet PRN pain.  I advised him to use mucinex PRN for his sinus symptoms. We discussed tobacco cessation, and he has cut back significantly since the time of his diagnosis.   Shihab will return in 1 week for review of his scans with Dr. Julien Nordmann. He will also meet with the nutritionist that week to discuss his weight loss.  All questions were answered. The patient knows to call the clinic with any problems, questions or concerns. We can certainly see the patient much sooner if necessary.  Laurie Panda, NP

## 2015-04-17 NOTE — Progress Notes (Signed)
TC to [facility] to advise Dr. Laurance Flatten of pt episode today in treatment room. Advised Dr. Tawanna Sat office pt will be holding Diovan tonight.

## 2015-04-17 NOTE — Telephone Encounter (Signed)
Appointments made and avs printed for patient °

## 2015-04-17 NOTE — Progress Notes (Signed)
1015: Patient became unresponsive to verbal stimuli for approximately 15 seconds, slumped over in chair, respirations even and unlabored, patient immediately regained consciousness with arms flailing.  Patient A/O x4, vital signs stable.  Patient reports he had turned his head back to look at the clock and had passed out. States he often passes out when he needs blood transfusions.  Selena Lesser, NP at chairside to assess patient.  500 ml NS bolus order received verbally per NP.  Will continue to monitor patient closely.  Patient states he did not eat breakfast, currently drinking orange juice and eating graham crackers.   1105: Selena Lesser, NP notified of patient's BP 76/50 and HR 101 after receiving 500 ml NS bolus. Pt remains A/O x4, denies any s/s of dizziness or lightheadedness.  Pt states he did not take any BP medication this morning, states he usually takes Diovan in the evening.  Instructed patient to hold diovan this evening per Selena Lesser, NP.  Verbal order received to continue infusing NS at 250 ml/hr until blood is ready and to give tylenol and benadryl 30 minutes prior to blood.   1550: Okay to discharge patient home per Selena Lesser, NP.  Vital signs stable, pt remains A/O x4.  Instructed patient to hold his Diovan tonight and to follow up with his PCP regarding taking his BP med. Pt verbalized understanding.

## 2015-04-18 ENCOUNTER — Encounter: Payer: Self-pay | Admitting: Nurse Practitioner

## 2015-04-18 DIAGNOSIS — D6481 Anemia due to antineoplastic chemotherapy: Secondary | ICD-10-CM | POA: Insufficient documentation

## 2015-04-18 DIAGNOSIS — T451X5A Adverse effect of antineoplastic and immunosuppressive drugs, initial encounter: Secondary | ICD-10-CM

## 2015-04-18 DIAGNOSIS — E46 Unspecified protein-calorie malnutrition: Secondary | ICD-10-CM | POA: Insufficient documentation

## 2015-04-18 DIAGNOSIS — E8809 Other disorders of plasma-protein metabolism, not elsewhere classified: Secondary | ICD-10-CM | POA: Insufficient documentation

## 2015-04-18 LAB — TYPE AND SCREEN
ABO/RH(D): O POS
ANTIBODY SCREEN: NEGATIVE
UNIT DIVISION: 0
Unit division: 0

## 2015-04-18 NOTE — Assessment & Plan Note (Signed)
A BUN has decreased from 3.5 down to 2.9.  Patient reports decreased appetite; and has lost approximately 5 pounds since his last weight check.  Patient was encouraged to eat multiple small meals throughout the day and to increase his protein.

## 2015-04-18 NOTE — Progress Notes (Signed)
SYMPTOM MANAGEMENT CLINIC   HPI: Adam Warner 59 y.o. male diagnosed with lung cancer; with bone metastasis.  Currently undergoing carboplatin/gemcitabine chemotherapy regimen.  Patient was seen earlier today by Dr. Julien Nordmann and [place] nurse practitioner.   Patient presented to the [facility] today to receive a blood transfusion for symptomatic anemia with hemoglobin 7.8.  Patient was in the infusion center chair awaiting his blood transfusion; when he had a full syncopal episode.  He awoke after just a few seconds; and stated that he had no other symptoms whatsoever.  Blood pressure directly following the syncopal event was 104/64.  Patient denied any chest pain, chest pressure, shortness breath, or pain with inspiration.  He denies any vision change or headache.  He also denies any numbness or tingling.  Patient was afebrile.  Patient states he has a history of syncopal events when he is anemic.  Blood pressure on recheck dropped down to 76/50.  Patient states he has a history of hypertension; and continues taking Diovan every evening.  Patient was given a fluid bolus of approximate 750 ML's prior to his blood transfusion today.  Patient Spears, no further syncopal events.  Blood pressure improved to 101/75.  Prior to discharge today.  Patient was advised to hold his Diovan.  Blood pressure medication this evening.  Also, advised the patient called his primary care provider first thing in the morning to discuss his syncopal event and possible need to either decrease or hold blood pressure medication altogether.  Also, cancer Center nurse called to inform patient's permit her provider of syncopal event as well.  Gave recommendation to consider changing/adjusting or discontinue patient's blood pressure medication.  Advised patient to call/return of her directly to the emergency department for any worsening symptoms whatsoever.  HPI  ROS  Past Medical History  Diagnosis Date    . COPD (chronic obstructive pulmonary disease) ([place])   . Stroke ([place])   . Hypertension   . Heart attack ([place]) 1991  . Pulmonary nodule   . Coronary artery disease     sees Dr Spero Curb at [facility] every 2-3 years  . Shortness of breath dyspnea   . Full dentures   . Diabetes mellitus     Type Niddm x 2 years  . DNR no code (do not resuscitate) 01/23/2015  . DNR no code (do not resuscitate) 01/23/2015    Past Surgical History  Procedure Laterality Date  . Lung surgery      Dr Arlyce Dice  . Angioplasty    . Colonoscopy  10/22/2011    Procedure: COLONOSCOPY;  Surgeon: Rogene Houston, MD;  Location[facility];  Service: Endoscopy;  Laterality: N/A;  1200  . Carotid endarterectomy Right   . Video bronchoscopy with endobronchial ultrasound N/A 05/29/2014    Procedure: VIDEO BRONCHOSCOPY WITH ENDOBRONCHIAL ULTRASOUND;  Surgeon: Melrose Nakayama, MD;  Location: [facility];  Service: Thoracic;  Laterality: N/A;    has Diabetes ([place]); Hyperlipidemia; SMOKER; Essential hypertension; CAD; CAD, NATIVE VESSEL; CAROTID ARTERY STENOSIS, WITHOUT INFARCTION; TIA; C V A / STROKE; COPD GOLD II; LEG PAIN; Syncope and collapse; Pulmonary nodule; ABNORMAL CV (STRESS) TEST; ABNORMAL LUNG XRAY; FH: lung cancer; Non-small cell carcinoma of lung, stage 2 ([place]); Encounter for antineoplastic chemotherapy; Metastasis to bone [facility]); DNR no code (do not resuscitate); DNR no code (do not resuscitate); Hypoalbuminemia due to protein-calorie malnutrition ([place]); and Antineoplastic chemotherapy induced anemia on his problem list.    is allergic to zyban.  Medication List       This list is accurate as of: 04/17/15 11:59 PM.  Always use your most recent med list.               ACCU-CHEK AVIVA PLUS test strip  Generic drug:  glucose blood  USE TO CHECK ONCE OR TWICE DAILY     ACCU-CHEK FASTCLIX LANCETS Misc  Check blood sugar bid and prn     aspirin EC 325 MG tablet  Take 325 mg by mouth every morning.  Reported on 04/17/2015     atorvastatin 40 MG tablet  Commonly known as:  LIPITOR  TAKE ONE TABLET BY MOUTH ONE TIME DAILY     feeding supplement (GLUCERNA SHAKE) Liqd  Take 237 mLs by mouth 2 (two) times daily between meals.     metFORMIN 500 MG tablet  Commonly known as:  GLUCOPHAGE  TAKE ONE TABLET BY MOUTH TWICE A DAY WITH MEALS     ONE TOUCH ULTRA 2 w/Device Kit  Use monitor to test blood sugar qd Dx 250.00     oxyCODONE-acetaminophen 5-325 MG tablet  Commonly known as:  PERCOCET/ROXICET  Take 1 tablet by mouth every 6 (six) hours as needed for moderate pain or severe pain.     prochlorperazine 10 MG tablet  Commonly known as:  COMPAZINE  Take 1 tablet (10 mg total) by mouth every 6 (six) hours as needed for nausea or vomiting.     tiotropium 18 MCG inhalation capsule  Commonly known as:  SPIRIVA  Place 1 capsule (18 mcg total) into inhaler and inhale daily.     valsartan 320 MG tablet  Commonly known as:  DIOVAN  Take 1 tablet (320 mg total) by mouth daily.     Vitamin D 2000 units Caps  Take 1 capsule by mouth daily.         PHYSICAL EXAMINATION  Oncology Vitals 04/17/2015 04/17/2015  Height - -  Weight - -  Weight (lbs) - -  BMI (kg/m2) - -  Temp 98.3 98  Pulse 94 85  Resp 18 18  SpO2 100 100  BSA (m2) - -   BP Readings from Last 2 Encounters:  04/17/15 101/75  04/17/15 94/62    Physical Exam  Constitutional: He is oriented to person, place, and time. He appears malnourished and dehydrated. He appears unhealthy. He appears cachectic.  HENT:  Head: Normocephalic and atraumatic.  Eyes: Conjunctivae and EOM are normal. Pupils are equal, round, and reactive to light. Right eye exhibits no discharge. Left eye exhibits no discharge. No scleral icterus.  Neck: Normal range of motion.  Pulmonary/Chest: Effort normal. No respiratory distress.  Musculoskeletal: Normal range of motion.  Neurological: He is alert and oriented to person, place, and time.   Psychiatric: Affect normal.  Nursing note and vitals reviewed.   LABORATORY DATA:. Hospital Outpatient Visit on 04/17/2015  Component Date Value Ref Range Status  . Order Confirmation 04/17/2015 ORDER PROCESSED BY BLOOD BANK   Final  . ABO/RH(D) 04/17/2015 O POS   Final  . Antibody Screen 04/17/2015 NEG   Final  . Sample Expiration 04/17/2015 04/20/2015   Final  . Unit Number 04/17/2015 [id number]   Final  . Blood Component Type 04/17/2015 RED CELLS,LR   Final  . Unit division 04/17/2015 00   Final  . Status of Unit 04/17/2015 ISSUED,FINAL   Final  . Transfusion Status 04/17/2015 OK TO TRANSFUSE   Final  . Crossmatch Result 04/17/2015 Compatible  Final  . Unit Number 04/17/2015 [id number]   Final  . Blood Component Type 04/17/2015 RED CELLS,LR   Final  . Unit division 04/17/2015 00   Final  . Status of Unit 04/17/2015 ISSUED,FINAL   Final  . Transfusion Status 04/17/2015 OK TO TRANSFUSE   Final  . Crossmatch Result 04/17/2015 Compatible   Final  Appointment on 04/17/2015  Component Date Value Ref Range Status  . Hold Tube, Blood Bank 04/17/2015 Type and Crossmatch Added   Final  Appointment on 04/17/2015  Component Date Value Ref Range Status  . WBC 04/17/2015 4.4  4.0 - 10.3 10e3/uL Final  . NEUT# 04/17/2015 3.3  1.5 - 6.5 10e3/uL Final  . HGB 04/17/2015 7.8* 13.0 - 17.1 g/dL Final  . HCT 04/17/2015 23.7* 38.4 - 49.9 % Final  . Platelets 04/17/2015 114* 140 - 400 10e3/uL Final  . MCV 04/17/2015 97.1  79.3 - 98.0 fL Final  . MCH 04/17/2015 32.0  27.2 - 33.4 pg Final  . MCHC 04/17/2015 32.9  32.0 - 36.0 g/dL Final  . RBC 04/17/2015 2.44* 4.20 - 5.82 10e6/uL Final  . RDW 04/17/2015 18.9* 11.0 - 14.6 % Final  . lymph# 04/17/2015 0.6* 0.9 - 3.3 10e3/uL Final  . MONO# 04/17/2015 0.4  0.1 - 0.9 10e3/uL Final  . Eosinophils Absolute 04/17/2015 0.1  0.0 - 0.5 10e3/uL Final  . Basophils Absolute 04/17/2015 0.0  0.0 - 0.1 10e3/uL Final  . NEUT% 04/17/2015 75.4* 39.0 - 75.0 %  Final  . LYMPH% 04/17/2015 12.9* 14.0 - 49.0 % Final  . MONO% 04/17/2015 10.1  0.0 - 14.0 % Final  . EOS% 04/17/2015 1.6  0.0 - 7.0 % Final  . BASO% 04/17/2015 0.0  0.0 - 2.0 % Final  . nRBC 04/17/2015 1* 0 - 0 % Final  . Sodium 04/17/2015 137  136 - 145 mEq/L Final  . Potassium 04/17/2015 3.6  3.5 - 5.1 mEq/L Final  . Chloride 04/17/2015 105  98 - 109 mEq/L Final  . CO2 04/17/2015 23  22 - 29 mEq/L Final  . Glucose 04/17/2015 218* 70 - 140 mg/dl Final   Glucose reference range is for nonfasting patients. Fasting glucose reference range is 70- 100.  Marland Kitchen BUN 04/17/2015 14.6  7.0 - 26.0 mg/dL Final  . Creatinine 04/17/2015 1.1  0.7 - 1.3 mg/dL Final  . Total Bilirubin 04/17/2015 0.40  0.20 - 1.20 mg/dL Final  . Alkaline Phosphatase 04/17/2015 77  40 - 150 U/L Final  . AST 04/17/2015 10  5 - 34 U/L Final  . ALT 04/17/2015 <9  0 - 55 U/L Final  . Total Protein 04/17/2015 6.8  6.4 - 8.3 g/dL Final  . Albumin 04/17/2015 2.9* 3.5 - 5.0 g/dL Final  . Calcium 04/17/2015 8.9  8.4 - 10.4 mg/dL Final  . Anion Gap 04/17/2015 10  3 - 11 mEq/L Final  . EGFR 04/17/2015 72* >90 ml/min/1.73 m2 Final   eGFR is calculated using the CKD-EPI Creatinine Equation (2009)     RADIOGRAPHIC STUDIES: No results found.  ASSESSMENT/PLAN:    Syncope and collapse Patient presented to the [facility] today to receive a blood transfusion for symptomatic anemia with hemoglobin 7.8.  Patient was in the infusion center chair awaiting his blood transfusion; when he had a full syncopal episode.  He awoke after just a few seconds; and stated that he had no other symptoms whatsoever.  Blood pressure directly following the syncopal event was 104/64.  Patient denied any chest pain,  chest pressure, shortness breath, or pain with inspiration.  He denies any vision change or headache.  He also denies any numbness or tingling.  Patient was afebrile.  Patient states he has a history of syncopal events when he is anemic.  Blood  pressure on recheck dropped down to 76/50.  Patient states he has a history of hypertension; and continues taking Diovan every evening.  Patient was given a fluid bolus of approximate 750 ML's prior to his blood transfusion today.  Patient Spears, no further syncopal events.  Blood pressure improved to 101/75.  Prior to discharge today.  Patient was advised to hold his Diovan.  Blood pressure medication this evening.  Also, advised the patient called his primary care provider first thing in the morning to discuss his syncopal event and possible need to either decrease or hold blood pressure medication altogether.  Also, cancer Center nurse called to inform patient's permit her provider of syncopal event as well.  Gave recommendation to consider changing/adjusting or discontinue patient's blood pressure medication.  Advised patient to call/return of her directly to the emergency department for any worsening symptoms whatsoever.  Non-small cell carcinoma of lung, stage 2 ([place]) Patient received cycle 6 of his carboplatin/gemcitabine chemotherapy on 03/27/2015.  Patient was scheduled to receive cycle 7 of his chemotherapy today; but his restaging scans have not yet been performed.  Patient is scheduled for CT of the chest/abdomen/pelvis on 2 separate 29th 2016.  Patient will return on 04/24/2015 for labs, visit to review scan results, and his next cycle of chemotherapy.  Hypoalbuminemia due to protein-calorie malnutrition ([facility]) A BUN has decreased from 3.5 down to 2.9.  Patient reports decreased appetite; and has lost approximately 5 pounds since his last weight check.  Patient was encouraged to eat multiple small meals throughout the day and to increase his protein.  Antineoplastic chemotherapy induced anemia Chemotherapy therapy-induced anemia with hemoglobin 7.8.  Patient complains of increased fatigue; but denies any shortness of breath with exertion.  Patient will receive 2 units packed red blood  cells today.  See note regarding syncopal event.  Patient stated understanding of all instructions; and was in agreement with this plan of care. The patient knows to call the clinic with any problems, questions or concerns.   Review/collaboration with Dr. Julien Nordmann regarding all aspects of patient's visit today.    Disclaimer:This dictation was prepared with Dragon/digital dictation along with [employer]. Any transcriptional errors that result from this process are unintentional.  Drue Second, NP 04/18/2015

## 2015-04-18 NOTE — Assessment & Plan Note (Signed)
Patient presented to the [facility] today to receive a blood transfusion for symptomatic anemia with hemoglobin 7.8.  Patient was in the infusion center chair awaiting his blood transfusion; when he had a full syncopal episode.  He awoke after just a few seconds; and stated that he had no other symptoms whatsoever.  Blood pressure directly following the syncopal event was 104/64.  Patient denied any chest pain, chest pressure, shortness breath, or pain with inspiration.  He denies any vision change or headache.  He also denies any numbness or tingling.  Patient was afebrile.  Patient states he has a history of syncopal events when he is anemic.  Blood pressure on recheck dropped down to 76/50.  Patient states he has a history of hypertension; and continues taking Diovan every evening.  Patient was given a fluid bolus of approximate 750 ML's prior to his blood transfusion today.  Patient Spears, no further syncopal events.  Blood pressure improved to 101/75.  Prior to discharge today.  Patient was advised to hold his Diovan.  Blood pressure medication this evening.  Also, advised the patient called his primary care provider first thing in the morning to discuss his syncopal event and possible need to either decrease or hold blood pressure medication altogether.  Also, cancer Center nurse called to inform patient's permit her provider of syncopal event as well.  Gave recommendation to consider changing/adjusting or discontinue patient's blood pressure medication.  Advised patient to call/return of her directly to the emergency department for any worsening symptoms whatsoever.

## 2015-04-18 NOTE — Assessment & Plan Note (Signed)
Patient received cycle 6 of his carboplatin/gemcitabine chemotherapy on 03/27/2015.  Patient was scheduled to receive cycle 7 of his chemotherapy today; but his restaging scans have not yet been performed.  Patient is scheduled for CT of the chest/abdomen/pelvis on 2 separate 29th 2016.  Patient will return on 04/24/2015 for labs, visit to review scan results, and his next cycle of chemotherapy.

## 2015-04-18 NOTE — Assessment & Plan Note (Signed)
Chemotherapy therapy-induced anemia with hemoglobin 7.8.  Patient complains of increased fatigue; but denies any shortness of breath with exertion.  Patient will receive 2 units packed red blood cells today.  See note regarding syncopal event.

## 2015-04-19 ENCOUNTER — Other Ambulatory Visit (HOSPITAL_COMMUNITY): Payer: Medicaid Other

## 2015-04-19 ENCOUNTER — Ambulatory Visit (HOSPITAL_COMMUNITY)
Admission: RE | Admit: 2015-04-19 | Discharge: 2015-04-19 | Disposition: A | Discharge status: Home / Self Care | Payer: Medicaid Other | Source: Ambulatory Visit | Attending: Internal Medicine | Admitting: Internal Medicine

## 2015-04-19 DIAGNOSIS — I2510 Atherosclerotic heart disease of native coronary artery without angina pectoris: Secondary | ICD-10-CM | POA: Insufficient documentation

## 2015-04-19 DIAGNOSIS — R110 Nausea: Secondary | ICD-10-CM

## 2015-04-19 DIAGNOSIS — M8448XA Pathological fracture, other site, initial encounter for fracture: Secondary | ICD-10-CM | POA: Diagnosis not present

## 2015-04-19 DIAGNOSIS — K769 Liver disease, unspecified: Secondary | ICD-10-CM | POA: Diagnosis not present

## 2015-04-19 DIAGNOSIS — Z23 Encounter for immunization: Secondary | ICD-10-CM

## 2015-04-19 DIAGNOSIS — Z923 Personal history of irradiation: Secondary | ICD-10-CM | POA: Diagnosis not present

## 2015-04-19 DIAGNOSIS — R918 Other nonspecific abnormal finding of lung field: Secondary | ICD-10-CM | POA: Diagnosis not present

## 2015-04-19 DIAGNOSIS — J90 Pleural effusion, not elsewhere classified: Secondary | ICD-10-CM | POA: Diagnosis not present

## 2015-04-19 DIAGNOSIS — C3491 Malignant neoplasm of unspecified part of right bronchus or lung: Secondary | ICD-10-CM | POA: Insufficient documentation

## 2015-04-19 DIAGNOSIS — E279 Disorder of adrenal gland, unspecified: Secondary | ICD-10-CM | POA: Diagnosis not present

## 2015-04-19 DIAGNOSIS — J439 Emphysema, unspecified: Secondary | ICD-10-CM | POA: Diagnosis not present

## 2015-04-19 DIAGNOSIS — T451X5A Adverse effect of antineoplastic and immunosuppressive drugs, initial encounter: Secondary | ICD-10-CM

## 2015-04-19 DIAGNOSIS — I313 Pericardial effusion (noninflammatory): Secondary | ICD-10-CM | POA: Diagnosis not present

## 2015-04-19 DIAGNOSIS — N200 Calculus of kidney: Secondary | ICD-10-CM | POA: Insufficient documentation

## 2015-04-19 DIAGNOSIS — I700 Atherosclerosis of aorta: Secondary | ICD-10-CM | POA: Diagnosis not present

## 2015-04-19 MED ORDER — IOHEXOL 300 MG/ML  SOLN
150.0000 mL | Freq: Once | INTRAMUSCULAR | Status: AC | PRN
  Administered 2015-04-19: 80 mL via INTRAVENOUS

## 2015-04-24 ENCOUNTER — Ambulatory Visit (HOSPITAL_BASED_OUTPATIENT_CLINIC_OR_DEPARTMENT_OTHER): Payer: Medicaid Other | Admitting: Nurse Practitioner

## 2015-04-24 ENCOUNTER — Other Ambulatory Visit (HOSPITAL_BASED_OUTPATIENT_CLINIC_OR_DEPARTMENT_OTHER): Payer: Medicaid Other

## 2015-04-24 ENCOUNTER — Other Ambulatory Visit: Payer: Medicaid Other

## 2015-04-24 ENCOUNTER — Encounter: Payer: Self-pay | Admitting: Nurse Practitioner

## 2015-04-24 ENCOUNTER — Ambulatory Visit (HOSPITAL_BASED_OUTPATIENT_CLINIC_OR_DEPARTMENT_OTHER): Payer: Medicaid Other

## 2015-04-24 ENCOUNTER — Telehealth: Payer: Self-pay | Admitting: *Deleted

## 2015-04-24 ENCOUNTER — Other Ambulatory Visit: Payer: Medicaid Other | Admitting: *Deleted

## 2015-04-24 ENCOUNTER — Ambulatory Visit: Payer: Medicaid Other | Admitting: Nutrition

## 2015-04-24 VITALS — BP 103/70 | HR 115 | Temp 98.6°F | Resp 17 | Ht 65.0 in | Wt 103.1 lb

## 2015-04-24 DIAGNOSIS — I959 Hypotension, unspecified: Secondary | ICD-10-CM | POA: Diagnosis not present

## 2015-04-24 DIAGNOSIS — E8809 Other disorders of plasma-protein metabolism, not elsewhere classified: Secondary | ICD-10-CM

## 2015-04-24 DIAGNOSIS — Z5111 Encounter for antineoplastic chemotherapy: Secondary | ICD-10-CM

## 2015-04-24 DIAGNOSIS — J90 Pleural effusion, not elsewhere classified: Secondary | ICD-10-CM

## 2015-04-24 DIAGNOSIS — C7951 Secondary malignant neoplasm of bone: Secondary | ICD-10-CM

## 2015-04-24 DIAGNOSIS — R55 Syncope and collapse: Secondary | ICD-10-CM

## 2015-04-24 DIAGNOSIS — E46 Unspecified protein-calorie malnutrition: Secondary | ICD-10-CM

## 2015-04-24 DIAGNOSIS — C3490 Malignant neoplasm of unspecified part of unspecified bronchus or lung: Secondary | ICD-10-CM

## 2015-04-24 DIAGNOSIS — C3491 Malignant neoplasm of unspecified part of right bronchus or lung: Secondary | ICD-10-CM

## 2015-04-24 LAB — CBC WITH DIFFERENTIAL/PLATELET
BASO%: 0.5 % (ref 0.0–2.0)
Basophils Absolute: 0 10*3/uL (ref 0.0–0.1)
EOS%: 1.2 % (ref 0.0–7.0)
Eosinophils Absolute: 0.1 10*3/uL (ref 0.0–0.5)
HEMATOCRIT: 36.2 % — AB (ref 38.4–49.9)
HEMOGLOBIN: 12 g/dL — AB (ref 13.0–17.1)
LYMPH#: 0.4 10*3/uL — AB (ref 0.9–3.3)
LYMPH%: 7.4 % — ABNORMAL LOW (ref 14.0–49.0)
MCH: 31.7 pg (ref 27.2–33.4)
MCHC: 33.3 g/dL (ref 32.0–36.0)
MCV: 95 fL (ref 79.3–98.0)
MONO#: 0.5 10*3/uL (ref 0.1–0.9)
MONO%: 10.1 % (ref 0.0–14.0)
NEUT%: 80.8 % — ABNORMAL HIGH (ref 39.0–75.0)
NEUTROS ABS: 4.2 10*3/uL (ref 1.5–6.5)
PLATELETS: 232 10*3/uL (ref 140–400)
RBC: 3.8 10*6/uL — AB (ref 4.20–5.82)
RDW: 18.6 % — AB (ref 11.0–14.6)
WBC: 5.2 10*3/uL (ref 4.0–10.3)

## 2015-04-24 LAB — COMPREHENSIVE METABOLIC PANEL
ALBUMIN: 3 g/dL — AB (ref 3.5–5.0)
ANION GAP: 8 meq/L (ref 3–11)
AST: 11 U/L (ref 5–34)
Alkaline Phosphatase: 90 U/L (ref 40–150)
BILIRUBIN TOTAL: 0.45 mg/dL (ref 0.20–1.20)
BUN: 13.3 mg/dL (ref 7.0–26.0)
CALCIUM: 8.9 mg/dL (ref 8.4–10.4)
CO2: 25 meq/L (ref 22–29)
CREATININE: 1 mg/dL (ref 0.7–1.3)
Chloride: 104 mEq/L (ref 98–109)
EGFR: 86 mL/min/{1.73_m2} — ABNORMAL LOW (ref >90)
Glucose: 324 mg/dl — ABNORMAL HIGH (ref 70–140)
Potassium: 4.1 mEq/L (ref 3.5–5.1)
Sodium: 137 mEq/L (ref 136–145)
TOTAL PROTEIN: 6.9 g/dL (ref 6.4–8.3)

## 2015-04-24 MED ORDER — CARBOPLATIN CHEMO INTRADERMAL TEST DOSE 100MCG/0.02ML
100.0000 ug | Freq: Once | INTRADERMAL | Status: AC
  Administered 2015-04-24: 100 ug via INTRADERMAL
  Filled 2015-04-24: qty 0.01

## 2015-04-24 MED ORDER — SODIUM CHLORIDE 0.9 % IV SOLN
800.0000 mg/m2 | Freq: Once | INTRAVENOUS | Status: AC
  Administered 2015-04-24: 1216 mg via INTRAVENOUS
  Filled 2015-04-24: qty 31.98

## 2015-04-24 MED ORDER — FAMOTIDINE IN NACL 20-0.9 MG/50ML-% IV SOLN
INTRAVENOUS | Status: AC
  Filled 2015-04-24: qty 50

## 2015-04-24 MED ORDER — SODIUM CHLORIDE 0.9 % IV SOLN
Freq: Once | INTRAVENOUS | Status: AC
  Administered 2015-04-24: 15:00:00 via INTRAVENOUS

## 2015-04-24 MED ORDER — DIPHENHYDRAMINE HCL 50 MG/ML IJ SOLN
25.0000 mg | Freq: Once | INTRAMUSCULAR | Status: AC
Start: 2015-04-24 — End: 2015-04-24
  Administered 2015-04-24: 25 mg via INTRAVENOUS

## 2015-04-24 MED ORDER — DEXAMETHASONE SODIUM PHOSPHATE 100 MG/10ML IJ SOLN
Freq: Once | INTRAMUSCULAR | Status: AC
  Administered 2015-04-24: 15:00:00 via INTRAVENOUS
  Filled 2015-04-24: qty 8

## 2015-04-24 MED ORDER — FAMOTIDINE IN NACL 20-0.9 MG/50ML-% IV SOLN
20.0000 mg | Freq: Once | INTRAVENOUS | Status: AC
  Administered 2015-04-24: 20 mg via INTRAVENOUS

## 2015-04-24 MED ORDER — DIPHENHYDRAMINE HCL 50 MG/ML IJ SOLN
INTRAMUSCULAR | Status: AC
  Filled 2015-04-24: qty 1

## 2015-04-24 MED ORDER — SODIUM CHLORIDE 0.9 % IV SOLN
302.8000 mg | Freq: Once | INTRAVENOUS | Status: AC
  Administered 2015-04-24: 300 mg via INTRAVENOUS
  Filled 2015-04-24: qty 30

## 2015-04-24 NOTE — Progress Notes (Signed)
Brief nutrition follow-up with patient during infusion. Weight documented as 103.1 pounds January 3. Decreased from 106.1 pounds December 6. Patient denies nutrition impact symptoms however does endorse recent weight loss.  Nutrition diagnosis: Unintended weight loss continues.  Intervention: I enforced the importance of patient increasing oral nutrition supplements. Provided additional samples. Teach back method used.  Monitoring, evaluation, goals: Patient will increase overall intake of meals and snacks to minimize further weight loss.  Next visit: To be scheduled as needed.  **Disclaimer: This note was dictated with voice recognition software. Similar sounding words can inadvertently be transcribed and this note may contain transcription errors which may not have been corrected upon publication of note.**

## 2015-04-24 NOTE — Telephone Encounter (Signed)
The patient should leave off his Diovan until his blood pressure comes up. He should drop by the office in a couple days and we'll see what the blood pressure is at that time

## 2015-04-24 NOTE — Telephone Encounter (Signed)
TC to [facility]- Message left for Dr. Laurance Flatten pt is continuing to hold Diovan medication until further instruction from PCP. Pt BP in office today was 103/70. Jasmine took message.

## 2015-04-24 NOTE — Progress Notes (Signed)
Carboplatin skin test given R inner upper forearm.  Neg at 5 ", 15" & 30 ".  Site unremarkable.  @ 1510, zofran infused & line flushing & noted pt scratching above IV site.  Noted swollen, hard, red area above IV site but able to get blood return from IV.  Site ? Infiltrated but also looked like a giant welp.  Had pharmacist & Selena Lesser observe.  The [place] skin test was a good distance away from IV site.  Cyndee suggested benadryl & pepcid & to see if this goes away.  IV restarted in Left forearem.

## 2015-04-24 NOTE — Assessment & Plan Note (Signed)
Albumen continues low at 3.0.  Patient was encouraged to push protein in his diet is much as possible.

## 2015-04-24 NOTE — Assessment & Plan Note (Signed)
Restaging CT obtained last week revealed a right pleural effusion.  Patient will be scheduled for a therapeutic thoracentesis on 04/29/2014 at 10:15 AM at [facility].

## 2015-04-24 NOTE — Progress Notes (Signed)
SYMPTOM MANAGEMENT CLINIC   HPI: Adam Warner 60 y.o. male diagnosed with lung cancer and bone metastasis.  Currently undergoing Carboplatin/gemcitabine chemotherapy regimen.   Patient presents to the [facility] today to receive cycle 7, day 1 of his chemotherapy regimen.  He states he's had no further syncopal episodes since he received his blood transfusion last week.  He continues to complain of some chronic, mild cough and occasional shortness of breath only.  He denies any chest pain, chest pressure, or pain with inspiration.  He denies any recent fevers or chills.  HPI  ROS  Past Medical History  Diagnosis Date  . COPD (chronic obstructive pulmonary disease) ([place])   . Stroke ([place])   . Hypertension   . Heart attack ([place]) 1991  . Pulmonary nodule   . Coronary artery disease     sees Dr Spero Curb at [facility] every 2-3 years  . Shortness of breath dyspnea   . Full dentures   . Diabetes mellitus     Type Niddm x 2 years  . DNR no code (do not resuscitate) 01/23/2015  . DNR no code (do not resuscitate) 01/23/2015    Past Surgical History  Procedure Laterality Date  . Lung surgery      Dr Arlyce Dice  . Angioplasty    . Colonoscopy  10/22/2011    Procedure: COLONOSCOPY;  Surgeon: Rogene Houston, MD;  [facility];  Service: Endoscopy;  Laterality: N/A;  1200  . Carotid endarterectomy Right   . Video bronchoscopy with endobronchial ultrasound N/A 05/29/2014    Procedure: VIDEO BRONCHOSCOPY WITH ENDOBRONCHIAL ULTRASOUND;  Surgeon: Melrose Nakayama, MD;  Location: [facility];  Service: Thoracic;  Laterality: N/A;    has Diabetes ([place]); Hyperlipidemia; SMOKER; Essential hypertension; CAD; CAD, NATIVE VESSEL; CAROTID ARTERY STENOSIS, WITHOUT INFARCTION; TIA; C V A / STROKE; COPD GOLD II; LEG PAIN; Syncope and collapse; Pulmonary nodule; ABNORMAL CV (STRESS) TEST; ABNORMAL LUNG XRAY; FH: lung cancer; Non-small cell carcinoma of lung, stage 2 ([place]); Encounter for  antineoplastic chemotherapy; Metastasis to bone [facility]); DNR no code (do not resuscitate); DNR no code (do not resuscitate); Hypoalbuminemia due to protein-calorie malnutrition ([place]); Hypotension; and Pleural effusion on right on his problem list.    is allergic to zyban.    Medication List       This list is accurate as of: 04/24/15  5:01 PM.  Always use your most recent med list.               ACCU-CHEK AVIVA PLUS test strip  Generic drug:  glucose blood  USE TO CHECK ONCE OR TWICE DAILY     ACCU-CHEK FASTCLIX LANCETS Misc  Check blood sugar bid and prn     aspirin EC 325 MG tablet  Take 325 mg by mouth every morning. Reported on 04/17/2015     atorvastatin 40 MG tablet  Commonly known as:  LIPITOR  TAKE ONE TABLET BY MOUTH ONE TIME DAILY     feeding supplement (GLUCERNA SHAKE) Liqd  Take 237 mLs by mouth 2 (two) times daily between meals.     metFORMIN 500 MG tablet  Commonly known as:  GLUCOPHAGE  TAKE ONE TABLET BY MOUTH TWICE A DAY WITH MEALS     ONE TOUCH ULTRA 2 w/Device Kit  Use monitor to test blood sugar qd Dx 250.00     oxyCODONE-acetaminophen 5-325 MG tablet  Commonly known as:  PERCOCET/ROXICET  Take 1 tablet by mouth every 6 (six) hours  as needed for moderate pain or severe pain.     prochlorperazine 10 MG tablet  Commonly known as:  COMPAZINE  Take 1 tablet (10 mg total) by mouth every 6 (six) hours as needed for nausea or vomiting.     tiotropium 18 MCG inhalation capsule  Commonly known as:  SPIRIVA  Place 1 capsule (18 mcg total) into inhaler and inhale daily.     valsartan 320 MG tablet  Commonly known as:  DIOVAN  Take 1 tablet (320 mg total) by mouth daily.     Vitamin D 2000 units Caps  Take 1 capsule by mouth daily.         PHYSICAL EXAMINATION  Oncology Vitals 04/24/2015 04/17/2015  Height 165 cm -  Weight 46.766 kg -  Weight (lbs) 103 lbs 2 oz -  BMI (kg/m2) 17.16 kg/m2 -  Temp 98.6 98.3  Pulse 115 94  Resp 17 18  SpO2 98 100    BSA (m2) 1.47 m2 -   BP Readings from Last 2 Encounters:  04/24/15 103/70  04/17/15 101/75    Physical Exam  Constitutional: He is oriented to person, place, and time. He appears malnourished. He appears unhealthy. He appears cachectic.  HENT:  Head: Normocephalic and atraumatic.  Mouth/Throat: Oropharynx is clear and moist.  Eyes: Conjunctivae and EOM are normal. Pupils are equal, round, and reactive to light. Right eye exhibits no discharge. Left eye exhibits no discharge. No scleral icterus.  Neck: Normal range of motion. Neck supple. No JVD present. No tracheal deviation present. No thyromegaly present.  Cardiovascular: Regular rhythm, normal heart sounds and intact distal pulses.   Tachycardic  Pulmonary/Chest: Effort normal and breath sounds normal. No respiratory distress. He has no wheezes. He has no rales. He exhibits no tenderness.  Abdominal: Soft. Bowel sounds are normal. He exhibits no distension and no mass. There is no tenderness. There is no rebound and no guarding.  Musculoskeletal: Normal range of motion. He exhibits no edema or tenderness.  Lymphadenopathy:    He has no cervical adenopathy.  Neurological: He is alert and oriented to person, place, and time. Gait normal.  Skin: Skin is warm and dry. No rash noted. No erythema. No pallor.  Psychiatric: Affect normal.  Nursing note and vitals reviewed.   LABORATORY DATA:. Appointment on 04/24/2015  Component Date Value Ref Range Status  . WBC 04/24/2015 5.2  4.0 - 10.3 10e3/uL Final  . NEUT# 04/24/2015 4.2  1.5 - 6.5 10e3/uL Final  . HGB 04/24/2015 12.0* 13.0 - 17.1 g/dL Final  . HCT 04/24/2015 36.2* 38.4 - 49.9 % Final  . Platelets 04/24/2015 232  140 - 400 10e3/uL Final  . MCV 04/24/2015 95.0  79.3 - 98.0 fL Final  . MCH 04/24/2015 31.7  27.2 - 33.4 pg Final  . MCHC 04/24/2015 33.3  32.0 - 36.0 g/dL Final  . RBC 04/24/2015 3.80* 4.20 - 5.82 10e6/uL Final  . RDW 04/24/2015 18.6* 11.0 - 14.6 % Final  . lymph#  04/24/2015 0.4* 0.9 - 3.3 10e3/uL Final  . MONO# 04/24/2015 0.5  0.1 - 0.9 10e3/uL Final  . Eosinophils Absolute 04/24/2015 0.1  0.0 - 0.5 10e3/uL Final  . Basophils Absolute 04/24/2015 0.0  0.0 - 0.1 10e3/uL Final  . NEUT% 04/24/2015 80.8* 39.0 - 75.0 % Final  . LYMPH% 04/24/2015 7.4* 14.0 - 49.0 % Final  . MONO% 04/24/2015 10.1  0.0 - 14.0 % Final  . EOS% 04/24/2015 1.2  0.0 - 7.0 % Final  .  BASO% 04/24/2015 0.5  0.0 - 2.0 % Final  . Sodium 04/24/2015 137  136 - 145 mEq/L Final  . Potassium 04/24/2015 4.1  3.5 - 5.1 mEq/L Final  . Chloride 04/24/2015 104  98 - 109 mEq/L Final  . CO2 04/24/2015 25  22 - 29 mEq/L Final  . Glucose 04/24/2015 324* 70 - 140 mg/dl Final   Glucose reference range is for nonfasting patients. Fasting glucose reference range is 70- 100.  Marland Kitchen BUN 04/24/2015 13.3  7.0 - 26.0 mg/dL Final  . Creatinine 78/42/9277 1.0  0.7 - 1.3 mg/dL Final  . Total Bilirubin 04/24/2015 0.45  0.20 - 1.20 mg/dL Final  . Alkaline Phosphatase 04/24/2015 90  40 - 150 U/L Final  . AST 04/24/2015 11  5 - 34 U/L Final  . ALT 04/24/2015 <9  0 - 55 U/L Final  . Total Protein 04/24/2015 6.9  6.4 - 8.3 g/dL Final  . Albumin 26/24/2091 3.0* 3.5 - 5.0 g/dL Final  . Calcium 15/35/1482 8.9  8.4 - 10.4 mg/dL Final  . Anion Gap 59/83/7889 8  3 - 11 mEq/L Final  . EGFR 04/24/2015 86* >90 ml/min/1.73 m2 Final   eGFR is calculated using the CKD-EPI Creatinine Equation (2009)     RADIOGRAPHIC STUDIES: No results found.  ASSESSMENT/PLAN:    Syncope and collapse Patient was in the midst of receiving a blood transfusion last week; and experienced a full syncopal episode.  Patient states he does frequently experience syncopal episodes when he is anemic.  He states he has had no further syncopal episodes whatsoever.  Hemoglobin has improved from 7.8 up to 12.0.    Non-small cell carcinoma of lung, stage 2 (HCC) Patient presented to the cancer Center today to receive cycle 7, day 1 of his  carboplatin/gemcitabine chemotherapy regimen.  Patient states that he's feeling fairly well; with the exception of a chronic cough and some occasional shortness of breath.  He denies any chest pain, chest pressure, or pain with inspiration.  Blood counts obtained today reveal a WBC of 5.2, ANC 4.2, hemoglobin 12.0, platelet count 232.  Vital signs are stable and patient is afebrile.  Restaging CT obtained last week revealed: IMPRESSION: 1. Improved right hilar node, currently 1.5 cm in short axis, previously 2.0 cm. Essentially stable right upper paratracheal lymph node. 2. Stable appearance of the pathologic fracture at T11. 3. There is a new 1.4 by 0.9 cm and nodule along the apex of the right adrenal gland, possibly an adrenal metastatic lesion or new lymph node, the this was not present on 02/12/2015. 4. Large right pleural effusion, slightly progressive from prior. Moderate pericardial effusion, mildly progressive from prior. 5. 1.2 by 0.9 cm new density along the right major fissure on image 24 series 4, probably some fluid in the fissure adjacent to a small bulla, but conceivably a cavitary nodule- this merits careful observation. 6. Scattered tiny hypodense hepatic lesions, likely tiny cysts or biliary hamartomas, although technically nonspecific and worthy of observation. 7. Other imaging findings of potential clinical significance: Coronary, aortic arch, and branch vessel atherosclerotic vascular disease. Aortoiliac atherosclerotic vascular disease. Emphysema. UIP. Bilateral nonobstructive nephrolithiasis.  Patient will be scheduled to obtain a right therapeutic thoracentesis next week for right pleural effusion.  Per CT scan.  Patient will need to be scheduled for labs and cycle 7, day 8 of his chemotherapy on 05/01/2015.  Patient will be scheduled for labs, visit, and cycle 8, day 1 of the same chemotherapy regimen on 05/15/2015.  Hypoalbuminemia due to protein-calorie  malnutrition ([place]) Albumen continues low at 3.0.  Patient was encouraged to push protein in his diet is much as possible.  Hypotension Patient's blood pressure was 103/70 today.  Patient continues to hold his Diovan.  Blood pressure medication.  Once again advised patient to follow-up with his primary care provider regarding his blood pressure medication.  Also, cancer Center nurse called to inform his primary care provider of patient's blood pressure as well.  Pleural effusion on right Restaging CT obtained last week revealed a right pleural effusion.  Patient will be scheduled for a therapeutic thoracentesis on 04/29/2014 at 10:15 AM at [facility].   Pt had just received the Zofran and dexamethasone premedications prior to his chemotherapy via peripheral IV to the right forearm; when he developed some very mild erythema and what appeared to be one large hives.  Saline infusion was held; and patient was given Benadryl 25 mg and Pepcid 20 mg IV via a left forearm peripheral IV.  Right peripheral IV was discontinued and hives.  Area marked.  All erythema did resolve; and the swelling is slowly resolving as well.  Advised patient to try Benadryl 25 mg's every 6 hours and Pepcid 20 g every 12 hours for any further allergic/hypersensitivity symptoms.  Also, advised patient to go directly to the emergency department overnight.  If he develops any worsening symptoms whatsoever.  Patient stated understanding of all instructions; and was in agreement with this plan of care. The patient knows to call the clinic with any problems, questions or concerns.   This was a shared visit with Dr. Julien Nordmann today.  Total time spent with patient was 40 minutes;  with greater than 75 percent of that time spent in face to face counseling regarding patient's symptoms,  and coordination of care and follow up.  Disclaimer:This dictation was prepared with Dragon/digital dictation along with [employer].  Any transcriptional errors that result from this process are unintentional.  Drue Second, NP 04/24/2015   ADDENDUM: Hematology/Oncology Attending: I had a face to face encounter with the patient today. I recommended his care plan. This is a very pleasant 60 years old white male with metastatic non-small cell lung cancer, squamous cell carcinoma who is currently undergoing systemic chemotherapy with carboplatin and gemcitabine status post 6 cycles. The patient has been tolerating his treatment well except for mild nausea and shortness breath with exertion. His recent CT scan of the chest, abdomen and pelvis showed improvement in the right hilar node as well as a stable right upper paratracheal lymph node but there was a new 1.4 cm right adrenal gland nodule suspicious for metastasis in addition to large right pleural effusion. I discussed the scan results with the patient and his sister. I recommended for him to continue his current treatment with carboplatin and gemcitabine for now. I will continue to monitor the right adrenal gland closely on upcoming scan. For the right pleural effusion, I will arrange for the patient to undergo ultrasound guided right sided thoracentesis in the next few days. The patient would come back for follow-up visit in 3 weeks for reevaluation before starting the next cycle of his treatment. He was advised to call immediately if he has any concerning symptoms in the interval.  Disclaimer: This note was dictated with voice recognition software. Similar sounding words can inadvertently be transcribed and may be missed upon review.  Eilleen Kempf., MD 04/24/2015

## 2015-04-24 NOTE — Assessment & Plan Note (Signed)
Patient was in the midst of receiving a blood transfusion last week; and experienced a full syncopal episode.  Patient states he does frequently experience syncopal episodes when he is anemic.  He states he has had no further syncopal episodes whatsoever.  Hemoglobin has improved from 7.8 up to 12.0.

## 2015-04-24 NOTE — Patient Instructions (Signed)
[facility] Discharge Instructions for Patients Receiving Chemotherapy  Today you received the following chemotherapy agents:  Gemzar, Carboplatin  To help prevent nausea and vomiting after your treatment, we encourage you to take your nausea medication.  If you develop nausea and vomiting that is not controlled by your nausea medication, call the clinic.   BELOW ARE SYMPTOMS THAT SHOULD BE REPORTED IMMEDIATELY:  *FEVER GREATER THAN 100.5 F  *CHILLS WITH OR WITHOUT FEVER  NAUSEA AND VOMITING THAT IS NOT CONTROLLED WITH YOUR NAUSEA MEDICATION  *UNUSUAL SHORTNESS OF BREATH  *UNUSUAL BRUISING OR BLEEDING  TENDERNESS IN MOUTH AND THROAT WITH OR WITHOUT PRESENCE OF ULCERS  *URINARY PROBLEMS  *BOWEL PROBLEMS  UNUSUAL RASH Items with * indicate a potential emergency and should be followed up as soon as possible.  Feel free to call the clinic you have any questions or concerns. The clinic phone number is [phone].  Please show the [facility] at check-in to the Emergency Department and triage nurse.

## 2015-04-24 NOTE — Assessment & Plan Note (Signed)
Patient presented to the [facility] today to receive cycle 7, day 1 of his carboplatin/gemcitabine chemotherapy regimen.  Patient states that he's feeling fairly well; with the exception of a chronic cough and some occasional shortness of breath.  He denies any chest pain, chest pressure, or pain with inspiration.  Blood counts obtained today reveal a WBC of 5.2, ANC 4.2, hemoglobin 12.0, platelet count 232.  Vital signs are stable and patient is afebrile.  Restaging CT obtained last week revealed: IMPRESSION: 1. Improved right hilar node, currently 1.5 cm in short axis, previously 2.0 cm. Essentially stable right upper paratracheal lymph node. 2. Stable appearance of the pathologic fracture at T11. 3. There is a new 1.4 by 0.9 cm and nodule along the apex of the right adrenal gland, possibly an adrenal metastatic lesion or new lymph node, the this was not present on 02/12/2015. 4. Large right pleural effusion, slightly progressive from prior. Moderate pericardial effusion, mildly progressive from prior. 5. 1.2 by 0.9 cm new density along the right major fissure on image 24 series 4, probably some fluid in the fissure adjacent to a small bulla, but conceivably a cavitary nodule- this merits careful observation. 6. Scattered tiny hypodense hepatic lesions, likely tiny cysts or biliary hamartomas, although technically nonspecific and worthy of observation. 7. Other imaging findings of potential clinical significance: Coronary, aortic arch, and branch vessel atherosclerotic vascular disease. Aortoiliac atherosclerotic vascular disease. Emphysema. UIP. Bilateral nonobstructive nephrolithiasis.  Patient will be scheduled to obtain a right therapeutic thoracentesis next week for right pleural effusion.  Per CT scan.  Patient will need to be scheduled for labs and cycle 7, day 8 of his chemotherapy on 05/01/2015.  Patient will be scheduled for labs, visit, and cycle 8, day 1 of the same  chemotherapy regimen on 05/15/2015.

## 2015-04-24 NOTE — Telephone Encounter (Signed)
Spoke with patient's niece, patient is not at home right now, he is at his chemotherapy appointment.  Patient's last blood pressure was 101/75 on 12/27, [place] advised him not to take his Diovan until he had spoken to Dr. Laurance Flatten.  Please advise of recommendation.

## 2015-04-24 NOTE — Assessment & Plan Note (Signed)
Patient's blood pressure was 103/70 today.  Patient continues to hold his Diovan.  Blood pressure medication.  Once again advised patient to follow-up with his primary care provider regarding his blood pressure medication.  Also, cancer Center nurse called to inform his primary care provider of patient's blood pressure as well.

## 2015-04-24 NOTE — Telephone Encounter (Signed)
Pt aware to cont to hold med and bring in readings in 2 weeks - per [facility]

## 2015-04-25 ENCOUNTER — Telehealth: Payer: Self-pay | Admitting: Nurse Practitioner

## 2015-04-25 NOTE — Telephone Encounter (Signed)
Pt care giver aware to stay off meds until further notice

## 2015-04-25 NOTE — Telephone Encounter (Signed)
Called patient and he is aware of his 1/10 and 1/24 appointments

## 2015-04-30 ENCOUNTER — Ambulatory Visit (HOSPITAL_COMMUNITY)
Admission: RE | Admit: 2015-04-30 | Discharge: 2015-04-30 | Disposition: A | Discharge status: Home / Self Care | Payer: Medicaid Other | Source: Ambulatory Visit | Attending: Nurse Practitioner | Admitting: Nurse Practitioner

## 2015-04-30 DIAGNOSIS — J90 Pleural effusion, not elsewhere classified: Secondary | ICD-10-CM | POA: Insufficient documentation

## 2015-04-30 DIAGNOSIS — Z9889 Other specified postprocedural states: Secondary | ICD-10-CM | POA: Insufficient documentation

## 2015-04-30 DIAGNOSIS — C3491 Malignant neoplasm of unspecified part of right bronchus or lung: Secondary | ICD-10-CM | POA: Diagnosis not present

## 2015-04-30 NOTE — Procedures (Signed)
PreOperative Dx: Non small cell RIGHT lung cancer, RIGHT pleural effusion Postoperative Dx: Non small cell RIGHT lung cancer, RIGHT pleural effusion Procedure:   US guided RIGHT thoracentesis Radiologist:  Thornton Papas Anesthesia:  7 ml of 1% lidocaine Specimen:  5000 ml of yellow colored fluid EBL:   < 1 ml Complications: None

## 2015-04-30 NOTE — Progress Notes (Signed)
Thoracentesis complete no signs of distress. 500 ml yellow colored pleural fluid removed.

## 2015-05-01 ENCOUNTER — Other Ambulatory Visit (HOSPITAL_BASED_OUTPATIENT_CLINIC_OR_DEPARTMENT_OTHER): Payer: Medicaid Other

## 2015-05-01 ENCOUNTER — Ambulatory Visit (HOSPITAL_BASED_OUTPATIENT_CLINIC_OR_DEPARTMENT_OTHER): Payer: Medicaid Other

## 2015-05-01 VITALS — BP 129/81 | HR 116 | Temp 97.4°F | Resp 20

## 2015-05-01 DIAGNOSIS — C3490 Malignant neoplasm of unspecified part of unspecified bronchus or lung: Secondary | ICD-10-CM

## 2015-05-01 DIAGNOSIS — Z5111 Encounter for antineoplastic chemotherapy: Secondary | ICD-10-CM | POA: Diagnosis not present

## 2015-05-01 LAB — COMPREHENSIVE METABOLIC PANEL
ALT: 13 U/L (ref 0–55)
AST: 14 U/L (ref 5–34)
Albumin: 3.1 g/dL — ABNORMAL LOW (ref 3.5–5.0)
Alkaline Phosphatase: 115 U/L (ref 40–150)
Anion Gap: 10 mEq/L (ref 3–11)
BUN: 17.1 mg/dL (ref 7.0–26.0)
CHLORIDE: 102 meq/L (ref 98–109)
CO2: 24 meq/L (ref 22–29)
CREATININE: 0.9 mg/dL (ref 0.7–1.3)
Calcium: 8.9 mg/dL (ref 8.4–10.4)
EGFR: 88 mL/min/{1.73_m2} — ABNORMAL LOW (ref >90)
GLUCOSE: 234 mg/dL — AB (ref 70–140)
POTASSIUM: 3.9 meq/L (ref 3.5–5.1)
SODIUM: 135 meq/L — AB (ref 136–145)
Total Bilirubin: 0.61 mg/dL (ref 0.20–1.20)
Total Protein: 7.3 g/dL (ref 6.4–8.3)

## 2015-05-01 LAB — CBC WITH DIFFERENTIAL/PLATELET
BASO%: 0.4 % (ref 0.0–2.0)
Basophils Absolute: 0 10*3/uL (ref 0.0–0.1)
EOS%: 0.4 % (ref 0.0–7.0)
Eosinophils Absolute: 0 10*3/uL (ref 0.0–0.5)
HEMATOCRIT: 37 % — AB (ref 38.4–49.9)
HGB: 12.2 g/dL — ABNORMAL LOW (ref 13.0–17.1)
LYMPH#: 0.3 10*3/uL — AB (ref 0.9–3.3)
LYMPH%: 7.2 % — AB (ref 14.0–49.0)
MCH: 31 pg (ref 27.2–33.4)
MCHC: 32.8 g/dL (ref 32.0–36.0)
MCV: 94.5 fL (ref 79.3–98.0)
MONO#: 0.3 10*3/uL (ref 0.1–0.9)
MONO%: 5.4 % (ref 0.0–14.0)
NEUT#: 4.1 10*3/uL (ref 1.5–6.5)
NEUT%: 86.6 % — AB (ref 39.0–75.0)
Platelets: 163 10*3/uL (ref 140–400)
RBC: 3.92 10*6/uL — AB (ref 4.20–5.82)
RDW: 17 % — ABNORMAL HIGH (ref 11.0–14.6)
WBC: 4.8 10*3/uL (ref 4.0–10.3)

## 2015-05-01 MED ORDER — SODIUM CHLORIDE 0.9 % IV SOLN
Freq: Once | INTRAVENOUS | Status: AC
  Administered 2015-05-01: 13:00:00 via INTRAVENOUS

## 2015-05-01 MED ORDER — SODIUM CHLORIDE 0.9 % IV SOLN
800.0000 mg/m2 | Freq: Once | INTRAVENOUS | Status: AC
  Administered 2015-05-01: 1216 mg via INTRAVENOUS
  Filled 2015-05-01: qty 31.98

## 2015-05-01 MED ORDER — PROCHLORPERAZINE MALEATE 10 MG PO TABS
10.0000 mg | ORAL_TABLET | Freq: Once | ORAL | Status: AC
  Administered 2015-05-01: 10 mg via ORAL

## 2015-05-01 MED ORDER — PROCHLORPERAZINE MALEATE 10 MG PO TABS
ORAL_TABLET | ORAL | Status: AC
  Filled 2015-05-01: qty 1

## 2015-05-14 ENCOUNTER — Telehealth: Payer: Self-pay | Admitting: Pharmacist

## 2015-05-14 NOTE — Telephone Encounter (Signed)
Patient callled about compliance with atorvastatin (73%) and Diovan (64%).  Patient states that he has been told to hold Diovan due to hypotension.  This medication was removed from patient's list.  He states that he is taking atorvastatin regularly.

## 2015-05-15 ENCOUNTER — Encounter: Payer: Self-pay | Admitting: Internal Medicine

## 2015-05-15 ENCOUNTER — Telehealth: Payer: Self-pay | Admitting: *Deleted

## 2015-05-15 ENCOUNTER — Ambulatory Visit: Payer: Medicaid Other

## 2015-05-15 ENCOUNTER — Ambulatory Visit (HOSPITAL_BASED_OUTPATIENT_CLINIC_OR_DEPARTMENT_OTHER): Payer: Medicaid Other | Admitting: Internal Medicine

## 2015-05-15 ENCOUNTER — Other Ambulatory Visit (HOSPITAL_BASED_OUTPATIENT_CLINIC_OR_DEPARTMENT_OTHER): Payer: Medicaid Other

## 2015-05-15 VITALS — BP 103/70 | HR 115 | Temp 98.1°F | Resp 18 | Ht 65.0 in | Wt 101.4 lb

## 2015-05-15 DIAGNOSIS — R630 Anorexia: Secondary | ICD-10-CM

## 2015-05-15 DIAGNOSIS — C3491 Malignant neoplasm of unspecified part of right bronchus or lung: Secondary | ICD-10-CM

## 2015-05-15 DIAGNOSIS — C3490 Malignant neoplasm of unspecified part of unspecified bronchus or lung: Secondary | ICD-10-CM

## 2015-05-15 DIAGNOSIS — E8809 Other disorders of plasma-protein metabolism, not elsewhere classified: Secondary | ICD-10-CM

## 2015-05-15 DIAGNOSIS — E279 Disorder of adrenal gland, unspecified: Secondary | ICD-10-CM

## 2015-05-15 DIAGNOSIS — R634 Abnormal weight loss: Secondary | ICD-10-CM | POA: Diagnosis not present

## 2015-05-15 DIAGNOSIS — E46 Unspecified protein-calorie malnutrition: Secondary | ICD-10-CM

## 2015-05-15 DIAGNOSIS — J90 Pleural effusion, not elsewhere classified: Secondary | ICD-10-CM | POA: Diagnosis not present

## 2015-05-15 DIAGNOSIS — C7951 Secondary malignant neoplasm of bone: Secondary | ICD-10-CM

## 2015-05-15 DIAGNOSIS — R5383 Other fatigue: Secondary | ICD-10-CM | POA: Diagnosis not present

## 2015-05-15 LAB — CBC WITH DIFFERENTIAL/PLATELET
BASO%: 0.5 % (ref 0.0–2.0)
BASOS ABS: 0 10*3/uL (ref 0.0–0.1)
EOS ABS: 0 10*3/uL (ref 0.0–0.5)
EOS%: 1 % (ref 0.0–7.0)
HEMATOCRIT: 32.5 % — AB (ref 38.4–49.9)
HEMOGLOBIN: 10.8 g/dL — AB (ref 13.0–17.1)
LYMPH#: 0.3 10*3/uL — AB (ref 0.9–3.3)
LYMPH%: 10.6 % — ABNORMAL LOW (ref 14.0–49.0)
MCH: 31.5 pg (ref 27.2–33.4)
MCHC: 33.1 g/dL (ref 32.0–36.0)
MCV: 95.4 fL (ref 79.3–98.0)
MONO#: 0.6 10*3/uL (ref 0.1–0.9)
MONO%: 19.5 % — ABNORMAL HIGH (ref 0.0–14.0)
NEUT%: 68.4 % (ref 39.0–75.0)
NEUTROS ABS: 2.1 10*3/uL (ref 1.5–6.5)
Platelets: 92 10*3/uL — ABNORMAL LOW (ref 140–400)
RBC: 3.41 10*6/uL — ABNORMAL LOW (ref 4.20–5.82)
RDW: 17.7 % — AB (ref 11.0–14.6)
WBC: 3.1 10*3/uL — AB (ref 4.0–10.3)

## 2015-05-15 LAB — COMPREHENSIVE METABOLIC PANEL
ALBUMIN: 3 g/dL — AB (ref 3.5–5.0)
ALK PHOS: 95 U/L (ref 40–150)
ALT: <9 U/L (ref 0–55)
AST: 14 U/L (ref 5–34)
Anion Gap: 9 mEq/L (ref 3–11)
BILIRUBIN TOTAL: 0.41 mg/dL (ref 0.20–1.20)
BUN: 13.4 mg/dL (ref 7.0–26.0)
CALCIUM: 8.8 mg/dL (ref 8.4–10.4)
CO2: 24 mEq/L (ref 22–29)
CREATININE: 1 mg/dL (ref 0.7–1.3)
Chloride: 105 mEq/L (ref 98–109)
EGFR: 85 mL/min/{1.73_m2} — ABNORMAL LOW (ref >90)
GLUCOSE: 177 mg/dL — AB (ref 70–140)
POTASSIUM: 4.1 meq/L (ref 3.5–5.1)
Sodium: 137 mEq/L (ref 136–145)
TOTAL PROTEIN: 7.2 g/dL (ref 6.4–8.3)

## 2015-05-15 NOTE — Progress Notes (Signed)
[place] [phone]   Fax:([phone]) [place], [place] [address]  DIAGNOSIS: Metastatic non-small cell lung cancer initially diagnosed as Unresectable a stage IIA (T1a, N1, M0) non-small cell lung cancer, invasive poorly differentiated carcinoma diagnosed in March 2016  PRIOR THERAPY:  1) A course of concurrent chemoradiation with weekly carboplatin for AUC of 2 and paclitaxel 45 MG/M2. 2) concurrent chemoradiation with weekly carboplatin for AUC of 2 and paclitaxel 45 mg/m2 resumed on 10/09/14. Completed his last dose on 10/24/14. 3) Systemic chemotherapy with carboplatin for AUC of 5 on day 1 and gemcitabine 1000 MG/M2 on days 1 and 8 every 3 weeks. First dose on 12/12/2014. Status post 7 cycles. Last dose was given 04/24/2015. This was discontinued secondary to disease progression  CURRENT THERAPY: Nivolumab 240 MG IV every 2 weeks, first dose 05/22/2015.  INTERVAL HISTORY: Adam Warner 60 y.o. male returns to the clinic today for follow-up visit. The patient continues to complain of fatigue and weakness as well as lack of appetite and weight loss. He also continues to have low back pain. He had repeat imaging studies performed recently that showed new lesion in the right adrenal gland. There was also progressive large right-sided pleural effusion. He underwent ultrasound-guided thoracentesis with drainage of 500 mL of pleural fluid. He denied having any significant chest pain, but has shortness breath with exertion with no cough or hemoptysis. He denied having any significant nausea or vomiting, no fever or chills. He is here today for evaluation and discussion of his treatment options.  MEDICAL HISTORY: Past Medical History  Diagnosis Date  . COPD (chronic obstructive pulmonary disease) ([facility])   . Stroke ([place])   . Hypertension   . Heart attack ([place]) 1991  . Pulmonary nodule   . Coronary artery  disease     sees Dr Spero Curb at [facility] every 2-3 years  . Shortness of breath dyspnea   . Full dentures   . Diabetes mellitus     Type Niddm x 2 years  . DNR no code (do not resuscitate) 01/23/2015  . DNR no code (do not resuscitate) 01/23/2015    ALLERGIES:  is allergic to zyban.  MEDICATIONS:  Current Outpatient Prescriptions  Medication Sig Dispense Refill  . ACCU-CHEK AVIVA PLUS test strip USE TO CHECK ONCE OR TWICE DAILY 100 each 0  . ACCU-CHEK FASTCLIX LANCETS MISC Check blood sugar bid and prn 102 each 0  . aspirin EC 325 MG tablet Take 325 mg by mouth every morning. Reported on 04/17/2015    . atorvastatin (LIPITOR) 40 MG tablet TAKE ONE TABLET BY MOUTH ONE TIME DAILY 30 tablet 6  . Blood Glucose Monitoring Suppl (ONE TOUCH ULTRA 2) W/DEVICE KIT Use monitor to test blood sugar qd Dx 250.00 1 each 0  . Cholecalciferol (VITAMIN D) 2000 UNITS CAPS Take 1 capsule by mouth daily.    . feeding supplement, GLUCERNA SHAKE, (GLUCERNA SHAKE) LIQD Take 237 mLs by mouth 2 (two) times daily between meals. 60 Can 5  . metFORMIN (GLUCOPHAGE) 500 MG tablet TAKE ONE TABLET BY MOUTH TWICE A DAY WITH MEALS 60 tablet 5  . OVER THE COUNTER MEDICATION Take 1 tablet by mouth daily as needed.    Marland Kitchen oxyCODONE-acetaminophen (PERCOCET/ROXICET) 5-325 MG tablet Take 1 tablet by mouth every 6 (six) hours as needed for moderate pain or severe pain. 30 tablet 0  . tiotropium (SPIRIVA) 18 MCG  inhalation capsule Place 1 capsule (18 mcg total) into inhaler and inhale daily. 30 capsule 6  . prochlorperazine (COMPAZINE) 10 MG tablet Take 1 tablet (10 mg total) by mouth every 6 (six) hours as needed for nausea or vomiting. (Patient not taking: Reported on 05/15/2015) 30 tablet 0   No current facility-administered medications for this visit.    SURGICAL HISTORY:  Past Surgical History  Procedure Laterality Date  . Lung surgery      Dr Arlyce Dice  . Angioplasty    . Colonoscopy  10/22/2011    Procedure: COLONOSCOPY;   Surgeon: Rogene Houston, MD;  Location: [facility];  Service: Endoscopy;  Laterality: N/A;  1200  . Carotid endarterectomy Right   . Video bronchoscopy with endobronchial ultrasound N/A 05/29/2014    Procedure: VIDEO BRONCHOSCOPY WITH ENDOBRONCHIAL ULTRASOUND;  Surgeon: Melrose Nakayama, MD;  Location: [facility];  Service: Thoracic;  Laterality: N/A;    REVIEW OF SYSTEMS:  Constitutional: positive for anorexia and fatigue Eyes: negative Ears, nose, mouth, throat, and face: negative Respiratory: positive for cough and dyspnea on exertion Cardiovascular: negative Gastrointestinal: negative Genitourinary:negative Integument/breast: negative Hematologic/lymphatic: negative Musculoskeletal:positive for back pain Neurological: negative Behavioral/Psych: negative Endocrine: negative Allergic/Immunologic: negative   PHYSICAL EXAMINATION: General appearance: alert, cooperative and no distress Head: Normocephalic, without obvious abnormality, atraumatic Neck: no adenopathy, no JVD, supple, symmetrical, trachea midline and thyroid not enlarged, symmetric, no tenderness/mass/nodules Lymph nodes: Cervical, supraclavicular, and axillary nodes normal. Resp: clear to auscultation bilaterally Back: symmetric, no curvature. ROM normal. No CVA tenderness. Cardio: regular rate and rhythm, S1, S2 normal, no murmur, click, rub or gallop GI: soft, non-tender; bowel sounds normal; no masses,  no organomegaly Extremities: extremities normal, atraumatic, no cyanosis or edema Neurologic: Alert and oriented X 3, normal strength and tone. Normal symmetric reflexes. Normal coordination and gait  ECOG PERFORMANCE STATUS: 1 - Symptomatic but completely ambulatory  Blood pressure 103/70, pulse 115, temperature 98.1 F (36.7 C), temperature source Oral, resp. rate 18, height _0  (1.651 m), weight 101 lb 6.4 oz (45.995 kg), SpO2 97 %.  LABORATORY DATA: Lab Results  Component Value Date   WBC 3.1* 05/15/2015    HGB 10.8* 05/15/2015   HCT 32.5* 05/15/2015   MCV 95.4 05/15/2015   PLT 92* 05/15/2015      Chemistry      Component Value Date/Time   NA 135* 05/01/2015 1200   NA 136 05/23/2014 1015   NA 139 04/12/2014 1029   K 3.9 05/01/2015 1200   K 4.1 05/23/2014 1015   CL 106 05/23/2014 1015   CO2 24 05/01/2015 1200   CO2 25 05/23/2014 1015   BUN 17.1 05/01/2015 1200   BUN 10 05/23/2014 1015   BUN 12 04/12/2014 1029   CREATININE 0.9 05/01/2015 1200   CREATININE 1.07 05/23/2014 1015   CREATININE 1.05 11/04/2012 1242      Component Value Date/Time   CALCIUM 8.9 05/01/2015 1200   CALCIUM 9.3 05/23/2014 1015   ALKPHOS 115 05/01/2015 1200   ALKPHOS 90 05/23/2014 1015   AST 14 05/01/2015 1200   AST 19 05/23/2014 1015   ALT 13 05/01/2015 1200   ALT 17 05/23/2014 1015   BILITOT 0.61 05/01/2015 1200   BILITOT 0.6 05/23/2014 1015       RADIOGRAPHIC STUDIES: Dg Chest 1 View  04/30/2015  CLINICAL DATA:  Lung cancer, RIGHT pleural effusion post thoracentesis EXAM: CHEST 1 VIEW COMPARISON:  CT chest 04/19/2015 FINDINGS: Enlargement of cardiac silhouette. Mediastinal contours and pulmonary  vascularity normal. Postsurgical changes of the LEFT lung with multiple staple lines as well as a surgical clip at the LEFT hilum. Surgical clips noted at the LEFT supraclavicular region as well. Chronic opacity in the RIGHT upper lobe unchanged. No acute infiltrate, pleural effusion or pneumothorax. Bones demineralized. IMPRESSION: No pneumothorax post RIGHT thoracentesis. Chronic lung disease changes and postsurgical changes of the LEFT lung has previously noted. Patient asymptomatic post RIGHT thoracentesis. Findings of this radiograph discussed with patient. Electronically Signed   By: Lavonia Dana M.D.   On: 04/30/2015 12:00   Ct Chest W Contrast  04/19/2015  CLINICAL DATA:  Non-small cell right-sided lung cancer originally diagnosed in 2012, recurrence/metastatic disease in 2016. Radiation therapy  completed. EXAM: CT CHEST, ABDOMEN, AND PELVIS WITH CONTRAST TECHNIQUE: Multidetector CT imaging of the chest, abdomen and pelvis was performed following the standard protocol during bolus administration of intravenous contrast. CONTRAST:  52m OMNIPAQUE IOHEXOL 300 MG/ML  SOLN COMPARISON:  Multiple exams, including 02/12/2015 FINDINGS: CT CHEST FINDINGS Mediastinum/Nodes: Right upper paratracheal node 0.8 cm short axis image 5 series 2, previously 0.7 cm. Indistinctly marginated right hilar nodal soft tissue density, 1.5 cm in short axis on image 25 series 2, previously 2.0 cm. Moderate pericardial effusion thickness along the cardiac apex, mildly increased from prior. Coronary, aortic arch, and branch vessel atherosclerotic vascular disease. Lungs/Pleura: Large right pleural effusion, somewhat increased in size from prior. Given the anterior pleural irregularity in the right chest for example on image 24 series 2, I favor an exudative effusion although there is no overt enhancement along posterior portions of the effusion margin. Emphysema is present. Interstitial accentuation in the lungs especially peripherally favoring UIP. There is some airway plugging in the left lower lobe. Passive atelectasis in the right lung. There is potentially some early honeycombing in the right lung. Pleural thickening anteriorly in the right mid thorax. 5 mm right upper lobe nodule, image 16 series 4, stable. Ill-defined density measuring 1.2 by 0.9 cm along the right major fissure medially, image 24 series 4, new compared to the prior exam, but possibly from fluid within the fissure adjacent to a small bulla. Prior lingular wedge resection. Musculoskeletal: T11 lytic destruction of the right vertebral body with about 50% loss of height and extension into the pedicle, with sclerosis centrally in the vertebral body along the margin of the lesion, stable from prior CT ABDOMEN PELVIS FINDINGS Hepatobiliary: Multiple tiny hypodense  hepatic lesions are not appreciably changed from 06/08/2014 and are technically too small to characterize, although statistically likely to be benign. Mildly contracted gallbladder. Pancreas: Unremarkable Spleen: Unremarkable Adrenals/Urinary Tract: 1.4 by 0.9 cm nodule along the apex of the right adrenal gland, just behind the IVC, not apparent on prior exams including 02/12/2015. This could be a small adrenal mass or a new lymph node. 3 mm right kidney lower pole nonobstructive calculus. Possible 1 mm right mid kidney nonobstructive calculus. 3 mm left kidney lower pole nonobstructive calculus. Several tiny hypodense renal lesions are statistically likely to be cysts although technically nonspecific due to tiny size. Stomach/Bowel: Unremarkable Vascular/Lymphatic: Dense aortoiliac atherosclerotic calcification. Reproductive: Unremarkable Other: No supplemental non-categorized findings. Musculoskeletal: Unremarkable IMPRESSION: 1. Improved right hilar node, currently 1.5 cm in short axis, previously 2.0 cm. Essentially stable right upper paratracheal lymph node. 2. Stable appearance of the pathologic fracture at T11. 3. There is a new 1.4 by 0.9 cm and nodule along the apex of the right adrenal gland, possibly an adrenal metastatic lesion or new lymph node,  the this was not present on 02/12/2015. 4. Large right pleural effusion, slightly progressive from prior. Moderate pericardial effusion, mildly progressive from prior. 5. 1.2 by 0.9 cm new density along the right major fissure on image 24 series 4, probably some fluid in the fissure adjacent to a small bulla, but conceivably a cavitary nodule- this merits careful observation. 6. Scattered tiny hypodense hepatic lesions, likely tiny cysts or biliary hamartomas, although technically nonspecific and worthy of observation. 7. Other imaging findings of potential clinical significance: Coronary, aortic arch, and branch vessel atherosclerotic vascular disease.  Aortoiliac atherosclerotic vascular disease. Emphysema. UIP. Bilateral nonobstructive nephrolithiasis. Electronically Signed   By: Van Clines M.D.   On: 04/19/2015 12:45   Ct Abdomen Pelvis W Contrast  04/19/2015  CLINICAL DATA:  Non-small cell right-sided lung cancer originally diagnosed in 2012, recurrence/metastatic disease in 2016. Radiation therapy completed. EXAM: CT CHEST, ABDOMEN, AND PELVIS WITH CONTRAST TECHNIQUE: Multidetector CT imaging of the chest, abdomen and pelvis was performed following the standard protocol during bolus administration of intravenous contrast. CONTRAST:  5m OMNIPAQUE IOHEXOL 300 MG/ML  SOLN COMPARISON:  Multiple exams, including 02/12/2015 FINDINGS: CT CHEST FINDINGS Mediastinum/Nodes: Right upper paratracheal node 0.8 cm short axis image 5 series 2, previously 0.7 cm. Indistinctly marginated right hilar nodal soft tissue density, 1.5 cm in short axis on image 25 series 2, previously 2.0 cm. Moderate pericardial effusion thickness along the cardiac apex, mildly increased from prior. Coronary, aortic arch, and branch vessel atherosclerotic vascular disease. Lungs/Pleura: Large right pleural effusion, somewhat increased in size from prior. Given the anterior pleural irregularity in the right chest for example on image 24 series 2, I favor an exudative effusion although there is no overt enhancement along posterior portions of the effusion margin. Emphysema is present. Interstitial accentuation in the lungs especially peripherally favoring UIP. There is some airway plugging in the left lower lobe. Passive atelectasis in the right lung. There is potentially some early honeycombing in the right lung. Pleural thickening anteriorly in the right mid thorax. 5 mm right upper lobe nodule, image 16 series 4, stable. Ill-defined density measuring 1.2 by 0.9 cm along the right major fissure medially, image 24 series 4, new compared to the prior exam, but possibly from fluid within  the fissure adjacent to a small bulla. Prior lingular wedge resection. Musculoskeletal: T11 lytic destruction of the right vertebral body with about 50% loss of height and extension into the pedicle, with sclerosis centrally in the vertebral body along the margin of the lesion, stable from prior CT ABDOMEN PELVIS FINDINGS Hepatobiliary: Multiple tiny hypodense hepatic lesions are not appreciably changed from 06/08/2014 and are technically too small to characterize, although statistically likely to be benign. Mildly contracted gallbladder. Pancreas: Unremarkable Spleen: Unremarkable Adrenals/Urinary Tract: 1.4 by 0.9 cm nodule along the apex of the right adrenal gland, just behind the IVC, not apparent on prior exams including 02/12/2015. This could be a small adrenal mass or a new lymph node. 3 mm right kidney lower pole nonobstructive calculus. Possible 1 mm right mid kidney nonobstructive calculus. 3 mm left kidney lower pole nonobstructive calculus. Several tiny hypodense renal lesions are statistically likely to be cysts although technically nonspecific due to tiny size. Stomach/Bowel: Unremarkable Vascular/Lymphatic: Dense aortoiliac atherosclerotic calcification. Reproductive: Unremarkable Other: No supplemental non-categorized findings. Musculoskeletal: Unremarkable IMPRESSION: 1. Improved right hilar node, currently 1.5 cm in short axis, previously 2.0 cm. Essentially stable right upper paratracheal lymph node. 2. Stable appearance of the pathologic fracture at T11. 3. There is  a new 1.4 by 0.9 cm and nodule along the apex of the right adrenal gland, possibly an adrenal metastatic lesion or new lymph node, the this was not present on 02/12/2015. 4. Large right pleural effusion, slightly progressive from prior. Moderate pericardial effusion, mildly progressive from prior. 5. 1.2 by 0.9 cm new density along the right major fissure on image 24 series 4, probably some fluid in the fissure adjacent to a small  bulla, but conceivably a cavitary nodule- this merits careful observation. 6. Scattered tiny hypodense hepatic lesions, likely tiny cysts or biliary hamartomas, although technically nonspecific and worthy of observation. 7. Other imaging findings of potential clinical significance: Coronary, aortic arch, and branch vessel atherosclerotic vascular disease. Aortoiliac atherosclerotic vascular disease. Emphysema. UIP. Bilateral nonobstructive nephrolithiasis. Electronically Signed   By: Van Clines M.D.   On: 04/19/2015 12:45   US Thoracentesis Asp Pleural Space W/img Guide  04/30/2015  CLINICAL DATA:  Non-small cell carcinoma of the RIGHT lung EXAM: ULTRASOUND GUIDED upper case that RIGHT THORACENTESIS COMPARISON:  CT abdomen and pelvis 04/19/2015 PROCEDURE: Procedure, benefits, and risks of procedure were discussed with patient. Written informed consent for procedure was obtained. Time out protocol followed. Pleural effusion localized by ultrasound at the posterior RIGHT hemithorax. Skin prepped and draped in usual sterile fashion. Skin and soft tissues anesthetized with 7 mL of 1% lidocaine. 8 French thoracentesis catheter placed into the RIGHT pleural space. 500 mL of yellow colored RIGHT pleural fluid was aspirated by syringe pump. Procedure tolerated well by patient without immediate complication. COMPLICATIONS: None FINDINGS: As above IMPRESSION: Successful ultrasound guided RIGHT thoracentesis yielding 500 mL of pleural fluid. Electronically Signed   By: Lavonia Dana M.D.   On: 04/30/2015 14:42    ASSESSMENT AND PLAN: This is a very pleasant 60years old white male with history of stage IIA non-small cell lung cancer status post a course of concurrent chemoradiation with weekly carboplatin and paclitaxel. The recent CT scan of the chest showed interval decrease in the size of the left hilar mass and adenopathy. He is currently undergoing systemic chemotherapy with carboplatin and gemcitabine status  post 7 ycles He tolerated the treatment well except for fatigue as well as lack of appetite and weight loss. He also has low back pain. Unfortunately the recent CT scan of the chest, abdomen and pelvis showed evidence for disease progression with new right adrenal gland nodule in addition to large right pleural effusion. I discussed the scan results with the patient today. I discussed with him several options for treatment of his condition including palliative care and hospice referral versus consideration of treatment with immunotherapy with Nivolumab 240 MG IV every 2 weeks. I discussed with the patient adverse effect of the immunotherapy including but not limited to immune mediated pneumonitis, skin rash, diarrhea, liver, renal, thyroid or other endocrine dysfunction. He would like to proceed with the immunotherapy and is expected to start the first dose of this treatment on 05/22/2015. The patient would come back for follow-up visit in 3 weeks for reevaluation before starting cycle #2 of his treatment. He would continue on Percocet for the back pain. CODE STATUS: no CODE BLUE He was advised to call immediately if he has any concerning symptoms in the interval. The patient was encouraged to quit smoking and we offered him to smoke cessation program. The patient voices understanding of current disease status and treatment options and is in agreement with the current care plan.  All questions were answered. The patient knows to  call the clinic with any problems, questions or concerns. We can certainly see the patient much sooner if necessary.  Disclaimer: This note was dictated with voice recognition software. Similar sounding words can inadvertently be transcribed and may not be corrected upon review.

## 2015-05-15 NOTE — Telephone Encounter (Signed)
Per staff message and POF I have scheduled appts. Advised scheduler of appts. JMW  

## 2015-05-15 NOTE — Patient Instructions (Signed)
Smoking Cessation, Tips for Success If you are ready to quit smoking, congratulations! You have chosen to help yourself be healthier. Cigarettes bring nicotine, tar, carbon monoxide, and other irritants into your body. Your lungs, heart, and blood vessels will be able to work better without these poisons. There are many different ways to quit smoking. Nicotine gum, nicotine patches, a nicotine inhaler, or nicotine nasal spray can help with physical craving. Hypnosis, support groups, and medicines help break the habit of smoking. WHAT THINGS CAN I DO TO MAKE QUITTING EASIER?  Here are some tips to help you quit for good:  Pick a date when you will quit smoking completely. Tell all of your friends and family about your plan to quit on that date.  Do not try to slowly cut down on the number of cigarettes you are smoking. Pick a quit date and quit smoking completely starting on that day.  Throw away all cigarettes.   Clean and remove all ashtrays from your home, work, and car.  On a card, write down your reasons for quitting. Carry the card with you and read it when you get the urge to smoke.  Cleanse your body of nicotine. Drink enough water and fluids to keep your urine clear or pale yellow. Do this after quitting to flush the nicotine from your body.  Learn to predict your moods. Do not let a bad situation be your excuse to have a cigarette. Some situations in your life might tempt you into wanting a cigarette.  Never have "just one" cigarette. It leads to wanting another and another. Remind yourself of your decision to quit.  Change habits associated with smoking. If you smoked while driving or when feeling stressed, try other activities to replace smoking. Stand up when drinking your coffee. Brush your teeth after eating. Sit in a different chair when you read the paper. Avoid alcohol while trying to quit, and try to drink fewer caffeinated beverages. Alcohol and caffeine may urge you to  smoke.  Avoid foods and drinks that can trigger a desire to smoke, such as sugary or spicy foods and alcohol.  Ask people who smoke not to smoke around you.  Have something planned to do right after eating or having a cup of coffee. For example, plan to take a walk or exercise.  Try a relaxation exercise to calm you down and decrease your stress. Remember, you may be tense and nervous for the first 2 weeks after you quit, but this will pass.  Find new activities to keep your hands busy. Play with a pen, coin, or rubber band. Doodle or draw things on paper.  Brush your teeth right after eating. This will help cut down on the craving for the taste of tobacco after meals. You can also try mouthwash.   Use oral substitutes in place of cigarettes. Try using lemon drops, carrots, cinnamon sticks, or chewing gum. Keep them handy so they are available when you have the urge to smoke.  When you have the urge to smoke, try deep breathing.  Designate your home as a nonsmoking area.  If you are a heavy smoker, ask your health care provider about a prescription for nicotine chewing gum. It can ease your withdrawal from nicotine.  Reward yourself. Set aside the cigarette money you save and buy yourself something nice.  Look for support from others. Join a support group or smoking cessation program. Ask someone at home or at work to help you with your plan   to quit smoking.  Always ask yourself, "Do I need this cigarette or is this just a reflex?" Tell yourself, "Today, I choose not to smoke," or "I do not want to smoke." You are reminding yourself of your decision to quit.  Do not replace cigarette smoking with electronic cigarettes (commonly called e-cigarettes). The safety of e-cigarettes is unknown, and some may contain harmful chemicals.  If you relapse, do not give up! Plan ahead and think about what you will do the next time you get the urge to smoke. HOW WILL I FEEL WHEN I QUIT SMOKING? You  may have symptoms of withdrawal because your body is used to nicotine (the addictive substance in cigarettes). You may crave cigarettes, be irritable, feel very hungry, cough often, get headaches, or have difficulty concentrating. The withdrawal symptoms are only temporary. They are strongest when you first quit but will go away within 10-14 days. When withdrawal symptoms occur, stay in control. Think about your reasons for quitting. Remind yourself that these are signs that your body is healing and getting used to being without cigarettes. Remember that withdrawal symptoms are easier to treat than the major diseases that smoking can cause.  Even after the withdrawal is over, expect periodic urges to smoke. However, these cravings are generally short lived and will go away whether you smoke or not. Do not smoke! WHAT RESOURCES ARE AVAILABLE TO HELP ME QUIT SMOKING? Your health care provider can direct you to community resources or hospitals for support, which may include:  Group support.  Education.  Hypnosis.  Therapy.   This information is not intended to replace advice given to you by your health care provider. Make sure you discuss any questions you have with your health care provider.   Document Released: 01/04/2004 Document Revised: 04/28/2014 Document Reviewed: 09/23/2012 Elsevier Interactive Patient Education 2016 [employer]  

## 2015-05-22 ENCOUNTER — Ambulatory Visit (HOSPITAL_BASED_OUTPATIENT_CLINIC_OR_DEPARTMENT_OTHER): Payer: Medicaid Other

## 2015-05-22 VITALS — BP 145/82 | HR 116 | Resp 17

## 2015-05-22 DIAGNOSIS — Z5112 Encounter for antineoplastic immunotherapy: Secondary | ICD-10-CM | POA: Diagnosis present

## 2015-05-22 DIAGNOSIS — C3490 Malignant neoplasm of unspecified part of unspecified bronchus or lung: Secondary | ICD-10-CM

## 2015-05-22 DIAGNOSIS — C3491 Malignant neoplasm of unspecified part of right bronchus or lung: Secondary | ICD-10-CM

## 2015-05-22 DIAGNOSIS — C7951 Secondary malignant neoplasm of bone: Secondary | ICD-10-CM

## 2015-05-22 MED ORDER — SODIUM CHLORIDE 0.9 % IV SOLN
Freq: Once | INTRAVENOUS | Status: AC
  Administered 2015-05-22: 09:00:00 via INTRAVENOUS

## 2015-05-22 MED ORDER — SODIUM CHLORIDE 0.9 % IV SOLN
240.0000 mg | Freq: Once | INTRAVENOUS | Status: AC
  Administered 2015-05-22: 240 mg via INTRAVENOUS
  Filled 2015-05-22: qty 20

## 2015-05-22 NOTE — Progress Notes (Signed)
OK to treat using labs from 1/24 per Dr. Julien Nordmann.

## 2015-05-22 NOTE — Patient Instructions (Signed)
[facility] Discharge Instructions for Patients Receiving Chemotherapy  Today you received the following chemotherapy agents nivolumab  To help prevent nausea and vomiting after your treatment, we encourage you to take your nausea medication as directed   If you develop nausea and vomiting that is not controlled by your nausea medication, call the clinic.   BELOW ARE SYMPTOMS THAT SHOULD BE REPORTED IMMEDIATELY:  *FEVER GREATER THAN 100.5 F  *CHILLS WITH OR WITHOUT FEVER  NAUSEA AND VOMITING THAT IS NOT CONTROLLED WITH YOUR NAUSEA MEDICATION  *UNUSUAL SHORTNESS OF BREATH  *UNUSUAL BRUISING OR BLEEDING  TENDERNESS IN MOUTH AND THROAT WITH OR WITHOUT PRESENCE OF ULCERS  *URINARY PROBLEMS  *BOWEL PROBLEMS  UNUSUAL RASH Items with * indicate a potential emergency and should be followed up as soon as possible.  Feel free to call the clinic you have any questions or concerns. The clinic phone number is [phone].  Nivolumab injection What is this medicine? NIVOLUMAB (nye VOL ue mab) is a monoclonal antibody. It is used to treat melanoma, lung cancer, kidney cancer, and Hodgkin lymphoma. This medicine may be used for other purposes; ask your health care provider or pharmacist if you have questions. What should I tell my health care provider before I take this medicine? They need to know if you have any of these conditions: -diabetes -immune system problems -kidney disease -liver disease -lung disease -organ transplant -stomach or intestine problems -thyroid disease -an unusual or allergic reaction to nivolumab, other medicines, foods, dyes, or preservatives -pregnant or trying to get pregnant -breast-feeding How should I use this medicine? This medicine is for infusion into a vein. It is given by a health care professional in a hospital or clinic setting. A special MedGuide will be given to you before each treatment. Be sure to read this information  carefully each time. Talk to your pediatrician regarding the use of this medicine in children. Special care may be needed. Overdosage: If you think you have taken too much of this medicine contact a poison control center or emergency room at once. NOTE: This medicine is only for you. Do not share this medicine with others. What if I miss a dose? It is important not to miss your dose. Call your doctor or health care professional if you are unable to keep an appointment. What may interact with this medicine? Interactions have not been studied. Give your health care provider a list of all the medicines, herbs, non-prescription drugs, or dietary supplements you use. Also tell them if you smoke, drink alcohol, or use illegal drugs. Some items may interact with your medicine. This list may not describe all possible interactions. Give your health care provider a list of all the medicines, herbs, non-prescription drugs, or dietary supplements you use. Also tell them if you smoke, drink alcohol, or use illegal drugs. Some items may interact with your medicine. What should I watch for while using this medicine? This drug may make you feel generally unwell. Continue your course of treatment even though you feel ill unless your doctor tells you to stop. You may need blood work done while you are taking this medicine. Do not become pregnant while taking this medicine or for 5 months after stopping it. Women should inform their doctor if they wish to become pregnant or think they might be pregnant. There is a potential for serious side effects to an unborn child. Talk to your health care professional or pharmacist for more information. Do not breast-feed an  infant while taking this medicine. What side effects may I notice from receiving this medicine? Side effects that you should report to your doctor or health care professional as soon as possible: -allergic reactions like skin rash, itching or hives, swelling of  the face, lips, or tongue -black, tarry stools -blood in the urine -bloody or watery diarrhea -changes in vision -change in sex drive -changes in emotions or moods -chest pain -confusion -cough -decreased appetite -diarrhea -facial flushing -feeling faint or lightheaded -fever, chills -hair loss -hallucination, loss of contact with reality -headache -irritable -joint pain -loss of memory -muscle pain -muscle weakness -seizures -shortness of breath -signs and symptoms of high blood sugar such as dizziness; dry mouth; dry skin; fruity breath; nausea; stomach pain; increased hunger or thirst; increased urination -signs and symptoms of kidney injury like trouble passing urine or change in the amount of urine -signs and symptoms of liver injury like dark yellow or brown urine; general ill feeling or flu-like symptoms; light-colored stools; loss of appetite; nausea; right upper belly pain; unusually weak or tired; yellowing of the eyes or skin -stiff neck -swelling of the ankles, feet, hands -weight gain Side effects that usually do not require medical attention (report to your doctor or health care professional if they continue or are bothersome): -bone pain -constipation -tiredness -vomiting This list may not describe all possible side effects. Call your doctor for medical advice about side effects. You may report side effects to FDA at 1-800-FDA-1088. Where should I keep my medicine? This drug is given in a hospital or clinic and will not be stored at home. NOTE: This sheet is a summary. It may not cover all possible information. If you have questions about this medicine, talk to your doctor, pharmacist, or health care provider.    2016, [employer]vier/Gold Standard. (2014-09-06 10:03:42)

## 2015-05-28 ENCOUNTER — Telehealth: Payer: Self-pay | Admitting: Medical Oncology

## 2015-05-28 NOTE — Telephone Encounter (Signed)
Spoke to [name]  was nauseated for 2 days after nivolumab and is feeling pretty good today. Went to [facility] to see Dr Sondra Come. I told Felicia for pt to call back if he has any questions or concerns.

## 2015-06-05 ENCOUNTER — Ambulatory Visit (HOSPITAL_BASED_OUTPATIENT_CLINIC_OR_DEPARTMENT_OTHER): Payer: Medicaid Other

## 2015-06-05 ENCOUNTER — Encounter: Payer: Self-pay | Admitting: Internal Medicine

## 2015-06-05 ENCOUNTER — Telehealth: Payer: Self-pay | Admitting: *Deleted

## 2015-06-05 ENCOUNTER — Ambulatory Visit (HOSPITAL_BASED_OUTPATIENT_CLINIC_OR_DEPARTMENT_OTHER): Payer: Medicaid Other | Admitting: Internal Medicine

## 2015-06-05 ENCOUNTER — Ambulatory Visit: Payer: Medicaid Other | Admitting: Nutrition

## 2015-06-05 ENCOUNTER — Other Ambulatory Visit (HOSPITAL_BASED_OUTPATIENT_CLINIC_OR_DEPARTMENT_OTHER): Payer: Medicaid Other

## 2015-06-05 ENCOUNTER — Telehealth: Payer: Self-pay | Admitting: Internal Medicine

## 2015-06-05 VITALS — BP 109/70 | HR 104

## 2015-06-05 VITALS — BP 86/65 | HR 130 | Temp 97.9°F | Resp 17 | Ht 65.0 in | Wt 101.5 lb

## 2015-06-05 DIAGNOSIS — R630 Anorexia: Secondary | ICD-10-CM

## 2015-06-05 DIAGNOSIS — R634 Abnormal weight loss: Secondary | ICD-10-CM

## 2015-06-05 DIAGNOSIS — C7951 Secondary malignant neoplasm of bone: Secondary | ICD-10-CM

## 2015-06-05 DIAGNOSIS — M549 Dorsalgia, unspecified: Secondary | ICD-10-CM | POA: Diagnosis not present

## 2015-06-05 DIAGNOSIS — Z5111 Encounter for antineoplastic chemotherapy: Secondary | ICD-10-CM

## 2015-06-05 DIAGNOSIS — C3491 Malignant neoplasm of unspecified part of right bronchus or lung: Secondary | ICD-10-CM

## 2015-06-05 DIAGNOSIS — I959 Hypotension, unspecified: Secondary | ICD-10-CM | POA: Diagnosis not present

## 2015-06-05 DIAGNOSIS — E860 Dehydration: Secondary | ICD-10-CM | POA: Diagnosis not present

## 2015-06-05 DIAGNOSIS — Z720 Tobacco use: Secondary | ICD-10-CM | POA: Diagnosis not present

## 2015-06-05 DIAGNOSIS — C3490 Malignant neoplasm of unspecified part of unspecified bronchus or lung: Secondary | ICD-10-CM | POA: Diagnosis present

## 2015-06-05 DIAGNOSIS — J90 Pleural effusion, not elsewhere classified: Secondary | ICD-10-CM

## 2015-06-05 DIAGNOSIS — E8809 Other disorders of plasma-protein metabolism, not elsewhere classified: Secondary | ICD-10-CM

## 2015-06-05 DIAGNOSIS — E46 Unspecified protein-calorie malnutrition: Secondary | ICD-10-CM

## 2015-06-05 DIAGNOSIS — Z5112 Encounter for antineoplastic immunotherapy: Secondary | ICD-10-CM

## 2015-06-05 DIAGNOSIS — R5383 Other fatigue: Secondary | ICD-10-CM | POA: Diagnosis not present

## 2015-06-05 DIAGNOSIS — Z79899 Other long term (current) drug therapy: Secondary | ICD-10-CM

## 2015-06-05 HISTORY — DX: Encounter for antineoplastic immunotherapy: Z51.12

## 2015-06-05 LAB — COMPREHENSIVE METABOLIC PANEL
ALBUMIN: 2.6 g/dL — AB (ref 3.5–5.0)
ANION GAP: 11 meq/L (ref 3–11)
AST: 10 U/L (ref 5–34)
Alkaline Phosphatase: 101 U/L (ref 40–150)
BILIRUBIN TOTAL: 0.52 mg/dL (ref 0.20–1.20)
BUN: 14.8 mg/dL (ref 7.0–26.0)
CALCIUM: 9.1 mg/dL (ref 8.4–10.4)
CHLORIDE: 101 meq/L (ref 98–109)
CO2: 23 mEq/L (ref 22–29)
CREATININE: 1 mg/dL (ref 0.7–1.3)
EGFR: 80 mL/min/{1.73_m2} — ABNORMAL LOW (ref >90)
Glucose: 261 mg/dl — ABNORMAL HIGH (ref 70–140)
Potassium: 3.8 mEq/L (ref 3.5–5.1)
Sodium: 136 mEq/L (ref 136–145)
TOTAL PROTEIN: 7.5 g/dL (ref 6.4–8.3)

## 2015-06-05 LAB — CBC WITH DIFFERENTIAL/PLATELET
BASO%: 0.7 % (ref 0.0–2.0)
Basophils Absolute: 0 10*3/uL (ref 0.0–0.1)
EOS%: 0.6 % (ref 0.0–7.0)
Eosinophils Absolute: 0 10*3/uL (ref 0.0–0.5)
HEMATOCRIT: 31.7 % — AB (ref 38.4–49.9)
HEMOGLOBIN: 10.3 g/dL — AB (ref 13.0–17.1)
LYMPH#: 0.4 10*3/uL — AB (ref 0.9–3.3)
LYMPH%: 7.5 % — ABNORMAL LOW (ref 14.0–49.0)
MCH: 30.4 pg (ref 27.2–33.4)
MCHC: 32.4 g/dL (ref 32.0–36.0)
MCV: 93.9 fL (ref 79.3–98.0)
MONO#: 0.9 10*3/uL (ref 0.1–0.9)
MONO%: 16.6 % — ABNORMAL HIGH (ref 0.0–14.0)
NEUT%: 74.6 % (ref 39.0–75.0)
NEUTROS ABS: 4.1 10*3/uL (ref 1.5–6.5)
PLATELETS: 203 10*3/uL (ref 140–400)
RBC: 3.38 10*6/uL — ABNORMAL LOW (ref 4.20–5.82)
RDW: 15.9 % — ABNORMAL HIGH (ref 11.0–14.6)
WBC: 5.5 10*3/uL (ref 4.0–10.3)

## 2015-06-05 LAB — TSH: TSH: 3.45 m[IU]/L (ref 0.320–4.118)

## 2015-06-05 MED ORDER — SODIUM CHLORIDE 0.9 % IV SOLN
240.0000 mg | Freq: Once | INTRAVENOUS | Status: AC
  Administered 2015-06-05: 240 mg via INTRAVENOUS
  Filled 2015-06-05: qty 24

## 2015-06-05 MED ORDER — SODIUM CHLORIDE 0.9 % IV SOLN
1000.0000 mL | Freq: Once | INTRAVENOUS | Status: DC
  Administered 2015-06-05: 1000 mL via INTRAVENOUS

## 2015-06-05 MED ORDER — SODIUM CHLORIDE 0.9 % IV SOLN
Freq: Once | INTRAVENOUS | Status: AC
  Administered 2015-06-05: 11:00:00 via INTRAVENOUS

## 2015-06-05 NOTE — Patient Instructions (Signed)
[facility] Discharge Instructions for Patients Receiving Chemotherapy  Today you received the following chemotherapy agents Nivolumab.  To help prevent nausea and vomiting after your treatment, we encourage you to take your nausea medication as prescribed.   If you develop nausea and vomiting that is not controlled by your nausea medication, call the clinic.   BELOW ARE SYMPTOMS THAT SHOULD BE REPORTED IMMEDIATELY:  *FEVER GREATER THAN 100.5 F  *CHILLS WITH OR WITHOUT FEVER  NAUSEA AND VOMITING THAT IS NOT CONTROLLED WITH YOUR NAUSEA MEDICATION  *UNUSUAL SHORTNESS OF BREATH  *UNUSUAL BRUISING OR BLEEDING  TENDERNESS IN MOUTH AND THROAT WITH OR WITHOUT PRESENCE OF ULCERS  *URINARY PROBLEMS  *BOWEL PROBLEMS  UNUSUAL RASH Items with * indicate a potential emergency and should be followed up as soon as possible.  Feel free to call the clinic you have any questions or concerns. The clinic phone number is [phone].  Please show the CHEMO ALERT CARD at check-in to the Emergency Department and triage nurse.   

## 2015-06-05 NOTE — Progress Notes (Signed)
[facility] [phone]   Fax:([phone]) [place], [place] [address]  DIAGNOSIS: Metastatic non-small cell lung cancer initially diagnosed as Unresectable a stage IIA (T1a, N1, M0) non-small cell lung cancer, invasive poorly differentiated carcinoma diagnosed in March 2016  PRIOR THERAPY:  1) A course of concurrent chemoradiation with weekly carboplatin for AUC of 2 and paclitaxel 45 MG/M2. 2) concurrent chemoradiation with weekly carboplatin for AUC of 2 and paclitaxel 45 mg/m2 resumed on 10/09/14. Completed his last dose on 10/24/14. 3) Systemic chemotherapy with carboplatin for AUC of 5 on day 1 and gemcitabine 1000 MG/M2 on days 1 and 8 every 3 weeks. First dose on 12/12/2014. Status post 7 cycles. Last dose was given 04/24/2015. This was discontinued secondary to disease progression  CURRENT THERAPY: Nivolumab 240 MG IV every 2 weeks, first dose 05/22/2015. Status post one cycle.  INTERVAL HISTORY: Adam Warner 60 y.o. male returns to the clinic today for follow-up visit. The patient continues to complain of fatigue and weakness as well as lack of appetite. She did not have any significant weight loss since his last visit. He continues to have hypotension secondary to dehydration. The patient related the first cycle of his immunotherapy with Nivolumab fairly well. He denied having any significant chest pain, but has shortness breath with exertion with no cough or hemoptysis. He denied having any significant nausea or vomiting, no fever or chills. He is here today to start cycle #2 of his immunotherapy.  MEDICAL HISTORY: Past Medical History  Diagnosis Date  . COPD (chronic obstructive pulmonary disease) ([facility])   . Stroke ([facility])   . Hypertension   . Heart attack ([facility]) 1991  . Pulmonary nodule   . Coronary artery disease     sees Dr Spero Curb at [facility] every 2-3 years  . Shortness of breath dyspnea     . Full dentures   . Diabetes mellitus     Type Niddm x 2 years  . DNR no code (do not resuscitate) 01/23/2015  . DNR no code (do not resuscitate) 01/23/2015    ALLERGIES:  is allergic to zyban.  MEDICATIONS:  Current Outpatient Prescriptions  Medication Sig Dispense Refill  . ACCU-CHEK AVIVA PLUS test strip USE TO CHECK ONCE OR TWICE DAILY 100 each 0  . ACCU-CHEK FASTCLIX LANCETS MISC Check blood sugar bid and prn 102 each 0  . aspirin EC 325 MG tablet Take 325 mg by mouth every morning. Reported on 04/17/2015    . atorvastatin (LIPITOR) 40 MG tablet TAKE ONE TABLET BY MOUTH ONE TIME DAILY 30 tablet 6  . Blood Glucose Monitoring Suppl (ONE TOUCH ULTRA 2) W/DEVICE KIT Use monitor to test blood sugar qd Dx 250.00 1 each 0  . Cholecalciferol (VITAMIN D) 2000 UNITS CAPS Take 1 capsule by mouth daily.    . feeding supplement, GLUCERNA SHAKE, (GLUCERNA SHAKE) LIQD Take 237 mLs by mouth 2 (two) times daily between meals. 60 Can 5  . metFORMIN (GLUCOPHAGE) 500 MG tablet TAKE ONE TABLET BY MOUTH TWICE A DAY WITH MEALS 60 tablet 5  . OVER THE COUNTER MEDICATION Take 1 tablet by mouth daily as needed.    Marland Kitchen oxyCODONE-acetaminophen (PERCOCET/ROXICET) 5-325 MG tablet Take 1 tablet by mouth every 6 (six) hours as needed for moderate pain or severe pain. 30 tablet 0  . prochlorperazine (COMPAZINE) 10 MG tablet Take 1 tablet (10 mg total) by mouth every  6 (six) hours as needed for nausea or vomiting. (Patient not taking: Reported on 05/15/2015) 30 tablet 0  . tiotropium (SPIRIVA) 18 MCG inhalation capsule Place 1 capsule (18 mcg total) into inhaler and inhale daily. 30 capsule 6   No current facility-administered medications for this visit.    SURGICAL HISTORY:  Past Surgical History  Procedure Laterality Date  . Lung surgery      Dr Arlyce Dice  . Angioplasty    . Colonoscopy  10/22/2011    Procedure: COLONOSCOPY;  Surgeon: Rogene Houston, MD;  Location: [facility];  Service: Endoscopy;  Laterality:  N/A;  1200  . Carotid endarterectomy Right   . Video bronchoscopy with endobronchial ultrasound N/A 05/29/2014    Procedure: VIDEO BRONCHOSCOPY WITH ENDOBRONCHIAL ULTRASOUND;  Surgeon: Melrose Nakayama, MD;  Location: [facility];  Service: Thoracic;  Laterality: N/A;    REVIEW OF SYSTEMS:  Constitutional: positive for anorexia and fatigue Eyes: negative Ears, nose, mouth, throat, and face: negative Respiratory: positive for cough and dyspnea on exertion Cardiovascular: negative Gastrointestinal: negative Genitourinary:negative Integument/breast: negative Hematologic/lymphatic: negative Musculoskeletal:positive for back pain Neurological: negative Behavioral/Psych: negative Endocrine: negative Allergic/Immunologic: negative   PHYSICAL EXAMINATION: General appearance: alert, cooperative and no distress Head: Normocephalic, without obvious abnormality, atraumatic Neck: no adenopathy, no JVD, supple, symmetrical, trachea midline and thyroid not enlarged, symmetric, no tenderness/mass/nodules Lymph nodes: Cervical, supraclavicular, and axillary nodes normal. Resp: clear to auscultation bilaterally Back: symmetric, no curvature. ROM normal. No CVA tenderness. Cardio: regular rate and rhythm, S1, S2 normal, no murmur, click, rub or gallop GI: soft, non-tender; bowel sounds normal; no masses,  no organomegaly Extremities: extremities normal, atraumatic, no cyanosis or edema Neurologic: Alert and oriented X 3, normal strength and tone. Normal symmetric reflexes. Normal coordination and gait  ECOG PERFORMANCE STATUS: 1 - Symptomatic but completely ambulatory  There were no vitals taken for this visit.  LABORATORY DATA: Lab Results  Component Value Date   WBC 5.5 06/05/2015   HGB 10.3* 06/05/2015   HCT 31.7* 06/05/2015   MCV 93.9 06/05/2015   PLT 203 06/05/2015      Chemistry      Component Value Date/Time   NA 137 05/15/2015 0914   NA 136 05/23/2014 1015   NA 139 04/12/2014 1029    K 4.1 05/15/2015 0914   K 4.1 05/23/2014 1015   CL 106 05/23/2014 1015   CO2 24 05/15/2015 0914   CO2 25 05/23/2014 1015   BUN 13.4 05/15/2015 0914   BUN 10 05/23/2014 1015   BUN 12 04/12/2014 1029   CREATININE 1.0 05/15/2015 0914   CREATININE 1.07 05/23/2014 1015   CREATININE 1.05 11/04/2012 1242      Component Value Date/Time   CALCIUM 8.8 05/15/2015 0914   CALCIUM 9.3 05/23/2014 1015   ALKPHOS 95 05/15/2015 0914   ALKPHOS 90 05/23/2014 1015   AST 14 05/15/2015 0914   AST 19 05/23/2014 1015   ALT <9 05/15/2015 0914   ALT 17 05/23/2014 1015   BILITOT 0.41 05/15/2015 0914   BILITOT 0.6 05/23/2014 1015       RADIOGRAPHIC STUDIES: No results found.  ASSESSMENT AND PLAN: This is a very pleasant 60years old white male with history of stage IIA non-small cell lung cancer status post a course of concurrent chemoradiation with weekly carboplatin and paclitaxel. The recent CT scan of the chest showed interval decrease in the size of the left hilar mass and adenopathy. He is currently undergoing systemic chemotherapy with carboplatin and gemcitabine status  post 7 ycles He tolerated the treatment well except for fatigue as well as lack of appetite and weight loss. He also has low back pain. Unfortunately the recent CT scan of the chest, abdomen and pelvis showed evidence for disease progression with new right adrenal gland nodule in addition to large right pleural effusion. He is currently undergoing treatment with immunotherapy with Nivolumab status post 1 cycle. He is tolerating his treatment well. I recommended for the patient to proceed with cycle #2 today as a scheduled. The patient would come back for follow-up visit in 2 weeks for reevaluation before starting cycle #3 of his treatment. He would continue on Percocet for the back pain. For dehydration, I will arrange for the patient to receive 1 L of normal saline IV today. He was also encouraged to increase his by mouth  intake. For smoke cessation, strongly advised the patient to quit smoking and offered him a smoke cessation program. CODE STATUS: no CODE BLUE He was advised to call immediately if he has any concerning symptoms in the interval. The patient voices understanding of current disease status and treatment options and is in agreement with the current care plan.  All questions were answered. The patient knows to call the clinic with any problems, questions or concerns. We can certainly see the patient much sooner if necessary.  Disclaimer: This note was dictated with voice recognition software. Similar sounding words can inadvertently be transcribed and may not be corrected upon review.

## 2015-06-05 NOTE — Telephone Encounter (Signed)
Per staff message and POF I have scheduled appts. Advised scheduler of appts. JMW  

## 2015-06-05 NOTE — Progress Notes (Signed)
Nutrition follow-up completed with patient during infusion for non-small cell lung cancer. Patient has had continued weight loss with new weight documented as 101 pounds decreased from 125 pounds as a usual body weight before diagnosis.   However, overall weight is stable with usual body weight approximately 105 pounds over the past 5 months. Patient states recent weight loss is due to virus last week. He had nausea and vomiting. Patient complains of constipation but is now taking stool softener. Continues to drink oral nutrition supplements.  Nutrition diagnosis: Unintended weight loss continues. Underweight related to non-small cell lung cancer as evidenced by BMI 16.89.  Intervention: Provided support and encouragement for patient to work to increase oral intake using high-calorie, high-protein foods and oral nutrition supplements. Patient has good understanding of what is required to maintain his weight. Teach back method was used.  Monitoring, evaluation, goals: Patient will maintain lean body mass.  Without further weight loss.  Next visit: To be scheduled as needed.  **Disclaimer: This note was dictated with voice recognition software. Similar sounding words can inadvertently be transcribed and this note may contain transcription errors which may not have been corrected upon publication of note.**

## 2015-06-05 NOTE — Telephone Encounter (Signed)
Pt confirmed labs/ov per 02/14 POF, gave pt AVS and Calendar..... KJ, sent msg to add chemo

## 2015-06-19 ENCOUNTER — Other Ambulatory Visit (HOSPITAL_BASED_OUTPATIENT_CLINIC_OR_DEPARTMENT_OTHER): Payer: Medicaid Other

## 2015-06-19 ENCOUNTER — Ambulatory Visit (HOSPITAL_BASED_OUTPATIENT_CLINIC_OR_DEPARTMENT_OTHER): Payer: Medicaid Other

## 2015-06-19 VITALS — BP 120/73 | HR 110 | Temp 97.3°F | Resp 20

## 2015-06-19 DIAGNOSIS — Z5112 Encounter for antineoplastic immunotherapy: Secondary | ICD-10-CM

## 2015-06-19 DIAGNOSIS — C3490 Malignant neoplasm of unspecified part of unspecified bronchus or lung: Secondary | ICD-10-CM

## 2015-06-19 DIAGNOSIS — C7951 Secondary malignant neoplasm of bone: Secondary | ICD-10-CM

## 2015-06-19 DIAGNOSIS — C3491 Malignant neoplasm of unspecified part of right bronchus or lung: Secondary | ICD-10-CM

## 2015-06-19 LAB — COMPREHENSIVE METABOLIC PANEL
ALBUMIN: 2.3 g/dL — AB (ref 3.5–5.0)
ALK PHOS: 116 U/L (ref 40–150)
ALT: <9 U/L (ref 0–55)
ANION GAP: 11 meq/L (ref 3–11)
AST: 10 U/L (ref 5–34)
BUN: 14 mg/dL (ref 7.0–26.0)
CALCIUM: 9.2 mg/dL (ref 8.4–10.4)
CO2: 22 mEq/L (ref 22–29)
Chloride: 98 mEq/L (ref 98–109)
Creatinine: 0.9 mg/dL (ref 0.7–1.3)
Glucose: 183 mg/dl — ABNORMAL HIGH (ref 70–140)
POTASSIUM: 4 meq/L (ref 3.5–5.1)
SODIUM: 132 meq/L — AB (ref 136–145)
Total Bilirubin: 0.57 mg/dL (ref 0.20–1.20)
Total Protein: 7.7 g/dL (ref 6.4–8.3)

## 2015-06-19 LAB — CBC WITH DIFFERENTIAL/PLATELET
BASO%: 0.9 % (ref 0.0–2.0)
BASOS ABS: 0.1 10*3/uL (ref 0.0–0.1)
EOS ABS: 0.1 10*3/uL (ref 0.0–0.5)
EOS%: 1.4 % (ref 0.0–7.0)
HEMATOCRIT: 28.2 % — AB (ref 38.4–49.9)
HEMOGLOBIN: 9.1 g/dL — AB (ref 13.0–17.1)
LYMPH%: 5.9 % — ABNORMAL LOW (ref 14.0–49.0)
MCH: 28.7 pg (ref 27.2–33.4)
MCHC: 32.2 g/dL (ref 32.0–36.0)
MCV: 89.1 fL (ref 79.3–98.0)
MONO#: 1.1 10*3/uL — ABNORMAL HIGH (ref 0.1–0.9)
MONO%: 13.5 % (ref 0.0–14.0)
NEUT#: 6.3 10*3/uL (ref 1.5–6.5)
NEUT%: 78.3 % — ABNORMAL HIGH (ref 39.0–75.0)
Platelets: 248 10*3/uL (ref 140–400)
RBC: 3.17 10*6/uL — ABNORMAL LOW (ref 4.20–5.82)
RDW: 16.6 % — ABNORMAL HIGH (ref 11.0–14.6)
WBC: 8 10*3/uL (ref 4.0–10.3)
lymph#: 0.5 10*3/uL — ABNORMAL LOW (ref 0.9–3.3)

## 2015-06-19 MED ORDER — NIVOLUMAB CHEMO INJECTION 100 MG/10ML
240.0000 mg | Freq: Once | INTRAVENOUS | Status: AC
  Administered 2015-06-19: 240 mg via INTRAVENOUS
  Filled 2015-06-19: qty 8

## 2015-06-19 MED ORDER — SODIUM CHLORIDE 0.9 % IV SOLN
Freq: Once | INTRAVENOUS | Status: AC
  Administered 2015-06-19: 11:00:00 via INTRAVENOUS

## 2015-06-19 MED ORDER — SODIUM CHLORIDE 0.9 % IV SOLN: 240.0000 mg | Freq: Once | INTRAVENOUS | Status: DC

## 2015-06-19 NOTE — Patient Instructions (Signed)
[facility] Discharge Instructions for Patients Receiving Chemotherapy  Today you received the following chemotherapy agents Nivolumab.  To help prevent nausea and vomiting after your treatment, we encourage you to take your nausea medication as prescribed.   If you develop nausea and vomiting that is not controlled by your nausea medication, call the clinic.   BELOW ARE SYMPTOMS THAT SHOULD BE REPORTED IMMEDIATELY:  *FEVER GREATER THAN 100.5 F  *CHILLS WITH OR WITHOUT FEVER  NAUSEA AND VOMITING THAT IS NOT CONTROLLED WITH YOUR NAUSEA MEDICATION  *UNUSUAL SHORTNESS OF BREATH  *UNUSUAL BRUISING OR BLEEDING  TENDERNESS IN MOUTH AND THROAT WITH OR WITHOUT PRESENCE OF ULCERS  *URINARY PROBLEMS  *BOWEL PROBLEMS  UNUSUAL RASH Items with * indicate a potential emergency and should be followed up as soon as possible.  Feel free to call the clinic you have any questions or concerns. The clinic phone number is [phone].  Please show the CHEMO ALERT CARD at check-in to the Emergency Department and triage nurse.   

## 2015-07-03 ENCOUNTER — Ambulatory Visit (HOSPITAL_BASED_OUTPATIENT_CLINIC_OR_DEPARTMENT_OTHER): Payer: Medicaid Other | Admitting: Internal Medicine

## 2015-07-03 ENCOUNTER — Telehealth: Payer: Self-pay | Admitting: Internal Medicine

## 2015-07-03 ENCOUNTER — Other Ambulatory Visit (HOSPITAL_BASED_OUTPATIENT_CLINIC_OR_DEPARTMENT_OTHER): Payer: Medicaid Other

## 2015-07-03 ENCOUNTER — Telehealth: Payer: Self-pay | Admitting: *Deleted

## 2015-07-03 ENCOUNTER — Ambulatory Visit (HOSPITAL_BASED_OUTPATIENT_CLINIC_OR_DEPARTMENT_OTHER): Payer: Medicaid Other

## 2015-07-03 ENCOUNTER — Encounter: Payer: Self-pay | Admitting: Internal Medicine

## 2015-07-03 VITALS — BP 92/64 | HR 133 | Temp 98.1°F | Resp 18 | Ht 65.0 in | Wt 94.5 lb

## 2015-07-03 DIAGNOSIS — C3491 Malignant neoplasm of unspecified part of right bronchus or lung: Secondary | ICD-10-CM

## 2015-07-03 DIAGNOSIS — M545 Low back pain: Secondary | ICD-10-CM

## 2015-07-03 DIAGNOSIS — C3490 Malignant neoplasm of unspecified part of unspecified bronchus or lung: Secondary | ICD-10-CM | POA: Diagnosis not present

## 2015-07-03 DIAGNOSIS — E46 Unspecified protein-calorie malnutrition: Secondary | ICD-10-CM

## 2015-07-03 DIAGNOSIS — R5383 Other fatigue: Secondary | ICD-10-CM

## 2015-07-03 DIAGNOSIS — J90 Pleural effusion, not elsewhere classified: Secondary | ICD-10-CM

## 2015-07-03 DIAGNOSIS — R634 Abnormal weight loss: Secondary | ICD-10-CM | POA: Diagnosis not present

## 2015-07-03 DIAGNOSIS — Z5112 Encounter for antineoplastic immunotherapy: Secondary | ICD-10-CM | POA: Diagnosis not present

## 2015-07-03 DIAGNOSIS — C7951 Secondary malignant neoplasm of bone: Secondary | ICD-10-CM

## 2015-07-03 DIAGNOSIS — E8809 Other disorders of plasma-protein metabolism, not elsewhere classified: Secondary | ICD-10-CM

## 2015-07-03 DIAGNOSIS — R630 Anorexia: Secondary | ICD-10-CM | POA: Diagnosis not present

## 2015-07-03 DIAGNOSIS — E279 Disorder of adrenal gland, unspecified: Secondary | ICD-10-CM

## 2015-07-03 LAB — COMPREHENSIVE METABOLIC PANEL
ANION GAP: 11 meq/L (ref 3–11)
AST: 10 U/L (ref 5–34)
Albumin: 2.2 g/dL — ABNORMAL LOW (ref 3.5–5.0)
Alkaline Phosphatase: 120 U/L (ref 40–150)
BUN: 15.8 mg/dL (ref 7.0–26.0)
CHLORIDE: 96 meq/L — AB (ref 98–109)
CO2: 25 meq/L (ref 22–29)
CREATININE: 1 mg/dL (ref 0.7–1.3)
Calcium: 9.6 mg/dL (ref 8.4–10.4)
EGFR: 82 mL/min/{1.73_m2} — ABNORMAL LOW (ref >90)
Glucose: 232 mg/dl — ABNORMAL HIGH (ref 70–140)
Potassium: 3.9 mEq/L (ref 3.5–5.1)
SODIUM: 132 meq/L — AB (ref 136–145)
Total Bilirubin: 0.54 mg/dL (ref 0.20–1.20)
Total Protein: 7.7 g/dL (ref 6.4–8.3)

## 2015-07-03 LAB — CBC WITH DIFFERENTIAL/PLATELET
BASO%: 0.4 % (ref 0.0–2.0)
Basophils Absolute: 0 10*3/uL (ref 0.0–0.1)
EOS ABS: 0 10*3/uL (ref 0.0–0.5)
EOS%: 0.5 % (ref 0.0–7.0)
HEMATOCRIT: 29.4 % — AB (ref 38.4–49.9)
HGB: 9.3 g/dL — ABNORMAL LOW (ref 13.0–17.1)
LYMPH#: 0.4 10*3/uL — AB (ref 0.9–3.3)
LYMPH%: 4.3 % — ABNORMAL LOW (ref 14.0–49.0)
MCH: 27.3 pg (ref 27.2–33.4)
MCHC: 31.7 g/dL — ABNORMAL LOW (ref 32.0–36.0)
MCV: 86.2 fL (ref 79.3–98.0)
MONO#: 0.9 10*3/uL (ref 0.1–0.9)
MONO%: 9.3 % (ref 0.0–14.0)
NEUT%: 85.5 % — AB (ref 39.0–75.0)
NEUTROS ABS: 7.9 10*3/uL — AB (ref 1.5–6.5)
PLATELETS: 166 10*3/uL (ref 140–400)
RBC: 3.41 10*6/uL — ABNORMAL LOW (ref 4.20–5.82)
RDW: 17.4 % — ABNORMAL HIGH (ref 11.0–14.6)
WBC: 9.2 10*3/uL (ref 4.0–10.3)

## 2015-07-03 LAB — TSH: TSH: 4.191 m(IU)/L — ABNORMAL HIGH (ref 0.320–4.118)

## 2015-07-03 MED ORDER — SODIUM CHLORIDE 0.9 % IV SOLN
Freq: Once | INTRAVENOUS | Status: AC
  Administered 2015-07-03: 10:00:00 via INTRAVENOUS

## 2015-07-03 MED ORDER — SODIUM CHLORIDE 0.9 % IV SOLN
240.0000 mg | Freq: Once | INTRAVENOUS | Status: AC
  Administered 2015-07-03: 240 mg via INTRAVENOUS
  Filled 2015-07-03: qty 20

## 2015-07-03 MED ORDER — OXYCODONE-ACETAMINOPHEN 5-325 MG PO TABS: 1.0000 | ORAL_TABLET | Freq: Four times a day (QID) | ORAL | Status: AC | PRN

## 2015-07-03 NOTE — Telephone Encounter (Signed)
per pof to sch pt appt-sent MW email to sch trmt-pt to get updated copy b4 leaving

## 2015-07-03 NOTE — Patient Instructions (Signed)
Smoking Cessation, Tips for Success If you are ready to quit smoking, congratulations! You have chosen to help yourself be healthier. Cigarettes bring nicotine, tar, carbon monoxide, and other irritants into your body. Your lungs, heart, and blood vessels will be able to work better without these poisons. There are many different ways to quit smoking. Nicotine gum, nicotine patches, a nicotine inhaler, or nicotine nasal spray can help with physical craving. Hypnosis, support groups, and medicines help break the habit of smoking. WHAT THINGS CAN I DO TO MAKE QUITTING EASIER?  Here are some tips to help you quit for good:  Pick a date when you will quit smoking completely. Tell all of your friends and family about your plan to quit on that date.  Do not try to slowly cut down on the number of cigarettes you are smoking. Pick a quit date and quit smoking completely starting on that day.  Throw away all cigarettes.   Clean and remove all ashtrays from your home, work, and car.  On a card, write down your reasons for quitting. Carry the card with you and read it when you get the urge to smoke.  Cleanse your body of nicotine. Drink enough water and fluids to keep your urine clear or pale yellow. Do this after quitting to flush the nicotine from your body.  Learn to predict your moods. Do not let a bad situation be your excuse to have a cigarette. Some situations in your life might tempt you into wanting a cigarette.  Never have "just one" cigarette. It leads to wanting another and another. Remind yourself of your decision to quit.  Change habits associated with smoking. If you smoked while driving or when feeling stressed, try other activities to replace smoking. Stand up when drinking your coffee. Brush your teeth after eating. Sit in a different chair when you read the paper. Avoid alcohol while trying to quit, and try to drink fewer caffeinated beverages. Alcohol and caffeine may urge you to  smoke.  Avoid foods and drinks that can trigger a desire to smoke, such as sugary or spicy foods and alcohol.  Ask people who smoke not to smoke around you.  Have something planned to do right after eating or having a cup of coffee. For example, plan to take a walk or exercise.  Try a relaxation exercise to calm you down and decrease your stress. Remember, you may be tense and nervous for the first 2 weeks after you quit, but this will pass.  Find new activities to keep your hands busy. Play with a pen, coin, or rubber band. Doodle or draw things on paper.  Brush your teeth right after eating. This will help cut down on the craving for the taste of tobacco after meals. You can also try mouthwash.   Use oral substitutes in place of cigarettes. Try using lemon drops, carrots, cinnamon sticks, or chewing gum. Keep them handy so they are available when you have the urge to smoke.  When you have the urge to smoke, try deep breathing.  Designate your home as a nonsmoking area.  If you are a heavy smoker, ask your health care provider about a prescription for nicotine chewing gum. It can ease your withdrawal from nicotine.  Reward yourself. Set aside the cigarette money you save and buy yourself something nice.  Look for support from others. Join a support group or smoking cessation program. Ask someone at home or at work to help you with your plan   to quit smoking.  Always ask yourself, "Do I need this cigarette or is this just a reflex?" Tell yourself, "Today, I choose not to smoke," or "I do not want to smoke." You are reminding yourself of your decision to quit.  Do not replace cigarette smoking with electronic cigarettes (commonly called e-cigarettes). The safety of e-cigarettes is unknown, and some may contain harmful chemicals.  If you relapse, do not give up! Plan ahead and think about what you will do the next time you get the urge to smoke. HOW WILL I FEEL WHEN I QUIT SMOKING? You  may have symptoms of withdrawal because your body is used to nicotine (the addictive substance in cigarettes). You may crave cigarettes, be irritable, feel very hungry, cough often, get headaches, or have difficulty concentrating. The withdrawal symptoms are only temporary. They are strongest when you first quit but will go away within 10-14 days. When withdrawal symptoms occur, stay in control. Think about your reasons for quitting. Remind yourself that these are signs that your body is healing and getting used to being without cigarettes. Remember that withdrawal symptoms are easier to treat than the major diseases that smoking can cause.  Even after the withdrawal is over, expect periodic urges to smoke. However, these cravings are generally short lived and will go away whether you smoke or not. Do not smoke! WHAT RESOURCES ARE AVAILABLE TO HELP ME QUIT SMOKING? Your health care provider can direct you to community resources or hospitals for support, which may include:  Group support.  Education.  Hypnosis.  Therapy.   This information is not intended to replace advice given to you by your health care provider. Make sure you discuss any questions you have with your health care provider.   Document Released: 01/04/2004 Document Revised: 04/28/2014 Document Reviewed: 09/23/2012 Elsevier Interactive Patient Education 2016 [employer]  

## 2015-07-03 NOTE — Progress Notes (Signed)
[facility] [phone]   Fax:([phone]) [place], [place] [address]  DIAGNOSIS: Metastatic non-small cell lung cancer initially diagnosed as Unresectable a stage IIA (T1a, N1, M0) non-small cell lung cancer, invasive poorly differentiated carcinoma diagnosed in March 2016  PRIOR THERAPY:  1) A course of concurrent chemoradiation with weekly carboplatin for AUC of 2 and paclitaxel 45 MG/M2. 2) concurrent chemoradiation with weekly carboplatin for AUC of 2 and paclitaxel 45 mg/m2 resumed on 10/09/14. Completed his last dose on 10/24/14. 3) Systemic chemotherapy with carboplatin for AUC of 5 on day 1 and gemcitabine 1000 MG/M2 on days 1 and 8 every 3 weeks. First dose on 12/12/2014. Status post 7 cycles. Last dose was given 04/24/2015. This was discontinued secondary to disease progression  CURRENT THERAPY: Nivolumab 240 MG IV every 2 weeks, first dose 05/22/2015. Status post 3 cycles.  INTERVAL HISTORY: Adam Warner 60 y.o. male returns to the clinic today for follow-up visit. The patient continues to complain of fatigue and weakness as well as lack of appetite and more weight loss. He was seen by the dietitian at the [facility] and currently has supplements at home but he doesn't drink much of it. The patient related the first 3 cycles of his immunotherapy with Nivolumab fairly well. He denied having any significant chest pain, but has shortness breath with exertion with no cough or hemoptysis. He denied having any significant nausea or vomiting, no fever or chills. He is here today to start cycle #4 of his immunotherapy.  MEDICAL HISTORY: Past Medical History  Diagnosis Date  . COPD (chronic obstructive pulmonary disease) ([facility])   . Stroke ([place])   . Hypertension   . Heart attack ([facility]) 1991  . Pulmonary nodule   . Coronary artery disease     sees Dr Spero Curb at [facility] every 2-3 years  . Shortness  of breath dyspnea   . Full dentures   . Diabetes mellitus     Type Niddm x 2 years  . DNR no code (do not resuscitate) 01/23/2015  . DNR no code (do not resuscitate) 01/23/2015  . Encounter for antineoplastic immunotherapy 06/05/2015    ALLERGIES:  is allergic to zyban.  MEDICATIONS:  Current Outpatient Prescriptions  Medication Sig Dispense Refill  . ACCU-CHEK AVIVA PLUS test strip USE TO CHECK ONCE OR TWICE DAILY 100 each 0  . ACCU-CHEK FASTCLIX LANCETS MISC Check blood sugar bid and prn 102 each 0  . aspirin EC 325 MG tablet Take 325 mg by mouth every morning. Reported on 04/17/2015    . atorvastatin (LIPITOR) 40 MG tablet TAKE ONE TABLET BY MOUTH ONE TIME DAILY 30 tablet 6  . Blood Glucose Monitoring Suppl (ONE TOUCH ULTRA 2) W/DEVICE KIT Use monitor to test blood sugar qd Dx 250.00 1 each 0  . Cholecalciferol (VITAMIN D) 2000 UNITS CAPS Take 1 capsule by mouth daily.    . feeding supplement, GLUCERNA SHAKE, (GLUCERNA SHAKE) LIQD Take 237 mLs by mouth 2 (two) times daily between meals. 60 Can 5  . metFORMIN (GLUCOPHAGE) 500 MG tablet TAKE ONE TABLET BY MOUTH TWICE A DAY WITH MEALS 60 tablet 5  . OVER THE COUNTER MEDICATION Take 1 tablet by mouth daily as needed.    Marland Kitchen oxyCODONE-acetaminophen (PERCOCET/ROXICET) 5-325 MG tablet Take 1 tablet by mouth every 6 (six) hours as needed for moderate pain or severe pain. 30 tablet 0  .  tiotropium (SPIRIVA) 18 MCG inhalation capsule Place 1 capsule (18 mcg total) into inhaler and inhale daily. 30 capsule 6  . prochlorperazine (COMPAZINE) 10 MG tablet Take 1 tablet (10 mg total) by mouth every 6 (six) hours as needed for nausea or vomiting. (Patient not taking: Reported on 05/15/2015) 30 tablet 0   No current facility-administered medications for this visit.    SURGICAL HISTORY:  Past Surgical History  Procedure Laterality Date  . Lung surgery      Dr Arlyce Dice  . Angioplasty    . Colonoscopy  10/22/2011    Procedure: COLONOSCOPY;  Surgeon: Rogene Houston, MD;  Location: [facility];  Service: Endoscopy;  Laterality: N/A;  1200  . Carotid endarterectomy Right   . Video bronchoscopy with endobronchial ultrasound N/A 05/29/2014    Procedure: VIDEO BRONCHOSCOPY WITH ENDOBRONCHIAL ULTRASOUND;  Surgeon: Melrose Nakayama, MD;  Location: [facility];  Service: Thoracic;  Laterality: N/A;    REVIEW OF SYSTEMS:  A comprehensive review of systems was negative except for: Constitutional: positive for anorexia, fatigue and weight loss Respiratory: positive for dyspnea on exertion Musculoskeletal: positive for back pain   PHYSICAL EXAMINATION: General appearance: alert, cooperative and no distress Head: Normocephalic, without obvious abnormality, atraumatic Neck: no adenopathy, no JVD, supple, symmetrical, trachea midline and thyroid not enlarged, symmetric, no tenderness/mass/nodules Lymph nodes: Cervical, supraclavicular, and axillary nodes normal. Resp: clear to auscultation bilaterally Back: symmetric, no curvature. ROM normal. No CVA tenderness. Cardio: regular rate and rhythm, S1, S2 normal, no murmur, click, rub or gallop GI: soft, non-tender; bowel sounds normal; no masses,  no organomegaly Extremities: extremities normal, atraumatic, no cyanosis or edema Neurologic: Alert and oriented X 3, normal strength and tone. Normal symmetric reflexes. Normal coordination and gait  ECOG PERFORMANCE STATUS: 1 - Symptomatic but completely ambulatory  Blood pressure 92/64, pulse 133, temperature 98.1 F (36.7 C), temperature source Oral, resp. rate 18, height _0  (1.651 m), weight 94 lb 8 oz (42.865 kg), SpO2 94 %.  LABORATORY DATA: Lab Results  Component Value Date   WBC 9.2 07/03/2015   HGB 9.3* 07/03/2015   HCT 29.4* 07/03/2015   MCV 86.2 07/03/2015   PLT 166 07/03/2015      Chemistry      Component Value Date/Time   NA 132* 06/19/2015 0942   NA 136 05/23/2014 1015   NA 139 04/12/2014 1029   K 4.0 06/19/2015 0942   K 4.1  05/23/2014 1015   CL 106 05/23/2014 1015   CO2 22 06/19/2015 0942   CO2 25 05/23/2014 1015   BUN 14.0 06/19/2015 0942   BUN 10 05/23/2014 1015   BUN 12 04/12/2014 1029   CREATININE 0.9 06/19/2015 0942   CREATININE 1.07 05/23/2014 1015   CREATININE 1.05 11/04/2012 1242      Component Value Date/Time   CALCIUM 9.2 06/19/2015 0942   CALCIUM 9.3 05/23/2014 1015   ALKPHOS 116 06/19/2015 0942   ALKPHOS 90 05/23/2014 1015   AST 10 06/19/2015 0942   AST 19 05/23/2014 1015   ALT <9 06/19/2015 0942   ALT 17 05/23/2014 1015   BILITOT 0.57 06/19/2015 0942   BILITOT 0.6 05/23/2014 1015       RADIOGRAPHIC STUDIES: No results found.  ASSESSMENT AND PLAN: This is a very pleasant 60years old white male with history of stage IIA non-small cell lung cancer status post a course of concurrent chemoradiation with weekly carboplatin and paclitaxel. The recent CT scan of the chest showed interval decrease  in the size of the left hilar mass and adenopathy. He is currently undergoing systemic chemotherapy with carboplatin and gemcitabine status post 7 ycles He tolerated the treatment well except for fatigue as well as lack of appetite and weight loss. He also has low back pain. Unfortunately the recent CT scan of the chest, abdomen and pelvis showed evidence for disease progression with new right adrenal gland nodule in addition to large right pleural effusion. He is currently undergoing treatment with immunotherapy with Nivolumab status post 3 cycles. He is tolerating his treatment well. I recommended for the patient to proceed with cycle #4 today as a scheduled. The patient would come back for follow-up visit in 2 weeks for reevaluation with repeat CT scan of the chest, abdomen and pelvis for restaging of his disease before starting cycle #5 of his treatment. He would continue on Percocet for the back pain and I gave him a refill of his medication.. For smoke cessation, strongly advised the patient to  quit smoking and offered him a smoke cessation program. CODE STATUS: no CODE BLUE He was advised to call immediately if he has any concerning symptoms in the interval. The patient voices understanding of current disease status and treatment options and is in agreement with the current care plan.  All questions were answered. The patient knows to call the clinic with any problems, questions or concerns. We can certainly see the patient much sooner if necessary.  Disclaimer: This note was dictated with voice recognition software. Similar sounding words can inadvertently be transcribed and may not be corrected upon review.

## 2015-07-03 NOTE — Patient Instructions (Signed)
[facility] Discharge Instructions for Patients Receiving Chemotherapy  Today you received the following chemotherapy agents Nivolumab.  To help prevent nausea and vomiting after your treatment, we encourage you to take your nausea medication as prescribed.   If you develop nausea and vomiting that is not controlled by your nausea medication, call the clinic.   BELOW ARE SYMPTOMS THAT SHOULD BE REPORTED IMMEDIATELY:  *FEVER GREATER THAN 100.5 F  *CHILLS WITH OR WITHOUT FEVER  NAUSEA AND VOMITING THAT IS NOT CONTROLLED WITH YOUR NAUSEA MEDICATION  *UNUSUAL SHORTNESS OF BREATH  *UNUSUAL BRUISING OR BLEEDING  TENDERNESS IN MOUTH AND THROAT WITH OR WITHOUT PRESENCE OF ULCERS  *URINARY PROBLEMS  *BOWEL PROBLEMS  UNUSUAL RASH Items with * indicate a potential emergency and should be followed up as soon as possible.  Feel free to call the clinic you have any questions or concerns. The clinic phone number is [phone].  Please show the CHEMO ALERT CARD at check-in to the Emergency Department and triage nurse.   

## 2015-07-03 NOTE — Telephone Encounter (Signed)
Per staff message and POF I have scheduled appts. Advised scheduler of appts. JMW  

## 2015-07-16 ENCOUNTER — Ambulatory Visit (HOSPITAL_COMMUNITY): Payer: Medicaid Other

## 2015-07-17 ENCOUNTER — Ambulatory Visit: Payer: Medicaid Other | Admitting: Internal Medicine

## 2015-07-17 ENCOUNTER — Ambulatory Visit: Payer: Medicaid Other

## 2015-07-17 ENCOUNTER — Other Ambulatory Visit: Payer: Medicaid Other

## 2015-07-21 DEATH — deceased

## 2015-07-31 ENCOUNTER — Ambulatory Visit: Payer: Medicaid Other

## 2015-07-31 ENCOUNTER — Ambulatory Visit: Payer: Medicaid Other | Admitting: Internal Medicine

## 2015-07-31 ENCOUNTER — Other Ambulatory Visit: Payer: Medicaid Other

## 2015-08-09 ENCOUNTER — Ambulatory Visit: Payer: Medicaid Other | Admitting: Family Medicine

## 2015-08-14 ENCOUNTER — Ambulatory Visit: Payer: Medicaid Other | Admitting: Internal Medicine

## 2015-08-14 ENCOUNTER — Other Ambulatory Visit: Payer: Medicaid Other

## 2015-08-14 ENCOUNTER — Ambulatory Visit: Payer: Medicaid Other

## 2015-10-08 ENCOUNTER — Other Ambulatory Visit: Payer: Self-pay | Admitting: Nurse Practitioner

## 2015-10-09 ENCOUNTER — Other Ambulatory Visit: Payer: Self-pay | Admitting: Nurse Practitioner

## 2015-11-01 IMAGING — CT NM PET TUM IMG INITIAL (PI) SKULL BASE T - THIGH
1 of 7 series · 1 of 25 positions shown · non-contrast
Comparison: 05/13/2013

CLINICAL DATA: Initial treatment strategy for Lung cancer.

EXAM:
NUCLEAR MEDICINE PET SKULL BASE TO THIGH
TECHNIQUE: 6.2 mCi F-18 FDG was injected intravenously. Full-ring PET imaging
was performed from the skull base to thigh after the radiotracer. CT
data was obtained and used for attenuation correction and anatomic
localization.
FASTING BLOOD GLUCOSE:  Value: 147 mg/dl

[Series 4: ct sk_thigh 5.0 b31f · axial · 5.0mm · 0.77mm/px · 1 of 214 slices shown]
[im 214/214  brain]
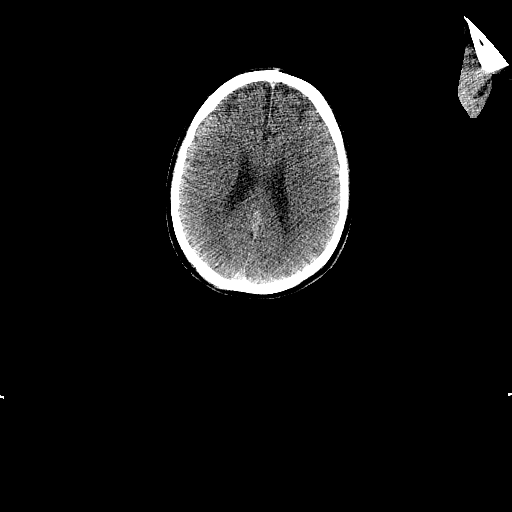

[1 of 25 positions shown; findings below may reference images not displayed]

FINDINGS: NECK

No hypermetabolic lymph nodes in the neck.

CHEST

Within the right upper lobe there is a hypermetabolic spiculated
nodule measuring 9 mm. This has an SUV max within the malignant
range at 2.7. On the exam from 05/13/2013 this lesion measured
and had an SUV max equal to 5.1. Anterior right upper lobe nodule is
stable measuring 6 mm, image 24/series 6. The 5 mm right apical
nodule is stable, image 17/series 6.

Enlarged right hilar lymph node measures 3.2 cm and has an SUV max
equal to 10.8. Previously this measured 1.3 cm and had an SUV max
equal to 4.0. No hypermetabolic mediastinal or contralateral hilar
lymph nodes.

There is atherosclerotic disease involving the thoracic aorta as
well as the LAD and RCA Coronary arteries.

ABDOMEN/PELVIS

No abnormal hypermetabolic activity within the liver, pancreas,
adrenal glands, or spleen. No hypermetabolic lymph nodes in the
abdomen or pelvis. Small stones are identified within the right
renal collecting system. There is calcified atherosclerotic disease
involving the abdominal aorta. No aneurysm.

SKELETON

No focal hypermetabolic activity to suggest skeletal metastasis.
IMPRESSION: 1. Persistent, hypermetabolic nodule within the right upper lobe is
identified and is concerning for primary bronchogenic carcinoma.
2. Enlarged and hypermetabolic right hilar lymph node. When compared
with previous exam the size of this lymph node and the degree of FDG
uptake has increased in the interval and is highly worrisome for
metastatic adenopathy.
3. No evidence for distant metastatic disease.
4. Smaller nodules within the right upper lobe are unchanged from
05/13/2013 in remain 2 smaller reliably characterize by PET-CT.
